# Patient Record
Sex: Male | Born: 1950 | Race: White | Hispanic: No | Marital: Married | Smoking: Never smoker
Health system: Southern US, Community
[De-identification: ages and names within clinical notes are randomized; demographics above are authoritative.]

## PROBLEM LIST (undated history)

## (undated) DIAGNOSIS — M109 Gout, unspecified: Secondary | ICD-10-CM

## (undated) DIAGNOSIS — C801 Malignant (primary) neoplasm, unspecified: Secondary | ICD-10-CM

## (undated) DIAGNOSIS — H269 Unspecified cataract: Secondary | ICD-10-CM

## (undated) DIAGNOSIS — T783XXA Angioneurotic edema, initial encounter: Secondary | ICD-10-CM

## (undated) DIAGNOSIS — I10 Essential (primary) hypertension: Secondary | ICD-10-CM

## (undated) DIAGNOSIS — T7840XA Allergy, unspecified, initial encounter: Secondary | ICD-10-CM

## (undated) DIAGNOSIS — D126 Benign neoplasm of colon, unspecified: Secondary | ICD-10-CM

## (undated) DIAGNOSIS — R7302 Impaired glucose tolerance (oral): Secondary | ICD-10-CM

## (undated) DIAGNOSIS — L509 Urticaria, unspecified: Secondary | ICD-10-CM

## (undated) DIAGNOSIS — E785 Hyperlipidemia, unspecified: Secondary | ICD-10-CM

## (undated) DIAGNOSIS — C4492 Squamous cell carcinoma of skin, unspecified: Secondary | ICD-10-CM

## (undated) DIAGNOSIS — K648 Other hemorrhoids: Secondary | ICD-10-CM

## (undated) DIAGNOSIS — G500 Trigeminal neuralgia: Secondary | ICD-10-CM

## (undated) DIAGNOSIS — K5790 Diverticulosis of intestine, part unspecified, without perforation or abscess without bleeding: Secondary | ICD-10-CM

## (undated) DIAGNOSIS — E119 Type 2 diabetes mellitus without complications: Secondary | ICD-10-CM

## (undated) DIAGNOSIS — B001 Herpesviral vesicular dermatitis: Secondary | ICD-10-CM

## (undated) HISTORY — DX: Unspecified cataract: H26.9

## (undated) HISTORY — DX: Essential (primary) hypertension: I10

## (undated) HISTORY — DX: Squamous cell carcinoma of skin, unspecified: C44.92

## (undated) HISTORY — DX: Hyperlipidemia, unspecified: E78.5

## (undated) HISTORY — DX: Allergy, unspecified, initial encounter: T78.40XA

## (undated) HISTORY — DX: Impaired glucose tolerance (oral): R73.02

## (undated) HISTORY — DX: Urticaria, unspecified: L50.9

## (undated) HISTORY — DX: Diverticulosis of intestine, part unspecified, without perforation or abscess without bleeding: K57.90

## (undated) HISTORY — DX: Other hemorrhoids: K64.8

## (undated) HISTORY — DX: Angioneurotic edema, initial encounter: T78.3XXA

## (undated) HISTORY — DX: Herpesviral vesicular dermatitis: B00.1

## (undated) HISTORY — DX: Trigeminal neuralgia: G50.0

## (undated) HISTORY — DX: Type 2 diabetes mellitus without complications: E11.9

## (undated) HISTORY — DX: Gout, unspecified: M10.9

## (undated) HISTORY — DX: Benign neoplasm of colon, unspecified: D12.6

## (undated) HISTORY — DX: Malignant (primary) neoplasm, unspecified: C80.1

---

## 2006-04-01 ENCOUNTER — Ambulatory Visit: Payer: Self-pay | Admitting: Family Medicine

## 2006-09-01 ENCOUNTER — Ambulatory Visit: Payer: Self-pay | Admitting: Family Medicine

## 2006-09-01 LAB — CONVERTED CEMR LAB
Basophils Absolute: 0 10*3/uL (ref 0.0–0.1)
Cholesterol: 219 mg/dL (ref 0–200)
HCT: 43.9 % (ref 39.0–52.0)
Hemoglobin: 15.3 g/dL (ref 13.0–17.0)
Lymphocytes Relative: 36.6 % (ref 12.0–46.0)
MCHC: 34.9 g/dL (ref 30.0–36.0)
Monocytes Absolute: 0.7 10*3/uL (ref 0.2–0.7)
Neutro Abs: 2.6 10*3/uL (ref 1.4–7.7)
Neutrophils Relative %: 47.3 % (ref 43.0–77.0)
RDW: 11.9 % (ref 11.5–14.6)
Total CHOL/HDL Ratio: 5.4

## 2007-03-08 ENCOUNTER — Ambulatory Visit: Payer: Self-pay | Admitting: Family Medicine

## 2007-03-14 LAB — CONVERTED CEMR LAB
Albumin: 4.5 g/dL (ref 3.5–5.2)
Basophils Absolute: 0 10*3/uL (ref 0.0–0.1)
Basophils Relative: 0.3 % (ref 0.0–1.0)
Basophils Relative: 0.3 % (ref 0.0–1.0)
Cholesterol: 220 mg/dL (ref 0–200)
Cholesterol: 220 mg/dL (ref 0–200)
Direct LDL: 129 mg/dL
Eosinophils Absolute: 0.1 10*3/uL (ref 0.0–0.6)
Glucose, Bld: 125 mg/dL — ABNORMAL HIGH (ref 70–99)
Glucose, Bld: 125 mg/dL — ABNORMAL HIGH (ref 70–99)
HDL: 45.9 mg/dL (ref >39.0)
Hemoglobin: 14.8 g/dL (ref 13.0–17.0)
Hemoglobin: 14.8 g/dL (ref 13.0–17.0)
Hgb A1c MFr Bld: 5.6 % (ref 4.6–6.0)
Hgb A1c MFr Bld: 5.6 % (ref 4.6–6.0)
Lymphocytes Relative: 33.6 % (ref 12.0–46.0)
MCHC: 34.6 g/dL (ref 30.0–36.0)
MCV: 95.6 fL (ref 78.0–100.0)
Monocytes Absolute: 0.6 10*3/uL (ref 0.2–0.7)
Monocytes Relative: 12.4 % — ABNORMAL HIGH (ref 3.0–11.0)
Monocytes Relative: 12.4 % — ABNORMAL HIGH (ref 3.0–11.0)
Neutro Abs: 2.6 10*3/uL (ref 1.4–7.7)
Platelets: 171 10*3/uL (ref 150–400)
RDW: 11.5 % (ref 11.5–14.6)
Total Bilirubin: 1.1 mg/dL (ref 0.3–1.2)
Total CHOL/HDL Ratio: 4.8
VLDL: 36 mg/dL (ref 0–40)
WBC: 4.9 10*3/uL (ref 4.5–10.5)

## 2007-04-18 ENCOUNTER — Encounter: Payer: Self-pay | Admitting: Family Medicine

## 2007-06-07 ENCOUNTER — Encounter: Payer: Self-pay | Admitting: Family Medicine

## 2007-06-07 ENCOUNTER — Ambulatory Visit: Payer: Self-pay

## 2008-03-21 ENCOUNTER — Ambulatory Visit: Payer: Self-pay | Admitting: Family Medicine

## 2008-03-21 LAB — CONVERTED CEMR LAB
Glucose, Urine, Semiquant: NEGATIVE
WBC Urine, dipstick: NEGATIVE
pH: 6.5

## 2008-03-22 LAB — CONVERTED CEMR LAB
ALT: 41 units/L (ref 0–53)
BUN: 16 mg/dL (ref 6–23)
Basophils Relative: 0.8 % (ref 0.0–3.0)
CO2: 28 meq/L (ref 19–32)
Calcium: 9 mg/dL (ref 8.4–10.5)
Creatinine, Ser: 0.8 mg/dL (ref 0.4–1.5)
Creatinine,U: 255.4 mg/dL
Eosinophils Relative: 3.3 % (ref 0.0–5.0)
Glucose, Bld: 131 mg/dL — ABNORMAL HIGH (ref 70–99)
Hemoglobin: 15 g/dL (ref 13.0–17.0)
Lymphocytes Relative: 33.3 % (ref 12.0–46.0)
MCHC: 34.5 g/dL (ref 30.0–36.0)
Microalb, Ur: 4.4 mg/dL — ABNORMAL HIGH (ref 0.0–1.9)
Monocytes Relative: 12.2 % — ABNORMAL HIGH (ref 3.0–12.0)
Neutro Abs: 2.3 10*3/uL (ref 1.4–7.7)
RBC: 4.44 M/uL (ref 4.22–5.81)
Total CHOL/HDL Ratio: 5.2
Total Protein: 6.8 g/dL (ref 6.0–8.3)
VLDL: 49 mg/dL — ABNORMAL HIGH (ref 0–40)
Vit D, 1,25-Dihydroxy: 22 — ABNORMAL LOW (ref 30–89)

## 2008-07-11 ENCOUNTER — Telehealth: Payer: Self-pay | Admitting: Family Medicine

## 2008-07-11 DIAGNOSIS — S298XXA Other specified injuries of thorax, initial encounter: Secondary | ICD-10-CM | POA: Insufficient documentation

## 2008-07-24 ENCOUNTER — Ambulatory Visit (HOSPITAL_COMMUNITY): Admission: RE | Admit: 2008-07-24 | Discharge: 2008-07-24 | Payer: Self-pay | Admitting: Family Medicine

## 2008-07-30 ENCOUNTER — Ambulatory Visit: Payer: Self-pay | Admitting: Family Medicine

## 2009-07-22 ENCOUNTER — Ambulatory Visit: Payer: Self-pay | Admitting: Family Medicine

## 2009-07-22 LAB — CONVERTED CEMR LAB
Bilirubin Urine: NEGATIVE
Blood in Urine, dipstick: NEGATIVE
Glucose, Urine, Semiquant: NEGATIVE
Protein, U semiquant: NEGATIVE
Specific Gravity, Urine: 1.01
pH: 6

## 2009-07-23 LAB — CONVERTED CEMR LAB
BUN: 19 mg/dL (ref 6–23)
Basophils Absolute: 0 10*3/uL (ref 0.0–0.1)
Bilirubin, Direct: 0 mg/dL (ref 0.0–0.3)
Chloride: 103 meq/L (ref 96–112)
Cholesterol: 228 mg/dL — ABNORMAL HIGH (ref 0–200)
Creatinine, Ser: 0.9 mg/dL (ref 0.4–1.5)
Direct LDL: 123.1 mg/dL
Eosinophils Absolute: 0.1 10*3/uL (ref 0.0–0.7)
Eosinophils Relative: 2.9 % (ref 0.0–5.0)
Glucose, Bld: 125 mg/dL — ABNORMAL HIGH (ref 70–99)
HCT: 44.3 % (ref 39.0–52.0)
Hgb A1c MFr Bld: 6 % (ref 4.6–6.5)
Lymphs Abs: 1.6 10*3/uL (ref 0.7–4.0)
MCHC: 33.6 g/dL (ref 30.0–36.0)
MCV: 98.7 fL (ref 78.0–100.0)
Monocytes Absolute: 0.5 10*3/uL (ref 0.1–1.0)
Neutrophils Relative %: 46.7 % (ref 43.0–77.0)
PSA: 0.58 ng/mL (ref 0.10–4.00)
Platelets: 161 10*3/uL (ref 150.0–400.0)
RDW: 12.3 % (ref 11.5–14.6)
TSH: 0.96 microintl units/mL (ref 0.35–5.50)
Total Bilirubin: 1 mg/dL (ref 0.3–1.2)
VLDL: 64.8 mg/dL — ABNORMAL HIGH (ref 0.0–40.0)
WBC: 4.1 10*3/uL — ABNORMAL LOW (ref 4.5–10.5)

## 2010-10-22 ENCOUNTER — Encounter: Payer: Self-pay | Admitting: Gastroenterology

## 2010-10-22 ENCOUNTER — Other Ambulatory Visit (INDEPENDENT_AMBULATORY_CARE_PROVIDER_SITE_OTHER): Payer: Federal, State, Local not specified - PPO | Admitting: Family Medicine

## 2010-10-22 DIAGNOSIS — Z0000 Encounter for general adult medical examination without abnormal findings: Secondary | ICD-10-CM

## 2010-10-22 LAB — BASIC METABOLIC PANEL
Calcium: 9 mg/dL (ref 8.4–10.5)
GFR: 90.73 mL/min (ref >60.00)
Glucose, Bld: 110 mg/dL — ABNORMAL HIGH (ref 70–99)
Potassium: 4.5 mEq/L (ref 3.5–5.1)
Sodium: 135 mEq/L (ref 135–145)

## 2010-10-22 LAB — CBC WITH DIFFERENTIAL/PLATELET
Basophils Absolute: 0 10*3/uL (ref 0.0–0.1)
Eosinophils Relative: 2.4 % (ref 0.0–5.0)
HCT: 42.9 % (ref 39.0–52.0)
Hemoglobin: 14.9 g/dL (ref 13.0–17.0)
Lymphocytes Relative: 28 % (ref 12.0–46.0)
Lymphs Abs: 1.6 10*3/uL (ref 0.7–4.0)
Monocytes Relative: 12.7 % — ABNORMAL HIGH (ref 3.0–12.0)
Neutro Abs: 3.3 10*3/uL (ref 1.4–7.7)
RDW: 12.4 % (ref 11.5–14.6)
WBC: 5.9 10*3/uL (ref 4.5–10.5)

## 2010-10-22 LAB — POCT URINALYSIS DIPSTICK
Glucose, UA: NEGATIVE
Leukocytes, UA: NEGATIVE
Nitrite, UA: NEGATIVE
Protein, UA: NEGATIVE
Urobilinogen, UA: 0.2

## 2010-10-22 LAB — HEMOGLOBIN A1C: Hgb A1c MFr Bld: 6 % (ref 4.6–6.5)

## 2010-10-22 LAB — HEPATIC FUNCTION PANEL
ALT: 25 U/L (ref 0–53)
AST: 21 U/L (ref 0–37)
Albumin: 4.4 g/dL (ref 3.5–5.2)
Alkaline Phosphatase: 39 U/L (ref 39–117)
Total Bilirubin: 0.6 mg/dL (ref 0.3–1.2)

## 2010-10-22 LAB — LIPID PANEL
LDL Cholesterol: 114 mg/dL — ABNORMAL HIGH (ref 0–99)
VLDL: 37 mg/dL (ref 0.0–40.0)

## 2010-10-22 LAB — PSA: PSA: 0.55 ng/mL (ref 0.10–4.00)

## 2010-10-26 ENCOUNTER — Encounter (INDEPENDENT_AMBULATORY_CARE_PROVIDER_SITE_OTHER): Payer: Self-pay | Admitting: *Deleted

## 2010-10-29 NOTE — Letter (Signed)
Summary: Colonoscopy Letter   Gastroenterology  [address] [place], [place] [address]   Phone: [phone]  Fax: [phone]      October 22, 2010 MRN: [id number]   Brett Johnson [address] [place], [place]  [address]   Dear Mr. DANGERFIELD,   According to your medical record, it is time for you to schedule a Colonoscopy. The American Cancer Society recommends this procedure as a method to detect early colon cancer. Patients with a family history of colon cancer, or a personal history of colon polyps or inflammatory bowel disease are at increased risk.  This letter has been generated based on the recommendations made at the time of your procedure. If you feel that in your particular situation this may no longer apply, please contact our office.  Please call our office at [phone] to schedule this appointment or to update your records at your earliest convenience.  Thank you for cooperating with [place] to provide you with the very best care possible.   Sincerely,  Judie Petit T. Russella Dar, M.D.  [facility] Gastroenterology Division [phone]

## 2010-11-01 HISTORY — PX: COLONOSCOPY: SHX174

## 2010-11-03 ENCOUNTER — Ambulatory Visit (AMBULATORY_SURGERY_CENTER): Payer: Federal, State, Local not specified - PPO

## 2010-11-03 VITALS — Ht 69.0 in | Wt 192.4 lb

## 2010-11-03 DIAGNOSIS — Z1211 Encounter for screening for malignant neoplasm of colon: Secondary | ICD-10-CM

## 2010-11-03 MED ORDER — PEG-KCL-NACL-NASULF-NA ASC-C 100 G PO SOLR: 1.0000 | Freq: Once | ORAL | Status: AC

## 2010-11-03 NOTE — Letter (Signed)
Summary: Pre Visit Letter Revised  [facility]  [address] [place], [place] [address]   Phone: [phone]  Fax: [phone]        10/26/2010 MRN: [id number] Brett Johnson [address] [place], [place]  [address]  [place]             Procedure Date:  11-17-10           Recall Colon---Dr. Russella Dar   Welcome to the Gastroenterology Division at [facility].    You are scheduled to see a nurse for your pre-procedure visit on 11-03-10 at 4:30p.m. on the 3rd floor at [facility], [address]  We ask that you try to arrive at our office 15 minutes prior to your appointment time to allow for check-in.  Please take a minute to review the attached form.  If you answer "Yes" to one or more of the questions on the first page, we ask that you call the person listed at your earliest opportunity.  If you answer "No" to all of the questions, please complete the rest of the form and bring it to your appointment.    Your nurse visit will consist of discussing your medical and surgical history, your immediate family medical history, and your medications.   If you are unable to list all of your medications on the form, please bring the medication bottles to your appointment and we will list them.  We will need to be aware of both prescribed and over the counter drugs.  We will need to know exact dosage information as well.    Please be prepared to read and sign documents such as consent forms, a financial agreement, and acknowledgement forms.  If necessary, and with your consent, a friend or relative is welcome to sit-in on the nurse visit with you.  Please bring your insurance card so that we may make a copy of it.  If your insurance requires a referral to see a specialist, please bring your referral form from your primary care physician.  No co-pay is required for this nurse visit.     If you cannot keep your appointment, please call [phone] to cancel or reschedule  prior to your appointment date.  This allows [place] the opportunity to schedule an appointment for another patient in need of care.    Thank you for choosing [facility] for your medical needs.  We appreciate the opportunity to care for you.  Please visit [place] at our website  to learn more about our practice.  Sincerely, The Gastroenterology Division

## 2010-11-16 ENCOUNTER — Telehealth: Payer: Self-pay

## 2010-11-16 DIAGNOSIS — E785 Hyperlipidemia, unspecified: Secondary | ICD-10-CM

## 2010-11-16 NOTE — Telephone Encounter (Signed)
Opened in error

## 2010-11-17 ENCOUNTER — Ambulatory Visit (AMBULATORY_SURGERY_CENTER): Payer: Federal, State, Local not specified - PPO | Admitting: Gastroenterology

## 2010-11-17 ENCOUNTER — Encounter: Payer: Self-pay | Admitting: Gastroenterology

## 2010-11-17 ENCOUNTER — Other Ambulatory Visit: Payer: Federal, State, Local not specified - PPO | Admitting: Gastroenterology

## 2010-11-17 VITALS — BP 129/74 | HR 56 | Temp 97.8°F | Resp 18 | Ht 69.0 in | Wt 192.0 lb

## 2010-11-17 DIAGNOSIS — Z1211 Encounter for screening for malignant neoplasm of colon: Secondary | ICD-10-CM

## 2010-11-17 DIAGNOSIS — K5730 Diverticulosis of large intestine without perforation or abscess without bleeding: Secondary | ICD-10-CM

## 2010-11-17 MED ORDER — SODIUM CHLORIDE 0.9 % IV SOLN: 500.0000 mL | INTRAVENOUS | Status: DC

## 2010-11-17 NOTE — Patient Instructions (Signed)
Follow disharge instructions. Continue with your previous medications. Increase your dietary fiber intake with liberal fluid intake. Next colonoscopy in 10 years.

## 2010-11-18 ENCOUNTER — Telehealth: Payer: Self-pay | Admitting: *Deleted

## 2010-11-18 NOTE — Telephone Encounter (Signed)

## 2011-10-28 ENCOUNTER — Ambulatory Visit: Payer: Self-pay | Admitting: Family Medicine

## 2011-12-01 ENCOUNTER — Other Ambulatory Visit (INDEPENDENT_AMBULATORY_CARE_PROVIDER_SITE_OTHER): Payer: Federal, State, Local not specified - PPO

## 2011-12-01 DIAGNOSIS — Z0000 Encounter for general adult medical examination without abnormal findings: Secondary | ICD-10-CM

## 2011-12-01 LAB — POCT URINALYSIS DIPSTICK
Glucose, UA: NEGATIVE
Leukocytes, UA: NEGATIVE
Nitrite, UA: NEGATIVE
Protein, UA: NEGATIVE
Spec Grav, UA: 1.02
Urobilinogen, UA: 0.2

## 2011-12-01 LAB — BASIC METABOLIC PANEL
BUN: 20 mg/dL (ref 6–23)
CO2: 25 mEq/L (ref 19–32)
Calcium: 8.7 mg/dL (ref 8.4–10.5)
Creatinine, Ser: 0.8 mg/dL (ref 0.4–1.5)
GFR: 101.92 mL/min (ref >60.00)
Glucose, Bld: 108 mg/dL — ABNORMAL HIGH (ref 70–99)

## 2011-12-01 LAB — CBC WITH DIFFERENTIAL/PLATELET
Basophils Absolute: 0 10*3/uL (ref 0.0–0.1)
Eosinophils Relative: 2.4 % (ref 0.0–5.0)
HCT: 42.6 % (ref 39.0–52.0)
Hemoglobin: 14.5 g/dL (ref 13.0–17.0)
Lymphocytes Relative: 36.7 % (ref 12.0–46.0)
Lymphs Abs: 1.3 10*3/uL (ref 0.7–4.0)
Monocytes Relative: 13.7 % — ABNORMAL HIGH (ref 3.0–12.0)
Platelets: 148 10*3/uL — ABNORMAL LOW (ref 150.0–400.0)
WBC: 3.6 10*3/uL — ABNORMAL LOW (ref 4.5–10.5)

## 2011-12-01 LAB — HEPATIC FUNCTION PANEL
ALT: 117 U/L — ABNORMAL HIGH (ref 0–53)
AST: 63 U/L — ABNORMAL HIGH (ref 0–37)
Alkaline Phosphatase: 36 U/L — ABNORMAL LOW (ref 39–117)
Total Bilirubin: 0.7 mg/dL (ref 0.3–1.2)

## 2011-12-01 LAB — LIPID PANEL
HDL: 50.1 mg/dL (ref >39.00)
Total CHOL/HDL Ratio: 5
VLDL: 67 mg/dL — ABNORMAL HIGH (ref 0.0–40.0)

## 2011-12-01 LAB — HEMOGLOBIN A1C: Hgb A1c MFr Bld: 5.8 % (ref 4.6–6.5)

## 2011-12-01 LAB — PSA: PSA: 0.62 ng/mL (ref 0.10–4.00)

## 2011-12-01 LAB — TSH: TSH: 0.78 u[IU]/mL (ref 0.35–5.50)

## 2012-02-18 ENCOUNTER — Other Ambulatory Visit (INDEPENDENT_AMBULATORY_CARE_PROVIDER_SITE_OTHER): Payer: Federal, State, Local not specified - PPO

## 2012-02-18 DIAGNOSIS — E785 Hyperlipidemia, unspecified: Secondary | ICD-10-CM

## 2012-02-18 LAB — HEPATIC FUNCTION PANEL
ALT: 32 U/L (ref 0–53)
Albumin: 4.4 g/dL (ref 3.5–5.2)
Bilirubin, Direct: 0.1 mg/dL (ref 0.0–0.3)
Total Protein: 6.8 g/dL (ref 6.0–8.3)

## 2012-02-18 LAB — LDL CHOLESTEROL, DIRECT: Direct LDL: 126.5 mg/dL

## 2012-02-18 LAB — LIPID PANEL
Cholesterol: 237 mg/dL — ABNORMAL HIGH (ref 0–200)
HDL: 53 mg/dL (ref >39.00)
Triglycerides: 216 mg/dL — ABNORMAL HIGH (ref 0.0–149.0)

## 2012-05-11 ENCOUNTER — Ambulatory Visit (INDEPENDENT_AMBULATORY_CARE_PROVIDER_SITE_OTHER): Payer: Federal, State, Local not specified - PPO | Admitting: Family Medicine

## 2012-05-11 DIAGNOSIS — Z23 Encounter for immunization: Secondary | ICD-10-CM

## 2012-08-02 DIAGNOSIS — M109 Gout, unspecified: Secondary | ICD-10-CM

## 2012-08-02 HISTORY — DX: Gout, unspecified: M10.9

## 2012-11-13 ENCOUNTER — Encounter: Payer: Self-pay | Admitting: Family Medicine

## 2012-11-13 ENCOUNTER — Ambulatory Visit (INDEPENDENT_AMBULATORY_CARE_PROVIDER_SITE_OTHER): Payer: Federal, State, Local not specified - PPO | Admitting: Family Medicine

## 2012-11-13 VITALS — BP 160/85 | HR 87 | Temp 98.6°F | Resp 16 | Ht 69.5 in | Wt 196.0 lb

## 2012-11-13 DIAGNOSIS — C4491 Basal cell carcinoma of skin, unspecified: Secondary | ICD-10-CM | POA: Insufficient documentation

## 2012-11-13 DIAGNOSIS — IMO0001 Reserved for inherently not codable concepts without codable children: Secondary | ICD-10-CM

## 2012-11-13 DIAGNOSIS — R030 Elevated blood-pressure reading, without diagnosis of hypertension: Secondary | ICD-10-CM | POA: Insufficient documentation

## 2012-11-13 DIAGNOSIS — G500 Trigeminal neuralgia: Secondary | ICD-10-CM

## 2012-11-13 DIAGNOSIS — Z0000 Encounter for general adult medical examination without abnormal findings: Secondary | ICD-10-CM

## 2012-11-13 DIAGNOSIS — R7302 Impaired glucose tolerance (oral): Secondary | ICD-10-CM

## 2012-11-13 LAB — COMPREHENSIVE METABOLIC PANEL
ALT: 30 U/L (ref 0–53)
AST: 22 U/L (ref 0–37)
Albumin: 4.7 g/dL (ref 3.5–5.2)
Alkaline Phosphatase: 42 U/L (ref 39–117)
BUN: 17 mg/dL (ref 6–23)
Calcium: 9.7 mg/dL (ref 8.4–10.5)
Chloride: 101 mEq/L (ref 96–112)
Creat: 0.84 mg/dL (ref 0.50–1.35)
Potassium: 3.9 mEq/L (ref 3.5–5.3)

## 2012-11-13 LAB — CBC WITH DIFFERENTIAL/PLATELET
Basophils Absolute: 0 10*3/uL (ref 0.0–0.1)
Basophils Relative: 1 % (ref 0–1)
Eosinophils Absolute: 0.1 10*3/uL (ref 0.0–0.7)
HCT: 44.2 % (ref 39.0–52.0)
Hemoglobin: 15.5 g/dL (ref 13.0–17.0)
MCH: 32 pg (ref 26.0–34.0)
MCHC: 35.1 g/dL (ref 30.0–36.0)
Monocytes Absolute: 0.8 10*3/uL (ref 0.1–1.0)
Monocytes Relative: 14 % — ABNORMAL HIGH (ref 3–12)
Neutro Abs: 2.3 10*3/uL (ref 1.7–7.7)
RDW: 13.4 % (ref 11.5–15.5)

## 2012-11-13 LAB — VITAMIN B12: Vitamin B-12: 558 pg/mL (ref 211–911)

## 2012-11-13 LAB — POCT URINALYSIS DIPSTICK
Bilirubin, UA: NEGATIVE
Glucose, UA: NEGATIVE
Leukocytes, UA: NEGATIVE
Nitrite, UA: NEGATIVE
Urobilinogen, UA: 0.2

## 2012-11-13 LAB — TSH: TSH: 1.256 u[IU]/mL (ref 0.350–4.500)

## 2012-11-13 LAB — LIPID PANEL
LDL Cholesterol: 124 mg/dL — ABNORMAL HIGH (ref 0–99)
Total CHOL/HDL Ratio: 3.9 Ratio
VLDL: 28 mg/dL (ref 0–40)

## 2012-11-13 LAB — VITAMIN D 25 HYDROXY (VIT D DEFICIENCY, FRACTURES): Vit D, 25-Hydroxy: 25 ng/mL — ABNORMAL LOW (ref 30–89)

## 2012-11-13 MED ORDER — CARBAMAZEPINE ER 100 MG PO TB12: 100.0000 mg | ORAL_TABLET | ORAL | Status: DC

## 2012-11-13 NOTE — Assessment & Plan Note (Signed)
Persistent; home BP readings running 130s/80s.  Asymptomatic.  Continue to monitor at home; RTC for readings>140/90 persistently.

## 2012-11-13 NOTE — Assessment & Plan Note (Signed)
Stable; continue with dietary modification, exercise, weight loss.

## 2012-11-13 NOTE — Assessment & Plan Note (Signed)
Anticipatory guidance --- exercise, weight loss, limitation of alcohol.  Immunizations reviewed; to call insurance regarding Zostavax coverage.  Colonoscopy UTD and hemosure negative.  Obtain labs.

## 2012-11-13 NOTE — Assessment & Plan Note (Signed)
Stable; refill of Tegretol provided.

## 2012-11-13 NOTE — Progress Notes (Signed)
[address]   [place], [place]  [address]   [phone]  Subjective:    Patient ID: Brett Johnson, male    DOB: [birth year], 62 y.o.   MRN: [id number]  HPI This 62 y.o. male presents to establish care and for CPE.  Last physical 2013.   Colonoscopy 11/17/2010.  Repeat ten years. TDAP 05/2006 [facility]. Influenza vaccine 05/11/2012. Pneumovax never. Zostavax. Eye exam.  [place]rnes.  Glasses.  No glaucoma or cataracts.  06/2012. Dental exam twice yearly.  Ortho/Collins:  L shoulder pain; s/p steroid injection.  Also had knee issue.  Fell on [employer].  Elevated blood pressure:  Home readings running 130-140/80s.  Toes cramping:  B feet with cramping.   Review of Systems  Constitutional: Negative for fever, chills, diaphoresis, activity change, appetite change, fatigue and unexpected weight change.  HENT: Negative for hearing loss, ear pain, nosebleeds, congestion, sore throat, facial swelling, rhinorrhea, sneezing, drooling, mouth sores, trouble swallowing, neck pain, neck stiffness, dental problem, voice change, postnasal drip, sinus pressure, tinnitus and ear discharge.   Eyes: Negative for photophobia, pain, discharge, redness, itching and visual disturbance.  Respiratory: Negative for apnea, cough, choking, chest tightness, shortness of breath, wheezing and stridor.   Cardiovascular: Negative for chest pain, palpitations and leg swelling.  Gastrointestinal: Negative for nausea, vomiting, abdominal pain, diarrhea, constipation, blood in stool, abdominal distention, anal bleeding and rectal pain.  Endocrine: Negative for cold intolerance, heat intolerance, polydipsia, polyphagia and polyuria.  Genitourinary: Positive for urgency and frequency. Negative for dysuria, hematuria, flank pain, decreased urine volume, discharge, penile swelling, scrotal swelling, enuresis, difficulty urinating, genital sores, penile pain and testicular pain.       Nocturia x 2.  Musculoskeletal: Positive  for arthralgias. Negative for myalgias, back pain, joint swelling and gait problem.       Knee pain; shoulder pain: R elbow pain.  Skin: Positive for color change. Negative for pallor, rash and wound.  Allergic/Immunologic: Positive for environmental allergies. Negative for food allergies and immunocompromised state.  Neurological: Negative for dizziness, tremors, seizures, syncope, facial asymmetry, speech difficulty, weakness, light-headedness, numbness and headaches.  Hematological: Negative for adenopathy. Bruises/bleeds easily.  Psychiatric/Behavioral: Positive for sleep disturbance. Negative for suicidal ideas, hallucinations, behavioral problems, confusion, self-injury, dysphoric mood, decreased concentration and agitation. The patient is not nervous/anxious and is not hyperactive.         Past Medical History  Diagnosis Date  . Neuromuscular disorder   . Glucose intolerance (impaired glucose tolerance)   . Trigeminal neuralgia   . Gout 08/02/2012    History reviewed. No pertinent past surgical history.  Prior to Admission medications   Medication Sig Start Date End Date Taking? Authorizing Provider  carbamazepine (TEGRETOL XR) 100 MG 12 hr tablet Take 1 tablet (100 mg total) by mouth every other day. 11/13/12  Yes Ethelda Chick, MD  diphenhydrAMINE (BENADRYL) 25 MG tablet Take 25 mg by mouth every 6 (six) hours as needed.     Yes Historical Provider, MD  fish oil-omega-3 fatty acids 1000 MG capsule Take 1,200 g by mouth 2 (two) times daily.     Yes Historical Provider, MD    Allergies  Allergen Reactions  . Minocycline Anaphylaxis  . Penicillins Hives    History   Social History  . Marital Status: Married    Spouse Name: N/A    Number of Children: N/A  . Years of Education: N/A   Occupational History  . Not on file.   Social History  Main Topics  . Smoking status: Never Smoker   . Smokeless tobacco: Never Used  . Alcohol Use: 7.0 oz/week    14 drink(s) per week    . Drug Use: No  . Sexually Active: Yes -- Male partner(s)   Other Topics Concern  . Not on file   Social History Narrative   Marital status:  Married x 35 years.      Children:  1 child; none grandchildren.      Employment: retired      Tobacco: none      Alcohol:  3 glasses of wine daily; trying not to drink every day.      Drugs: none      Exercising: walking 40 minutes daily      Seatbelt: 100%      Guns:  Loaded partially secured; no children in house.      Sunscreen:  SPF 30-50.      Dermatologist:  [facility]   Gastroenterologist:  Jake MichaelisThomasena Edis   OphthalmChristophe Louis    Family History  Problem Relation Age of Onset  . Colon polyps Father   . Diabetes Father   . Arthritis Father   . Pulmonary embolism Mother   . Heart disease Maternal Grandfather   . Diabetes Brother     Objective:   Physical Exam  Nursing note and vitals reviewed. Constitutional: He is oriented to person, place, and time. He appears well-developed and well-nourished. No distress.  HENT:  Head: Normocephalic and atraumatic.  Right Ear: External ear normal.  Left Ear: External ear normal.  Nose: Nose normal.  Mouth/Throat: Oropharynx is clear and moist.  Eyes: Conjunctivae and EOM are normal. Pupils are equal, round, and reactive to light.  Neck: Normal range of motion. Neck supple. No JVD present. No tracheal deviation present. No thyromegaly present.  Cardiovascular: Normal rate, regular rhythm, normal heart sounds and intact distal pulses.  Exam reveals no gallop and no friction rub.   No murmur heard. Pulmonary/Chest: Effort normal and breath sounds normal. He has no wheezes. He has no rales.  Abdominal: Soft. Bowel sounds are normal. He exhibits no distension and no mass. There is no tenderness. There is no rebound and no guarding. Hernia confirmed negative in the right inguinal area and confirmed negative in the left inguinal area.  Genitourinary: Rectum normal, prostate normal,  testes normal and penis normal. Right testis shows no mass, no swelling and no tenderness. Left testis shows no mass, no swelling and no tenderness. No penile tenderness.  Musculoskeletal:       Right shoulder: Normal.       Left shoulder: Normal.       Right elbow: He exhibits swelling.       Cervical back: Normal.  Lymphadenopathy:    He has no cervical adenopathy.       Right: No inguinal adenopathy present.       Left: No inguinal adenopathy present.  Neurological: He is alert and oriented to person, place, and time. He has normal reflexes. No cranial nerve deficit. He exhibits normal muscle tone. Coordination normal.  Skin: Skin is warm and dry. No rash noted. He is not diaphoretic.  Diffuse sun related changes back, upper extremities.  Psychiatric: He has a normal mood and affect. His behavior is normal. Judgment and thought content normal.   EKG:  NSR; no ST changes.  Results for orders placed in visit on 11/13/12  POCT URINALYSIS DIPSTICK  Result Value Range   Color, UA yellow     Clarity, UA clear     Glucose, UA neg     Bilirubin, UA neg     Ketones, UA neg     Spec Grav, UA 1.020     Blood, UA neg     pH, UA 6.0     Protein, UA neg     Urobilinogen, UA 0.2     Nitrite, UA neg     Leukocytes, UA Negative    IFOBT (OCCULT BLOOD)      Result Value Range   IFOBT Negative         Assessment & Plan:  Annual physical exam - Plan: POCT urinalysis dipstick, Comprehensive metabolic panel, CBC with Differential, PSA, Lipid panel, TSH, Vitamin B12, Vitamin D 25 hydroxy, Folate, EKG 12-Lead, Hemoglobin A1c, IFOBT POC (occult bld, rslt in office), CANCELED: POCT glycosylated hemoglobin (Hb A1C)  Trigeminal neuralgia  Blood pressure elevated  Glucose intolerance (impaired glucose tolerance)   Meds ordered this encounter  Medications  . carbamazepine (TEGRETOL XR) 100 MG 12 hr tablet    Sig: Take 1 tablet (100 mg total) by mouth every other day.    Dispense:  30  tablet    Refill:  11

## 2012-11-13 NOTE — Patient Instructions (Addendum)
Annual physical exam - Plan: POCT urinalysis dipstick, Comprehensive metabolic panel, CBC with Differential, PSA, Lipid panel, TSH, Vitamin B12, Vitamin D 25 hydroxy, Folate, EKG 12-Lead, Hemoglobin A1c, CANCELED: POCT glycosylated hemoglobin (Hb A1C)   1.  CHECK BLOOD PRESSURE MONTHLY. 2.  CALL INSURANCE REGARDING SHINGLES VACCINE COVERAGE (ZOSTAVAX).

## 2012-11-13 NOTE — Assessment & Plan Note (Signed)
Stable; followed by [facility] annually in 03/2012.

## 2012-11-16 ENCOUNTER — Telehealth: Payer: Self-pay

## 2012-11-16 NOTE — Telephone Encounter (Signed)
Pt had a CPE with Dr. Katrinka Blazing on Monday and he had a missed call from someone. He is wanting someone to give him a call back. Call back number is [phone], he said that you can leave message on voicemail if he doesn't answer.

## 2012-11-17 NOTE — Telephone Encounter (Signed)
Call -1. Blood sugar elevated at 134 and HgbA1c just in prediabetic range; recommend exercise, weight loss, limiting sugar intake. 2. Liver and kidney functions normal. 3. No evidence of anemia. 4. PSA level normal at 0.76. 5. Cholesterol slightly elevated but improved from last year. 6. Thyroid function normal. 7. Vitamin B12 level normal. 8. Vitamin D level slightly low at 25; recommend increasing Vitamin D supplementation by additional 800 units daily. 8. Urine normal. 9. No evidence of blood in stool. Patient advised.

## 2013-03-05 ENCOUNTER — Telehealth: Payer: Self-pay

## 2013-03-05 MED ORDER — ZOSTER VACCINE LIVE 19400 UNT/0.65ML ~~LOC~~ SOLR: 0.6500 mL | Freq: Once | SUBCUTANEOUS | Status: DC

## 2013-03-05 NOTE — Telephone Encounter (Signed)
OK to call [facility] with a verbal order for Zostavax to be administered by pharmacist.

## 2013-03-05 NOTE — Telephone Encounter (Signed)
done

## 2013-03-05 NOTE — Telephone Encounter (Signed)
PT REQUESTING FAXED ORDER FOR SHINGLES VACCI NE TO [facility]   BEST PHONE FOR PT IS [phone]

## 2013-04-23 ENCOUNTER — Ambulatory Visit (INDEPENDENT_AMBULATORY_CARE_PROVIDER_SITE_OTHER): Payer: Federal, State, Local not specified - PPO | Admitting: Family Medicine

## 2013-04-23 DIAGNOSIS — Z23 Encounter for immunization: Secondary | ICD-10-CM

## 2013-09-27 ENCOUNTER — Other Ambulatory Visit: Payer: Self-pay | Admitting: Family Medicine

## 2013-10-09 ENCOUNTER — Telehealth: Payer: Self-pay | Admitting: Family Medicine

## 2013-10-09 MED ORDER — VALACYCLOVIR HCL 1 G PO TABS: 2000.0000 mg | ORAL_TABLET | Freq: Two times a day (BID) | ORAL | Status: DC

## 2013-10-09 NOTE — Telephone Encounter (Signed)
I sent script e-scribe. 

## 2013-10-09 NOTE — Telephone Encounter (Signed)
[id number] - [place], [place] - [address] requesting re-fill of valACYclovir (VALTREX) 1000 MG tablet

## 2013-11-19 ENCOUNTER — Encounter: Payer: Federal, State, Local not specified - PPO | Admitting: Family Medicine

## 2013-12-19 ENCOUNTER — Encounter: Payer: Federal, State, Local not specified - PPO | Admitting: Family Medicine

## 2014-01-02 ENCOUNTER — Encounter: Payer: Self-pay | Admitting: Family Medicine

## 2014-01-02 ENCOUNTER — Ambulatory Visit (INDEPENDENT_AMBULATORY_CARE_PROVIDER_SITE_OTHER): Payer: Federal, State, Local not specified - PPO | Admitting: Family Medicine

## 2014-01-02 VITALS — BP 140/85 | HR 78 | Temp 97.5°F | Resp 16 | Ht 68.0 in | Wt 197.0 lb

## 2014-01-02 DIAGNOSIS — Z0000 Encounter for general adult medical examination without abnormal findings: Secondary | ICD-10-CM

## 2014-01-02 DIAGNOSIS — R030 Elevated blood-pressure reading, without diagnosis of hypertension: Secondary | ICD-10-CM

## 2014-01-02 DIAGNOSIS — M545 Low back pain, unspecified: Secondary | ICD-10-CM

## 2014-01-02 DIAGNOSIS — C4491 Basal cell carcinoma of skin, unspecified: Secondary | ICD-10-CM

## 2014-01-02 DIAGNOSIS — IMO0001 Reserved for inherently not codable concepts without codable children: Secondary | ICD-10-CM

## 2014-01-02 DIAGNOSIS — G500 Trigeminal neuralgia: Secondary | ICD-10-CM

## 2014-01-02 DIAGNOSIS — R7309 Other abnormal glucose: Secondary | ICD-10-CM

## 2014-01-02 DIAGNOSIS — Z125 Encounter for screening for malignant neoplasm of prostate: Secondary | ICD-10-CM

## 2014-01-02 DIAGNOSIS — E7800 Pure hypercholesterolemia, unspecified: Secondary | ICD-10-CM

## 2014-01-02 LAB — COMPLETE METABOLIC PANEL WITH GFR
ALT: 29 U/L (ref 0–53)
AST: 20 U/L (ref 0–37)
Albumin: 4.8 g/dL (ref 3.5–5.2)
Alkaline Phosphatase: 40 U/L (ref 39–117)
BILIRUBIN TOTAL: 0.7 mg/dL (ref 0.2–1.2)
BUN: 14 mg/dL (ref 6–23)
CALCIUM: 9.6 mg/dL (ref 8.4–10.5)
CHLORIDE: 101 meq/L (ref 96–112)
CO2: 26 mEq/L (ref 19–32)
CREATININE: 0.9 mg/dL (ref 0.50–1.35)
GFR, Est African American: >89 mL/min
GFR, Est Non African American: >89 mL/min
Glucose, Bld: 134 mg/dL — ABNORMAL HIGH (ref 70–99)
Potassium: 4.1 mEq/L (ref 3.5–5.3)
SODIUM: 137 meq/L (ref 135–145)
Total Protein: 7.3 g/dL (ref 6.0–8.3)

## 2014-01-02 LAB — CBC WITH DIFFERENTIAL/PLATELET
BASOS ABS: 0 10*3/uL (ref 0.0–0.1)
Basophils Relative: 0 % (ref 0–1)
EOS PCT: 2 % (ref 0–5)
Eosinophils Absolute: 0.1 10*3/uL (ref 0.0–0.7)
HCT: 44.1 % (ref 39.0–52.0)
Hemoglobin: 15.4 g/dL (ref 13.0–17.0)
LYMPHS PCT: 39 % (ref 12–46)
Lymphs Abs: 2 10*3/uL (ref 0.7–4.0)
MCH: 32 pg (ref 26.0–34.0)
MCHC: 34.9 g/dL (ref 30.0–36.0)
MCV: 91.5 fL (ref 78.0–100.0)
Monocytes Absolute: 0.6 10*3/uL (ref 0.1–1.0)
Monocytes Relative: 12 % (ref 3–12)
NEUTROS ABS: 2.4 10*3/uL (ref 1.7–7.7)
NEUTROS PCT: 47 % (ref 43–77)
PLATELETS: 181 10*3/uL (ref 150–400)
RBC: 4.82 MIL/uL (ref 4.22–5.81)
RDW: 13.6 % (ref 11.5–15.5)
WBC: 5 10*3/uL (ref 4.0–10.5)

## 2014-01-02 LAB — POCT URINALYSIS DIPSTICK
BILIRUBIN UA: NEGATIVE
Blood, UA: NEGATIVE
Glucose, UA: NEGATIVE
KETONES UA: NEGATIVE
LEUKOCYTES UA: NEGATIVE
Nitrite, UA: NEGATIVE
SPEC GRAV UA: 1.015
Urobilinogen, UA: 0.2
pH, UA: 6.5

## 2014-01-02 LAB — TSH: TSH: 1.748 u[IU]/mL (ref 0.350–4.500)

## 2014-01-02 LAB — HEMOGLOBIN A1C
Hgb A1c MFr Bld: 6.2 % — ABNORMAL HIGH (ref <5.7)
MEAN PLASMA GLUCOSE: 131 mg/dL — AB (ref <117)

## 2014-01-02 LAB — LIPID PANEL
CHOLESTEROL: 214 mg/dL — AB (ref 0–200)
HDL: 50 mg/dL (ref >39)
LDL Cholesterol: 111 mg/dL — ABNORMAL HIGH (ref 0–99)
TRIGLYCERIDES: 267 mg/dL — AB (ref <150)
Total CHOL/HDL Ratio: 4.3 Ratio
VLDL: 53 mg/dL — AB (ref 0–40)

## 2014-01-02 MED ORDER — CARBAMAZEPINE ER 100 MG PO TB12: 100.0000 mg | ORAL_TABLET | ORAL | Status: DC

## 2014-01-02 NOTE — Patient Instructions (Signed)

## 2014-01-02 NOTE — Progress Notes (Signed)
Subjective:    Patient ID: Brett Johnson, male    DOB: [birth year], 63 y.o.   MRN: [id number]  01/02/2014  Annual Exam   HPI This 63 y.o. male presents for Complete Physical Examination.  Last CPE 11/13/12. Colonoscopy 11/17/10.  Normal. Repeat 10 years.  [place] Plan.   TDAP 05/2006 [facility]. Influenza vaccine 04/23/13. Zostavax 03/05/2013. Eye exam 07/2013.  [place].  No glaucoma or cataracts. Dental exam every six months.  Last dermatology exam 03/2013 with [facility].  Intermittent low back pain.  No radation, n/t/w.  No saddle paresthesias or b/b dysfunction.  Home BP readings running 120-140/80-90.  Review of Systems  Constitutional: Negative.   HENT: Negative.   Eyes: Negative.   Respiratory: Negative.   Cardiovascular: Negative.   Gastrointestinal: Negative.   Endocrine: Negative.   Genitourinary: Negative.   Musculoskeletal: Positive for back pain. Negative for gait problem, joint swelling, myalgias, neck pain and neck stiffness.  Skin: Negative.   Allergic/Immunologic: Negative.   Neurological: Negative.  Negative for weakness and numbness.  Psychiatric/Behavioral: Positive for sleep disturbance. Negative for dysphoric mood and decreased concentration. The patient is not nervous/anxious.     Past Medical History  Diagnosis Date  . Neuromuscular disorder   . Glucose intolerance (impaired glucose tolerance)   . Trigeminal neuralgia   . Gout 08/02/2012  . Cancer     Basal cell cancer; followed annually by dermatology/[facility].  Marland Kitchen Herpes labialis    Past Surgical History  Procedure Laterality Date  . Colonoscopy  11/01/2010    normal; repeat in 10 years.  [facility].     Allergies  Allergen Reactions  . Minocycline Anaphylaxis  . Penicillins Hives   Current Outpatient Prescriptions  Medication Sig Dispense Refill  . aspirin 81 MG tablet Take 81 mg by mouth daily.      . carbamazepine (TEGRETOL XR) 100 MG 12 hr tablet Take 1 tablet (100 mg total) by mouth every other  day.  30 tablet  11  . diphenhydrAMINE (BENADRYL) 25 MG tablet Take 25 mg by mouth at bedtime as needed for sleep.       . fish oil-omega-3 fatty acids 1000 MG capsule Take 1,200 g by mouth 2 (two) times daily.        . valACYclovir (VALTREX) 1000 MG tablet Take 2 tablets (2,000 mg total) by mouth 2 (two) times daily.  120 tablet  6   No current facility-administered medications for this visit.   History   Social History  . Marital Status: Married    Spouse Name: N/A    Number of Children: N/A  . Years of Education: N/A   Occupational History  . Not on file.   Social History Main Topics  . Smoking status: Never Smoker   . Smokeless tobacco: Never Used  . Alcohol Use: 7.0 oz/week    14 drink(s) per week  . Drug Use: No  . Sexual Activity: Yes    Partners: Female   Other Topics Concern  . Not on file   Social History Narrative   Marital status:  Married x 37 years.      Children:  1 child; 1 grandchildren.      Employment: retired in 2004; 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        Tobacco: none      Alcohol:  2-3 glasses of wine daily; trying not to drink every day.        Drugs: none      Exercising: walking 40  minutes daily      Seatbelt: 100%      Guns:  Loaded partially secured; no children in house.      Sunscreen:  SPF 30-50.      Dermatologist:  Ubaldo Glassing   Gastroenterologist:  [facility].   OrthoTheda Sers   OphthalmThom Chimes   Family History  Problem Relation Age of Onset  . Colon polyps Father   . Diabetes Father   . Arthritis Father   . Heart disease Father     valve replacement.  . Pulmonary embolism Mother   . Heart disease Maternal Grandfather   . Diabetes Brother        Objective:    BP 140/85  Pulse 78  Temp(Src) 97.5 F (36.4 C)  Resp 16  Ht 5\' 8"  (1.727 m)  Wt 197 lb (89.359 kg)  BMI 29.96 kg/m2  SpO2 96% Physical Exam  Constitutional: He is oriented to person, place, and time. He appears well-developed and well-nourished. No distress.    HENT:  Head: Normocephalic and atraumatic.  Right Ear: External ear normal.  Left Ear: External ear normal.  Nose: Nose normal.  Mouth/Throat: Oropharynx is clear and moist.  Eyes: Conjunctivae and EOM are normal. Pupils are equal, round, and reactive to light.  Neck: Normal range of motion. Neck supple. Carotid bruit is not present. No thyromegaly present.  Cardiovascular: Normal rate, regular rhythm, normal heart sounds and intact distal pulses.  Exam reveals no gallop and no friction rub.   No murmur heard. Pulmonary/Chest: Effort normal and breath sounds normal. He has no wheezes. He has no rales.  Abdominal: Soft. Bowel sounds are normal. He exhibits no distension and no mass. There is no tenderness. There is no rebound and no guarding. Hernia confirmed negative in the right inguinal area and confirmed negative in the left inguinal area.  Genitourinary: Prostate normal, testes normal and penis normal. Rectal exam shows external hemorrhoid. Prostate is not enlarged. Right testis shows no mass, no swelling and no tenderness. Right testis is descended. Left testis shows no mass, no swelling and no tenderness. Left testis is descended. Circumcised.  Musculoskeletal:       Right shoulder: Normal.       Left shoulder: Normal.       Cervical back: Normal.  Lymphadenopathy:    He has no cervical adenopathy.       Right: No inguinal adenopathy present.       Left: No inguinal adenopathy present.  Neurological: He is alert and oriented to person, place, and time. He has normal reflexes. No cranial nerve deficit. He exhibits normal muscle tone. Coordination normal.  Skin: Skin is warm and dry. No rash noted. He is not diaphoretic.  Psychiatric: He has a normal mood and affect. His behavior is normal. Judgment and thought content normal.   Results for orders placed in visit on 01/02/14  POCT URINALYSIS DIPSTICK      Result Value Ref Range   Color, UA yellow     Clarity, UA clear     Glucose,  UA neg     Bilirubin, UA neg     Ketones, UA neg     Spec Grav, UA 1.015     Blood, UA neg     pH, UA 6.5     Protein, UA trace     Urobilinogen, UA 0.2     Nitrite, UA neg     Leukocytes, UA Negative     EKG: NSR  Assessment & Plan:  Annual physical exam - Plan: CBC with Differential, COMPLETE METABOLIC PANEL WITH GFR, TSH, Hemoglobin A1c, Lipid panel, PSA, POCT urinalysis dipstick, EKG 12-Lead  Other abnormal glucose - Plan: Hemoglobin A1c  Pure hypercholesterolemia - Plan: Lipid panel  Special screening for malignant neoplasm of prostate - Plan: PSA  Low back pain  Blood pressure elevated  Trigeminal neuralgia  Basal cell carcinoma  1.  Complete physical exam: anticipatory guidance --- weight loss, exercise.  Immunizations UTD. Colonoscopy UTD.   2.  Glucose intolerance: stable; obtain labs. 3.  Hyperlipidemia: stable; obtain labs;continue with dietary modification. 4.  Screening prostate cancer: DRE and PSA performed. 5.  Blood pressure elevated: stable; no indication for medication at this tiime. 6.  Trigeminal neuralgia: controlled; obtain labs; refill provided. 7. Basal cell carcinoma: stable; followed by [facility] yearly. 8. Low back pain/strain: New.  Home exercise program provided to perform daily; RTC if develops daily pain.  Meds ordered this encounter  Medications  . carbamazepine (TEGRETOL XR) 100 MG 12 hr tablet    Sig: Take 1 tablet (100 mg total) by mouth every other day.    Dispense:  30 tablet    Refill:  11    Return in about 1 year (around 01/03/2015) for complete physical examiniation.     Reginia Forts, M.D.  [facility] [address] [place], [place]  [address] [phone] phone [phone] fax

## 2014-01-03 ENCOUNTER — Encounter: Payer: Self-pay | Admitting: Family Medicine

## 2014-01-03 LAB — PSA: PSA: 0.69 ng/mL (ref <=4.00)

## 2014-05-06 ENCOUNTER — Ambulatory Visit (INDEPENDENT_AMBULATORY_CARE_PROVIDER_SITE_OTHER): Payer: Federal, State, Local not specified - PPO

## 2014-05-06 DIAGNOSIS — Z23 Encounter for immunization: Secondary | ICD-10-CM

## 2014-05-30 NOTE — Care Management Note (Addendum)
    Page 1 of 1   05/29/2014     12:40:17 PM CARE MANAGEMENT NOTE 05/29/2014  Patient:  Brett Johnson, Brett Johnson   Account Number:  [id number]  Date Initiated:  05/29/2014  Documentation initiated by:  [facility]  Subjective/Objective Assessment:   adm: enteroscopy procedure due to hemoccult positive stools and iron  deficiency anemia     Action/Plan:   SNF at discharge   Anticipated DC Date:     Anticipated DC Plan:           Choice offered to / List presented to:             Status of service:   Medicare Important Message given?   (If response is "NO", the following Medicare IM given date fields will be blank) Date Medicare IM given:   Medicare IM given by:   Date Additional Medicare IM given:   Additional Medicare IM given by:    Discharge Disposition:  [place]  Per UR Regulation:    If discussed at Long Length of Stay Meetings, dates discussed:    Comments:  05/29/14 12:30 CM notes pt to go to [facility] upon discharge; [place] arranging.  No other CM needs were communicated.  Mariane Masters, BSN, [place].

## 2014-11-06 ENCOUNTER — Encounter: Payer: Federal, State, Local not specified - PPO | Admitting: Family Medicine

## 2014-11-13 ENCOUNTER — Encounter: Payer: Self-pay | Admitting: Family Medicine

## 2014-11-13 ENCOUNTER — Ambulatory Visit (INDEPENDENT_AMBULATORY_CARE_PROVIDER_SITE_OTHER): Payer: Federal, State, Local not specified - PPO | Admitting: Family Medicine

## 2014-11-13 VITALS — BP 154/80 | HR 67 | Temp 97.7°F | Resp 16 | Ht 68.5 in | Wt 197.0 lb

## 2014-11-13 DIAGNOSIS — Z125 Encounter for screening for malignant neoplasm of prostate: Secondary | ICD-10-CM

## 2014-11-13 DIAGNOSIS — G500 Trigeminal neuralgia: Secondary | ICD-10-CM

## 2014-11-13 DIAGNOSIS — IMO0001 Reserved for inherently not codable concepts without codable children: Secondary | ICD-10-CM

## 2014-11-13 DIAGNOSIS — Z0000 Encounter for general adult medical examination without abnormal findings: Secondary | ICD-10-CM | POA: Diagnosis not present

## 2014-11-13 DIAGNOSIS — E7800 Pure hypercholesterolemia, unspecified: Secondary | ICD-10-CM

## 2014-11-13 DIAGNOSIS — Z85828 Personal history of other malignant neoplasm of skin: Secondary | ICD-10-CM | POA: Diagnosis not present

## 2014-11-13 DIAGNOSIS — R030 Elevated blood-pressure reading, without diagnosis of hypertension: Secondary | ICD-10-CM

## 2014-11-13 DIAGNOSIS — R7302 Impaired glucose tolerance (oral): Secondary | ICD-10-CM

## 2014-11-13 LAB — LIPID PANEL
CHOL/HDL RATIO: 4.5 ratio
CHOLESTEROL: 210 mg/dL — AB (ref 0–200)
HDL: 47 mg/dL (ref >=40)
LDL CALC: 129 mg/dL — AB (ref 0–99)
TRIGLYCERIDES: 171 mg/dL — AB (ref <150)
VLDL: 34 mg/dL (ref 0–40)

## 2014-11-13 LAB — CBC WITH DIFFERENTIAL/PLATELET
Basophils Absolute: 0 10*3/uL (ref 0.0–0.1)
Basophils Relative: 0 % (ref 0–1)
EOS ABS: 0.1 10*3/uL (ref 0.0–0.7)
Eosinophils Relative: 2 % (ref 0–5)
HEMATOCRIT: 44.6 % (ref 39.0–52.0)
Hemoglobin: 15.7 g/dL (ref 13.0–17.0)
Lymphocytes Relative: 39 % (ref 12–46)
Lymphs Abs: 1.8 10*3/uL (ref 0.7–4.0)
MCH: 32.7 pg (ref 26.0–34.0)
MCHC: 35.2 g/dL (ref 30.0–36.0)
MCV: 92.9 fL (ref 78.0–100.0)
MONOS PCT: 13 % — AB (ref 3–12)
MPV: 9.8 fL (ref 8.6–12.4)
Monocytes Absolute: 0.6 10*3/uL (ref 0.1–1.0)
Neutro Abs: 2.1 10*3/uL (ref 1.7–7.7)
Neutrophils Relative %: 46 % (ref 43–77)
Platelets: 153 10*3/uL (ref 150–400)
RBC: 4.8 MIL/uL (ref 4.22–5.81)
RDW: 13.6 % (ref 11.5–15.5)
WBC: 4.6 10*3/uL (ref 4.0–10.5)

## 2014-11-13 LAB — COMPREHENSIVE METABOLIC PANEL
ALT: 28 U/L (ref 0–53)
AST: 19 U/L (ref 0–37)
Albumin: 4.6 g/dL (ref 3.5–5.2)
Alkaline Phosphatase: 37 U/L — ABNORMAL LOW (ref 39–117)
BILIRUBIN TOTAL: 0.6 mg/dL (ref 0.2–1.2)
BUN: 20 mg/dL (ref 6–23)
CO2: 24 mEq/L (ref 19–32)
CREATININE: 0.77 mg/dL (ref 0.50–1.35)
Calcium: 9.3 mg/dL (ref 8.4–10.5)
Chloride: 104 mEq/L (ref 96–112)
GLUCOSE: 134 mg/dL — AB (ref 70–99)
Potassium: 4.5 mEq/L (ref 3.5–5.3)
SODIUM: 138 meq/L (ref 135–145)
Total Protein: 6.8 g/dL (ref 6.0–8.3)

## 2014-11-13 LAB — HEMOGLOBIN A1C
Hgb A1c MFr Bld: 6.4 % — ABNORMAL HIGH (ref <5.7)
Mean Plasma Glucose: 137 mg/dL — ABNORMAL HIGH (ref <117)

## 2014-11-13 LAB — POCT URINALYSIS DIPSTICK
BILIRUBIN UA: NEGATIVE
Blood, UA: NEGATIVE
Glucose, UA: NEGATIVE
KETONES UA: NEGATIVE
LEUKOCYTES UA: NEGATIVE
Nitrite, UA: NEGATIVE
PROTEIN UA: NEGATIVE
SPEC GRAV UA: 1.01
Urobilinogen, UA: 0.2
pH, UA: 5.5

## 2014-11-13 LAB — TSH: TSH: 1.336 u[IU]/mL (ref 0.350–4.500)

## 2014-11-13 MED ORDER — CARBAMAZEPINE ER 100 MG PO TB12: 100.0000 mg | ORAL_TABLET | ORAL | Status: DC

## 2014-11-13 NOTE — Patient Instructions (Signed)

## 2014-11-13 NOTE — Progress Notes (Signed)
Subjective:    Patient ID: Brett Johnson, male    DOB: 05/05/1951, 64 y.o.   MRN: [id number]  11/13/2014  Annual Exam   HPI This 64 y.o. male presents for Complete Physical Examination.   Last physical:  01-02-2014 Colonoscopy:  11-17-2010; repeat in ten years.  Normal.   TDAP:   2007 Pneumovax:  never Zostavax:  03-05-2013 Influenza:  05-06-2014 Eye exam:  06/2014; no g/c.  +glasses Dental exam:  Every six months.    Elevated blood pressure: home readings running 120-140/70-90.     Review of Systems  Constitutional: Negative for fever, chills, diaphoresis, activity change, appetite change, fatigue and unexpected weight change.  HENT: Positive for congestion. Negative for dental problem, drooling, ear discharge, ear pain, facial swelling, hearing loss, mouth sores, nosebleeds, postnasal drip, rhinorrhea, sinus pressure, sneezing, sore throat, tinnitus, trouble swallowing and voice change.   Eyes: Negative for photophobia, pain, discharge, redness, itching and visual disturbance.  Respiratory: Negative for apnea, cough, choking, chest tightness, shortness of breath, wheezing and stridor.   Cardiovascular: Negative for chest pain, palpitations and leg swelling.  Gastrointestinal: Negative for nausea, vomiting, abdominal pain, diarrhea, constipation and blood in stool.  Endocrine: Negative for cold intolerance, heat intolerance, polydipsia, polyphagia and polyuria.  Genitourinary: Negative for dysuria, urgency, frequency, hematuria, flank pain, decreased urine volume, discharge, penile swelling, scrotal swelling, enuresis, difficulty urinating, genital sores, penile pain and testicular pain.  Musculoskeletal: Positive for back pain. Negative for myalgias, joint swelling, arthralgias, gait problem, neck pain and neck stiffness.  Skin: Negative for color change, pallor, rash and wound.  Allergic/Immunologic: Negative for environmental allergies, food allergies and immunocompromised  state.  Neurological: Negative for dizziness, tremors, seizures, syncope, facial asymmetry, speech difficulty, weakness, light-headedness, numbness and headaches.  Hematological: Negative for adenopathy. Does not bruise/bleed easily.  Psychiatric/Behavioral: Negative for suicidal ideas, hallucinations, behavioral problems, confusion, sleep disturbance, self-injury, dysphoric mood, decreased concentration and agitation. The patient is not nervous/anxious and is not hyperactive.     Past Medical History  Diagnosis Date  . Glucose intolerance (impaired glucose tolerance)   . Gout 08/02/2012  . Cancer     Basal cell cancer; followed annually by dermatology/[facility].  Marland Kitchen Herpes labialis   . Trigeminal neuralgia of left side of face    Past Surgical History  Procedure Laterality Date  . Colonoscopy  11/01/2010    normal; repeat in 10 years.  [facility].   Allergies  Allergen Reactions  . Minocycline Anaphylaxis  . Penicillins Hives   Current Outpatient Prescriptions  Medication Sig Dispense Refill  . aspirin 81 MG tablet Take 81 mg by mouth daily.    . carbamazepine (TEGRETOL XR) 100 MG 12 hr tablet Take 1 tablet (100 mg total) by mouth every other day. 90 tablet 3  . CINNAMON PO Take by mouth daily.    . diphenhydrAMINE (BENADRYL) 25 MG tablet Take 25 mg by mouth at bedtime as needed for sleep.     . fish oil-omega-3 fatty acids 1000 MG capsule Take 1,200 g by mouth 2 (two) times daily.      Marland Kitchen MILK THISTLE PO Take by mouth daily.     . Multiple Vitamin (MULTIVITAMIN) tablet Take 1 tablet by mouth daily.    . Omega-3 Fatty Acids (FISH OIL PO) Take by mouth daily.    . valACYclovir (VALTREX) 1000 MG tablet Take 2 tablets (2,000 mg total) by mouth 2 (two) times daily. 120 tablet 6   No current facility-administered medications for  this visit.   History   Social History  . Marital Status: Married    Spouse Name: N/A  . Number of Children: N/A  . Years of Education: N/A   Occupational  History  . Not on file.   Social History Main Topics  . Smoking status: Never Smoker   . Smokeless tobacco: Never Used  . Alcohol Use: 7.0 oz/week    14 Standard drinks or equivalent per week  . Drug Use: No  . Sexual Activity:    Partners: Female   Other Topics Concern  . Not on file   Social History Narrative   Marital status:  Married x 38 years.      Children:  1 child; 1 grandchildren.      Employment: retired in 2004; 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  40 -50 hours per week.      Tobacco: none      Alcohol:  2-3 glasses of wine daily; trying not to drink every day.        Drugs: none      Exercising: walking 40 minutes daily.      Seatbelt: 100%      Guns:  Loaded partially secured; no children in house.      Sunscreen:  SPF 30-50.      Dermatologist:  Ubaldo Glassing   Gastroenterologist:  [facility].   OrthoTheda Sers   OphthalmThom Chimes   Family History  Problem Relation Age of Onset  . Colon polyps Father   . Diabetes Father   . Arthritis Father   . Heart disease Father     valve replacement.  . Pulmonary embolism Mother   . Heart disease Maternal Grandfather   . Diabetes Brother   . Hypertension Brother         Objective:    BP 154/80 mmHg  Pulse 67  Temp(Src) 97.7 F (36.5 C) (Oral)  Resp 16  Ht 5' 8.5" (1.74 m)  Wt 197 lb (89.359 kg)  BMI 29.51 kg/m2  SpO2 98% Physical Exam  Constitutional: He is oriented to person, place, and time. He appears well-developed and well-nourished. No distress.  HENT:  Head: Normocephalic and atraumatic.  Right Ear: External ear normal.  Left Ear: External ear normal.  Nose: Nose normal.  Mouth/Throat: Oropharynx is clear and moist.  Eyes: Conjunctivae and EOM are normal. Pupils are equal, round, and reactive to light.  Neck: Normal range of motion. Neck supple. Carotid bruit is not present. No thyromegaly present.  Cardiovascular: Normal rate, regular rhythm, normal heart sounds and intact distal pulses.  Exam reveals  no gallop and no friction rub.   No murmur heard. Pulmonary/Chest: Effort normal and breath sounds normal. He has no wheezes. He has no rales.  Abdominal: Soft. Bowel sounds are normal. He exhibits no distension and no mass. There is no tenderness. There is no rebound and no guarding. Hernia confirmed negative in the right inguinal area and confirmed negative in the left inguinal area.  Genitourinary: Rectum normal, prostate normal, testes normal and penis normal. Prostate is not enlarged and not tender. Right testis shows no mass, no swelling and no tenderness. Left testis shows no mass, no swelling and no tenderness. Circumcised.  Musculoskeletal:       Right shoulder: Normal.       Left shoulder: Normal.       Cervical back: Normal.  Lymphadenopathy:    He has no cervical adenopathy.       Right: No inguinal adenopathy present.  Left: No inguinal adenopathy present.  Neurological: He is alert and oriented to person, place, and time. He has normal reflexes. No cranial nerve deficit. He exhibits normal muscle tone. Coordination normal.  Skin: Skin is warm and dry. No rash noted. He is not diaphoretic.  Diffuse sun related changes throughout.  Psychiatric: He has a normal mood and affect. His behavior is normal. Judgment and thought content normal.        Assessment & Plan:   1. Routine physical examination   2. Pure hypercholesterolemia   3. Glucose intolerance (impaired glucose tolerance)   4. Screening for prostate cancer   5. Trigeminal neuralgia of left side of face   6. Blood pressure elevated   7. History of basal cell carcinoma      1. Complete Physical Examination: anticipatory guidance --- exercise, weight loss.  Colonoscopy UTD.  Immunizations UTD.   2.  Hypercholesterolemia: stable; obtain labs; recommend exercise, weight loss, low-cholesterol food choices. 3.  Glucose Intolerance: stable; obtain labs; recommend dietary modification and weight loss. 4.  Screening  prostate cancer: DRE completed; obtain PSA. 5.  Trigeminal neuralgia L: controlled; obtain labs; refill of medication. 6.  Blood pressure elevated: remains borderline; continue to monitor. 7.  History of basal cell carcinoma: stable; followed by dermatology annually.    Meds ordered this encounter  Medications  . CINNAMON PO    Sig: Take by mouth daily.  Marland Kitchen MILK THISTLE PO    Sig: Take by mouth daily.   . Multiple Vitamin (MULTIVITAMIN) tablet    Sig: Take 1 tablet by mouth daily.  . Omega-3 Fatty Acids (FISH OIL PO)    Sig: Take by mouth daily.  Marland Kitchen DISCONTD: carbamazepine (TEGRETOL XR) 100 MG 12 hr tablet    Sig: Take 1 tablet (100 mg total) by mouth every other day.    Dispense:  30 tablet    Refill:  11  . carbamazepine (TEGRETOL XR) 100 MG 12 hr tablet    Sig: Take 1 tablet (100 mg total) by mouth every other day.    Dispense:  90 tablet    Refill:  3    Return in about 1 year (around 11/13/2015) for complete physical examiniation.     Brett Johnson, M.D. [facility] [address] [place], [place]  [address] [phone] phone [phone] fax

## 2014-11-14 ENCOUNTER — Encounter: Payer: Self-pay | Admitting: Family Medicine

## 2014-11-14 LAB — PSA: PSA: 0.66 ng/mL (ref <=4.00)

## 2014-11-25 ENCOUNTER — Telehealth: Payer: Self-pay

## 2014-11-25 NOTE — Telephone Encounter (Signed)
Pt called and LM about labs. Called back and LMOM that overall everything looked normal and that we sent a copy, but to CB and let us know if he is willing to start a chol med

## 2015-01-08 ENCOUNTER — Encounter: Payer: Federal, State, Local not specified - PPO | Admitting: Family Medicine

## 2015-03-25 ENCOUNTER — Encounter: Payer: Self-pay | Admitting: Family Medicine

## 2015-03-28 ENCOUNTER — Other Ambulatory Visit: Payer: Self-pay | Admitting: Family Medicine

## 2015-03-28 ENCOUNTER — Encounter: Payer: Self-pay | Admitting: Family Medicine

## 2015-03-28 DIAGNOSIS — Z13220 Encounter for screening for lipoid disorders: Secondary | ICD-10-CM

## 2015-03-28 DIAGNOSIS — R030 Elevated blood-pressure reading, without diagnosis of hypertension: Secondary | ICD-10-CM

## 2015-03-28 DIAGNOSIS — IMO0001 Reserved for inherently not codable concepts without codable children: Secondary | ICD-10-CM

## 2015-03-28 DIAGNOSIS — R7302 Impaired glucose tolerance (oral): Secondary | ICD-10-CM

## 2015-03-28 DIAGNOSIS — Z125 Encounter for screening for malignant neoplasm of prostate: Secondary | ICD-10-CM

## 2015-03-28 DIAGNOSIS — G500 Trigeminal neuralgia: Secondary | ICD-10-CM

## 2015-03-28 MED ORDER — DICLOFENAC SODIUM 75 MG PO TBEC: 75.0000 mg | DELAYED_RELEASE_TABLET | Freq: Two times a day (BID) | ORAL | Status: DC | PRN

## 2015-05-12 ENCOUNTER — Ambulatory Visit (INDEPENDENT_AMBULATORY_CARE_PROVIDER_SITE_OTHER): Payer: Federal, State, Local not specified - PPO

## 2015-05-12 DIAGNOSIS — Z23 Encounter for immunization: Secondary | ICD-10-CM | POA: Diagnosis not present

## 2015-08-03 DIAGNOSIS — C4492 Squamous cell carcinoma of skin, unspecified: Secondary | ICD-10-CM

## 2015-08-03 HISTORY — DX: Squamous cell carcinoma of skin, unspecified: C44.92

## 2015-09-04 ENCOUNTER — Telehealth: Payer: Self-pay | Admitting: Family Medicine

## 2015-09-04 NOTE — Telephone Encounter (Signed)
lmom of pt appt 03/02/2016

## 2015-09-15 ENCOUNTER — Encounter: Payer: Self-pay | Admitting: Family Medicine

## 2015-09-22 ENCOUNTER — Encounter: Payer: Self-pay | Admitting: Family Medicine

## 2015-11-07 ENCOUNTER — Encounter: Payer: Federal, State, Local not specified - PPO | Admitting: Family Medicine

## 2015-11-12 ENCOUNTER — Encounter: Payer: Federal, State, Local not specified - PPO | Admitting: Family Medicine

## 2015-11-14 ENCOUNTER — Encounter: Payer: Federal, State, Local not specified - PPO | Admitting: Family Medicine

## 2015-12-01 ENCOUNTER — Other Ambulatory Visit: Payer: Self-pay | Admitting: Family Medicine

## 2015-12-02 ENCOUNTER — Encounter: Payer: Federal, State, Local not specified - PPO | Admitting: Family Medicine

## 2015-12-04 ENCOUNTER — Encounter: Payer: Self-pay | Admitting: Family Medicine

## 2015-12-09 MED ORDER — DICLOFENAC SODIUM 75 MG PO TBEC: 75.0000 mg | DELAYED_RELEASE_TABLET | Freq: Two times a day (BID) | ORAL | Status: DC | PRN

## 2016-01-01 ENCOUNTER — Other Ambulatory Visit: Payer: Self-pay | Admitting: Family Medicine

## 2016-01-28 ENCOUNTER — Other Ambulatory Visit: Payer: Self-pay | Admitting: Family Medicine

## 2016-02-11 ENCOUNTER — Other Ambulatory Visit: Payer: Self-pay | Admitting: Family Medicine

## 2016-03-02 ENCOUNTER — Ambulatory Visit: Payer: Federal, State, Local not specified - PPO | Admitting: Family Medicine

## 2016-03-04 ENCOUNTER — Ambulatory Visit: Payer: Federal, State, Local not specified - PPO | Admitting: Family Medicine

## 2016-03-23 DIAGNOSIS — E559 Vitamin D deficiency, unspecified: Secondary | ICD-10-CM | POA: Insufficient documentation

## 2016-03-23 DIAGNOSIS — E7800 Pure hypercholesterolemia, unspecified: Secondary | ICD-10-CM | POA: Insufficient documentation

## 2016-03-23 NOTE — Progress Notes (Signed)
Chief Complaint  Patient presents with  . Establish Care    new patient to establish. Needs some refills today.    Patient presents to establish care. He had to reschedule his last visit, as his father had passed away that morning.  He was seeing Dr. Ubaldo Glassing due to hives. She did labs in January 2017 (nonfasting) which he brings today--normal CBC, nonfasting gluc 110, rest of chem panel normal, TSH normal, ANA negative  Review of chart shows his last physical was 11/2014.  At that time, labs showed prediabetes and elevated cholesterol.  He was advised that his Framingham predicted risk of a cardiovascular event in the upcoming ten years was 19.17%, and statin was recommended (due to a predicted risk greater than 10%.)  He also has h/o elevated BP's.  He monitors BP at home only prior to appointments--he hasn't checked BP in a while.  Trigeminal neuralgia since childhood.  Sometimes on the right, other times on the left.  Saw neuro in the past and put on tegretol.  Weaned to every other day, which still controls it.  If he develops symptoms, he increases the dose back up to daily and also uses short-acting.  He needs fill of the tegretol extended release.  Gout--usually in the toes, or ankle.  Infrequent flares, treated prn with diclofenac with good results.  Review of labs: Lab Results  Component Value Date   PSA 0.66 11/13/2014   PSA 0.69 01/02/2014   PSA 0.76 11/13/2012   Lab Results  Component Value Date   CHOL 210 (H) 11/13/2014   HDL 47 11/13/2014   LDLCALC 129 (H) 11/13/2014   LDLDIRECT 126.5 02/18/2012   TRIG 171 (H) 11/13/2014   CHOLHDL 4.5 11/13/2014     Chemistry      Component Value Date/Time   NA 138 11/13/2014 0935   K 4.5 11/13/2014 0935   CL 104 11/13/2014 0935   CO2 24 11/13/2014 0935   BUN 20 11/13/2014 0935   CREATININE 0.77 11/13/2014 0935      Component Value Date/Time   CALCIUM 9.3 11/13/2014 0935   ALKPHOS 37 (L) 11/13/2014 0935   AST 19 11/13/2014  0935   ALT 28 11/13/2014 0935   BILITOT 0.6 11/13/2014 0935     Lab Results  Component Value Date   HGBA1C 6.4 (H) 11/13/2014   Lab Results  Component Value Date   TSH 1.336 11/13/2014   Lab Results  Component Value Date   WBC 4.6 11/13/2014   HGB 15.7 11/13/2014   HCT 44.6 11/13/2014   MCV 92.9 11/13/2014   PLT 153 11/13/2014    Vitamin D level was checked in 2014 and was low at 25 I see no record of Hepatitis C screen or uric acid  Immunization History  Administered Date(s) Administered  . Influenza Split 05/11/2012  . Influenza,inj,Quad PF,36+ Mos 04/23/2013, 05/06/2014, 05/12/2015  . Tdap 05/02/2006  . Zoster 03/05/2013   Past Medical History:  Diagnosis Date  . Cancer ([place])    Basal cell cancer; followed annually by dermatology/[facility].  . Glucose intolerance (impaired glucose tolerance)   . Gout 08/02/2012  . Herpes labialis   . Squamous cell carcinoma of skin 2017   right arm, removed by Dr. Martinique  . Trigeminal neuralgia of left side of face    since childhood. controlled by tegretol (has had it on both sides in the past)    Past Surgical History:  Procedure Laterality Date  . COLONOSCOPY  11/01/2010   normal;  repeat in 10 years.  [facility].    Social History   Social History  . Marital status: Married    Spouse name: N/A  . Number of children: N/A  . Years of education: N/A   Occupational History  . Not on file.   Social History Main Topics  . Smoking status: Never Smoker  . Smokeless tobacco: Never Used  . Alcohol use 7.0 oz/week    14 Standard drinks or equivalent per week  . Drug use: No  . Sexual activity: Yes    Partners: Female   Other Topics Concern  . Not on file   Social History Narrative   Marital status:  Married x 39 years.      Children:  1 daughter; 1 grandchildren.      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  40 -50 hours per week. Appointment will be ending  Spring 2018      Tobacco: none      Alcohol:  2-3 glasses of wine daily      Drugs: none      Exercising: walking 30-60 minutes daily. No regular weight-bearing exercise      Seatbelt: 100%      Guns:  Loaded partially secured; no children in house.      Sunscreen:  SPF 30-50.      Dermatologist:  [facility]   Gastroenterologist:  [facility].   OrthoTheda Sers   OphthalmThom Chimes    Family History  Problem Relation Age of Onset  . Colon polyps Father   . Diabetes Father   . Arthritis Father   . Heart disease Father     valve replacement.  . Pulmonary embolism Mother   . Heart disease Maternal Grandfather   . Diabetes Brother   . Hypertension Brother     Outpatient Encounter Prescriptions as of 03/24/2016  Medication Sig Note  . aspirin 81 MG tablet Take 81 mg by mouth daily.   . carbamazepine (TEGRETOL XR) 100 MG 12 hr tablet TAKE 1 TABLET BY MOUTH EVERY OTHER DAY   . CINNAMON PO Take by mouth daily.   . diclofenac (VOLTAREN) 75 MG EC tablet TAKE 1 TABLET (75 MG TOTAL) BY MOUTH 2 (TWO) TIMES DAILY AS NEEDED. 03/24/2016: Uses prn gout  . diphenhydrAMINE (BENADRYL) 25 MG tablet Take 25 mg by mouth at bedtime as needed for sleep.    . fish oil-omega-3 fatty acids 1000 MG capsule Take 1,200 g by mouth 2 (two) times daily.     . Melatonin 3 MG TABS Take 3 mg by mouth as needed.   Marland Kitchen MILK THISTLE PO Take by mouth daily.    . Multiple Vitamin (MULTIVITAMIN) tablet Take 1 tablet by mouth daily.   . naproxen sodium (ANAPROX) 220 MG tablet Take 220 mg by mouth 2 (two) times daily with a meal. 03/24/2016: Uses Aleve prn pain, headache  . [DISCONTINUED] Omega-3 Fatty Acids (FISH OIL PO) Take by mouth daily.   . fexofenadine (ALLEGRA) 180 MG tablet Take 180 mg by mouth daily. 03/24/2016: Used for hives, resolved  . valACYclovir (VALTREX) 1000 MG tablet Take 2 tablets (2,000 mg total) by mouth 2 (two) times daily. (Patient not taking: Reported on 03/24/2016) 11/13/2014: PRN   No  facility-administered encounter medications on file as of 03/24/2016.     Allergies  Allergen Reactions  . Minocycline Anaphylaxis  . Penicillins Hives   ROS: Gained weight since going back to work  in 2010, gained 20-25#. No headaches, dizziness, numbness, tingling, chest pain, palpitations, URI symptoms ,cough, shortness of breath, GI or GU complaints, bleeding, bruising, rash, joint pains or other problems. See HPI    PHYSICAL EXAM:  BP 136/86 (BP Location: Right Arm, Patient Position: Supine, Cuff Size: Normal)   Pulse 72   Ht 5' 8.5" (1.74 m)   Wt 203 lb 6.4 oz (92.3 kg)   BMI 30.48 kg/m   Well developed, pleasant male in no distress HEENT: PERRL, EOMI, conjunctiva and sclera are clear, OP clear Neck: no lymphadenopathy, thyromegaly, bruit Heart: regular rate and rhythm without murmur Lungs: clear bilaterally Back: no CVA tenderness Abdomen: soft, nontender, no organomegaly or mass Extremities: no edema, normal pulse  (foot exam not performed today)   Lab Results  Component Value Date   HGBA1C 6.8 03/24/2016    ASSESSMENT/PLAN:  New onset type 2 diabetes mellitus ([place]) - counseled extensively re: risks, goals. trial diet/exercise and hold off on metformin for now (discussed potential to start in future) - Plan: Microalbumin / creatinine urine ratio, Comprehensive metabolic panel  Blood pressure elevated - periodically check at home, regular exercise, low sodium diet. Discussed ACEI if +microalbumin (to help with both)  Glucose intolerance (impaired glucose tolerance) - now meets criteria for DM - Plan: HgB A1c  Pure hypercholesterolemia - Needs statin started--will start after he comes back for baseline labs - Plan: Lipid panel  Vitamin D deficiency - recheck and treat if needed - Plan: VITAMIN D 25 Hydroxy (Vit-D Deficiency, Fractures)  Gout of foot, unspecified cause, unspecified chronicity, unspecified laterality - infrequent flares. check uric acid. doubt will  need daily suppresion, improved with dietary changes - Plan: Uric acid  Trigeminal neuralgia - Plan: carbamazepine (TEGRETOL XR) 100 MG 12 hr tablet  Screening for prostate cancer - Plan: PSA  Need for hepatitis C screening test - Plan: Hepatitis C antibody   c-met, lipid, Hep C Ab, Vit D, PSA, urine microalbumin, uric acid CBC and TSH done by derm in January  Td and prevnar-13 recommended today--left without receiving, will give when he comes for labs, along with flu shot (which he declined today, preferred to wait until September/October)  Return for fasting labs--he can't return until 9/6-7.  CPE scheduled for February Prefers fasting labs prior--will need to enter orders after seeing current lab results. Needs  Diabetic foot exam done   40 min visit more than 1/2 spent counseling. Discussed diabetes (natural course, meds ,diet ,risks), ACEI, statins in detail

## 2016-03-24 ENCOUNTER — Ambulatory Visit (INDEPENDENT_AMBULATORY_CARE_PROVIDER_SITE_OTHER): Payer: Federal, State, Local not specified - PPO | Admitting: Family Medicine

## 2016-03-24 ENCOUNTER — Encounter: Payer: Self-pay | Admitting: Family Medicine

## 2016-03-24 VITALS — BP 136/86 | HR 72 | Ht 68.5 in | Wt 203.4 lb

## 2016-03-24 DIAGNOSIS — Z125 Encounter for screening for malignant neoplasm of prostate: Secondary | ICD-10-CM | POA: Diagnosis not present

## 2016-03-24 DIAGNOSIS — E559 Vitamin D deficiency, unspecified: Secondary | ICD-10-CM | POA: Diagnosis not present

## 2016-03-24 DIAGNOSIS — Z1159 Encounter for screening for other viral diseases: Secondary | ICD-10-CM

## 2016-03-24 DIAGNOSIS — E7800 Pure hypercholesterolemia, unspecified: Secondary | ICD-10-CM | POA: Diagnosis not present

## 2016-03-24 DIAGNOSIS — R7302 Impaired glucose tolerance (oral): Secondary | ICD-10-CM

## 2016-03-24 DIAGNOSIS — G500 Trigeminal neuralgia: Secondary | ICD-10-CM | POA: Diagnosis not present

## 2016-03-24 DIAGNOSIS — IMO0001 Reserved for inherently not codable concepts without codable children: Secondary | ICD-10-CM

## 2016-03-24 DIAGNOSIS — M109 Gout, unspecified: Secondary | ICD-10-CM

## 2016-03-24 DIAGNOSIS — R030 Elevated blood-pressure reading, without diagnosis of hypertension: Secondary | ICD-10-CM

## 2016-03-24 DIAGNOSIS — E119 Type 2 diabetes mellitus without complications: Secondary | ICD-10-CM

## 2016-03-24 DIAGNOSIS — M10079 Idiopathic gout, unspecified ankle and foot: Secondary | ICD-10-CM

## 2016-03-24 LAB — POCT GLYCOSYLATED HEMOGLOBIN (HGB A1C): HEMOGLOBIN A1C: 6.8

## 2016-03-24 MED ORDER — CARBAMAZEPINE ER 100 MG PO TB12: 100.0000 mg | ORAL_TABLET | ORAL | 5 refills | Status: DC

## 2016-03-24 NOTE — Patient Instructions (Signed)
Your Hemoglobin A1c was 6.8%, now meeting criteria for the diagnosis of diabetes. Limiting the sugar, carbs, sweets and losing some of the weight you gained will likely help keep this under control.  We may or may not need to add a medication such as metformin, but let's give diet/exercise/weight loss a chance  I think you ARE going to need a statin medication regardless of what the cholesterol shows on your next bloodwork.  I'd like to get the baseline labs prior to starting the statin.  I will likely start with atorvastatin (generic for Lipitor).  If you develop any muscle aches or side effects, try adding Coenzyme Q10, taken daily along with the statin. If you continue to have side effects, please contact us, and we will try a different statin.  Periodically monitor your blood pressure. We will be checking a urine test with your labs--if that shows any abnormal protein, we may need to start a blood pressure medication even if your blood pressure is okay (to help protect the kidney from diabetes).

## 2016-03-26 ENCOUNTER — Encounter: Payer: Self-pay | Admitting: Family Medicine

## 2016-04-01 ENCOUNTER — Encounter: Payer: Self-pay | Admitting: Family Medicine

## 2016-04-07 ENCOUNTER — Encounter: Payer: Self-pay | Admitting: Family Medicine

## 2016-04-07 DIAGNOSIS — G500 Trigeminal neuralgia: Secondary | ICD-10-CM

## 2016-04-07 DIAGNOSIS — M109 Gout, unspecified: Secondary | ICD-10-CM

## 2016-04-07 MED ORDER — DICLOFENAC SODIUM 75 MG PO TBEC: DELAYED_RELEASE_TABLET | ORAL | 1 refills | Status: DC

## 2016-04-07 MED ORDER — CARBAMAZEPINE ER 100 MG PO TB12: 100.0000 mg | ORAL_TABLET | Freq: Every day | ORAL | 5 refills | Status: DC

## 2016-04-07 MED ORDER — COLCHICINE 0.6 MG PO TABS: ORAL_TABLET | ORAL | 0 refills | Status: DC

## 2016-04-09 ENCOUNTER — Other Ambulatory Visit: Payer: Federal, State, Local not specified - PPO

## 2016-04-15 ENCOUNTER — Other Ambulatory Visit: Payer: Federal, State, Local not specified - PPO

## 2016-04-15 DIAGNOSIS — Z125 Encounter for screening for malignant neoplasm of prostate: Secondary | ICD-10-CM

## 2016-04-15 DIAGNOSIS — Z1159 Encounter for screening for other viral diseases: Secondary | ICD-10-CM

## 2016-04-15 DIAGNOSIS — E119 Type 2 diabetes mellitus without complications: Secondary | ICD-10-CM

## 2016-04-15 DIAGNOSIS — E559 Vitamin D deficiency, unspecified: Secondary | ICD-10-CM

## 2016-04-15 DIAGNOSIS — E7800 Pure hypercholesterolemia, unspecified: Secondary | ICD-10-CM

## 2016-04-15 DIAGNOSIS — M109 Gout, unspecified: Secondary | ICD-10-CM

## 2016-04-15 LAB — URIC ACID: URIC ACID, SERUM: 8.6 mg/dL — AB (ref 4.0–8.0)

## 2016-04-15 LAB — COMPREHENSIVE METABOLIC PANEL
ALT: 35 U/L (ref 9–46)
AST: 23 U/L (ref 10–35)
Albumin: 4.3 g/dL (ref 3.6–5.1)
Alkaline Phosphatase: 43 U/L (ref 40–115)
BUN: 21 mg/dL (ref 7–25)
CHLORIDE: 101 mmol/L (ref 98–110)
CO2: 26 mmol/L (ref 20–31)
CREATININE: 0.9 mg/dL (ref 0.70–1.25)
Calcium: 9.2 mg/dL (ref 8.6–10.3)
Glucose, Bld: 144 mg/dL — ABNORMAL HIGH (ref 65–99)
POTASSIUM: 4.9 mmol/L (ref 3.5–5.3)
SODIUM: 137 mmol/L (ref 135–146)
TOTAL PROTEIN: 6.6 g/dL (ref 6.1–8.1)
Total Bilirubin: 0.5 mg/dL (ref 0.2–1.2)

## 2016-04-15 LAB — LIPID PANEL
CHOL/HDL RATIO: 6.4 ratio — AB (ref <=5.0)
Cholesterol: 230 mg/dL — ABNORMAL HIGH (ref 125–200)
HDL: 36 mg/dL — ABNORMAL LOW (ref >=40)
Triglycerides: 631 mg/dL — ABNORMAL HIGH (ref <150)

## 2016-04-15 LAB — PSA: PSA: 0.6 ng/mL (ref <=4.0)

## 2016-04-16 LAB — HEPATITIS C ANTIBODY: HCV Ab: NEGATIVE

## 2016-04-16 LAB — MICROALBUMIN / CREATININE URINE RATIO
CREATININE, URINE: 120 mg/dL (ref 20–370)
MICROALB UR: 3.8 mg/dL
MICROALB/CREAT RATIO: 32 ug/mg{creat} — AB (ref <30)

## 2016-04-16 LAB — VITAMIN D 25 HYDROXY (VIT D DEFICIENCY, FRACTURES): VIT D 25 HYDROXY: 19 ng/mL — AB (ref 30–100)

## 2016-04-27 DIAGNOSIS — E781 Pure hyperglyceridemia: Secondary | ICD-10-CM | POA: Insufficient documentation

## 2016-04-27 DIAGNOSIS — E790 Hyperuricemia without signs of inflammatory arthritis and tophaceous disease: Secondary | ICD-10-CM | POA: Insufficient documentation

## 2016-04-27 DIAGNOSIS — M109 Gout, unspecified: Secondary | ICD-10-CM | POA: Insufficient documentation

## 2016-04-27 NOTE — Progress Notes (Signed)
Chief Complaint  Patient presents with  . Follow-up    lab results and imm update. Has question to what is daily recommendation for him for vitamin D.    Patient was asked to return to discuss his abnormal labs.  At his last visit he was diagnosed as diabetic (A1c 6.8). He subsequently returned for fasting labs.TG were very high, +mild microalbuminuria, elevated uric acid level (has h/o gout in hands), low vitamin D. Also needs Td, Prevnar and flu shot, as well as diabetic foot exam.  BP at home prior to visit was 130/85.  Flare of trigeminal neuralgia after last visit--took an extra dose of carbamazepine and pain subsided.  Back to taking tegretol every other day.  He had a gout flare in his finger--not really sure it was gout, as there was also a large blister. Completely resolved.  He has been traveling a lot--diet isn't as good when at meetings, restaurants where food choices are limited. Does better when home. Denies numbness, tingling, skin lesions/sores  PMH, PSH, SH reviewed  Current Outpatient Prescriptions on File Prior to Visit  Medication Sig Dispense Refill  . aspirin 81 MG tablet Take 81 mg by mouth daily.    . carbamazepine (TEGRETOL XR) 100 MG 12 hr tablet Take 1 tablet (100 mg total) by mouth daily. 30 tablet 5  . CINNAMON PO Take by mouth daily.    . fish oil-omega-3 fatty acids 1000 MG capsule Take 1,200 g by mouth 2 (two) times daily.      Marland Kitchen MILK THISTLE PO Take by mouth daily.     . Multiple Vitamin (MULTIVITAMIN) tablet Take 1 tablet by mouth daily.    . colchicine 0.6 MG tablet Take 2 tablets by mouth at onset of gout flare.  May repeat 1 tablet in 1 hour if needed (max 3 tabs/24 hr) (Patient not taking: Reported on 04/28/2016) 20 tablet 0  . diclofenac (VOLTAREN) 75 MG EC tablet TAKE 1 TABLET (75 MG TOTAL) BY MOUTH 2 (TWO) TIMES DAILY AS NEEDED. (Patient not taking: Reported on 04/28/2016) 60 tablet 1  . diphenhydrAMINE (BENADRYL) 25 MG tablet Take 25 mg by mouth  at bedtime as needed for sleep.     . fexofenadine (ALLEGRA) 180 MG tablet Take 180 mg by mouth daily.    . Melatonin 3 MG TABS Take 3 mg by mouth as needed.    . naproxen sodium (ANAPROX) 220 MG tablet Take 220 mg by mouth 2 (two) times daily with a meal.    . valACYclovir (VALTREX) 1000 MG tablet Take 2 tablets (2,000 mg total) by mouth 2 (two) times daily. (Patient not taking: Reported on 04/28/2016) 120 tablet 6   No current facility-administered medications on file prior to visit.    Allergies  Allergen Reactions  . Minocycline Anaphylaxis  . Penicillins Hives   ROS: no fever, chills, URI symptoms, headaches, dizziness, chest pain, shortness of breath, GI complaints. No current joint complaints. No urinary complaints. Moods are good. See HPI. Started getting a sore on the inside of his R nostril, slightly tender.  Also has a canker sore/sore on the side of his tongue--seems to be resolving, less painful.  PHYSICAL EXAM:  BP (!) 142/90 (BP Location: Left Arm, Patient Position: Sitting, Cuff Size: Normal)   Pulse 72   Ht 5' 8.5" (1.74 m)   Wt 206 lb (93.4 kg)   BMI 30.87 kg/m   146/70 on repeat by MD Well developed, pleasant male with some abdominal obesity HEENT:  Small aphthous ulcer on right side of tongue, posteriorly.  No surrounding soft tissue swelling. Tip of right nares slightly red, tender distally on external portion. No fluctuance, pustule Neck: no lymphadenopathy or mass Heart:  regular rate and rhythm Lungs: clear bilaterally Abdomen: soft, nontender, no mass Extremities: no edema Psych: normal mood, affect, hygiene and grooming Neuro: alert and oriented, cranial nerves intact; normal strength, gait.      Chemistry      Component Value Date/Time   NA 137 04/15/2016 0719   K 4.9 04/15/2016 0719   CL 101 04/15/2016 0719   CO2 26 04/15/2016 0719   BUN 21 04/15/2016 0719   CREATININE 0.90 04/15/2016 0719      Component Value Date/Time   CALCIUM 9.2  04/15/2016 0719   ALKPHOS 43 04/15/2016 0719   AST 23 04/15/2016 0719   ALT 35 04/15/2016 0719   BILITOT 0.5 04/15/2016 0719     Lab Results  Component Value Date   HGBA1C 6.8 03/24/2016   Lab Results  Component Value Date   CHOL 230 (H) 04/15/2016   HDL 36 (L) 04/15/2016   LDLCALC NOT CALC 04/15/2016   LDLDIRECT 126.5 02/18/2012   TRIG 631 (H) 04/15/2016   CHOLHDL 6.4 (H) 04/15/2016   Lab Results  Component Value Date   PSA 0.6 04/15/2016   PSA 0.66 11/13/2014   PSA 0.69 01/02/2014   Lab Results  Component Value Date   LABURIC 8.6 (H) 04/15/2016   Vitamin D-OH level was 19 Urine microalbumin/Cr ratio was 32  Hepatitis C Ab negative  ASSESSMENT/PLAN  Vitamin D deficiency - rx 50K x 12 weeks, followed by 1000-2000 IU daily. Recheck at CPE in Feb - Plan: Vitamin D, Ergocalciferol, (DRISDOL) 50000 units CAPS capsule  Acute gout of hand, unspecified cause, unspecified laterality - uric acid level high, with recent flare (not current); start allopurinol; continue low purine diet; NSAID/colchicine prn - Plan: allopurinol (ZYLOPRIM) 300 MG tablet  Elevated uric acid in blood - Plan: allopurinol (ZYLOPRIM) 300 MG tablet  Immunization due  Type 2 diabetes mellitus with microalbuminuria, without long-term current use of insulin (HCC) - starting many other new meds today--hold off on Metformin for now, continue dietary trial/weight loss; start ARB for microalbuminuria and BP - Plan: losartan (COZAAR) 25 MG tablet  Need for prophylactic vaccination and inoculation against influenza - Plan: Flu Vaccine QUAD 36+ mos PF IM (Fluarix & Fluzone Quad PF)  Need for TD vaccine - Plan: Td : Tetanus/diphtheria >7yo Preservative  free  Need for pneumococcal vaccination - Plan: Pneumococcal conjugate vaccine 13-valent  Mixed hyperlipidemia due to type 2 diabetes mellitus ([place]) - TG very high, only slightly high in past. LDL 120's in past. Start statin; risks/side effects discussed.  Lowfat, low cholesterol diet reviewed - Plan: atorvastatin (LIPITOR) 40 MG tablet   Td Prevnar Flu   Start ACE/ARB--Losartan 51m (cut in half if BP's very low, doubt will occur) Allopurinol 3074md--start now (not during flare); goal uric acid <6 Start statin--may need further meds if TG remains elevated. Diet reviewed. Atorvastatin 4066mnd 3000-4000m24msh oil daily  2 months--uric acid level, c-met, A1c, lipid (Lab visit)  CPE in Feb--Vit D, microalb recheck; and others, depending on November's labs       Start Allopurinol 300mg68make this once daily to lower uric acid levels and prevent gout flares.  (don't start during a flare--start it now).  Take the atorvastatin for your cholesterol every night (change to morning if you can't  remember to take medications in the evening). If you develop any muscle aches or pains, add Coenzyme Q10 daily (this is an OTC supplement that significantly improves this side effect).  If your side effects persist, contact us and we can change to a different statin medication. Be sure to avoid fried/greasy foods, as well as sweets and sugar, all of which can increase the triglycerides.  3000-4051m of omega-3 fish oil will also help.  Take the Vitamin D prescription once weekly for 12 weeks. Once you finish the prescription, you need to take a daily supplement of 1000-2000 IU every day, long-term, to maintain these levels (if taking a multivitamin with D, then probably 1000 IU is enough; if not daily vitamin, then take 2000).  Losartan 276m-this is a medication that will help protect the kidneys from diabetes (and improve the leaky tubules causing protein in the urine).  It also lowers blood pressure. If it lowers your blood pressure too much--BP is <100-105/60 or if you are feeling dizzy with low blood pressures, then cut the tablets in 1/2.    I recommend starting these medications at least a week apart, so that if you have side effects, you will know  which one may be causing it.

## 2016-04-28 ENCOUNTER — Ambulatory Visit (INDEPENDENT_AMBULATORY_CARE_PROVIDER_SITE_OTHER): Payer: Federal, State, Local not specified - PPO | Admitting: Family Medicine

## 2016-04-28 ENCOUNTER — Encounter: Payer: Self-pay | Admitting: Family Medicine

## 2016-04-28 VITALS — BP 142/90 | HR 72 | Ht 68.5 in | Wt 206.0 lb

## 2016-04-28 DIAGNOSIS — M10049 Idiopathic gout, unspecified hand: Secondary | ICD-10-CM | POA: Diagnosis not present

## 2016-04-28 DIAGNOSIS — E559 Vitamin D deficiency, unspecified: Secondary | ICD-10-CM | POA: Diagnosis not present

## 2016-04-28 DIAGNOSIS — E1129 Type 2 diabetes mellitus with other diabetic kidney complication: Secondary | ICD-10-CM

## 2016-04-28 DIAGNOSIS — R7989 Other specified abnormal findings of blood chemistry: Secondary | ICD-10-CM | POA: Diagnosis not present

## 2016-04-28 DIAGNOSIS — Z5181 Encounter for therapeutic drug level monitoring: Secondary | ICD-10-CM

## 2016-04-28 DIAGNOSIS — Z23 Encounter for immunization: Secondary | ICD-10-CM | POA: Diagnosis not present

## 2016-04-28 DIAGNOSIS — E1169 Type 2 diabetes mellitus with other specified complication: Secondary | ICD-10-CM | POA: Diagnosis not present

## 2016-04-28 DIAGNOSIS — E790 Hyperuricemia without signs of inflammatory arthritis and tophaceous disease: Secondary | ICD-10-CM

## 2016-04-28 DIAGNOSIS — M109 Gout, unspecified: Secondary | ICD-10-CM

## 2016-04-28 DIAGNOSIS — R809 Proteinuria, unspecified: Secondary | ICD-10-CM | POA: Diagnosis not present

## 2016-04-28 DIAGNOSIS — E782 Mixed hyperlipidemia: Secondary | ICD-10-CM | POA: Diagnosis not present

## 2016-04-28 MED ORDER — ALLOPURINOL 300 MG PO TABS: 300.0000 mg | ORAL_TABLET | Freq: Every day | ORAL | 1 refills | Status: DC

## 2016-04-28 MED ORDER — LOSARTAN POTASSIUM 25 MG PO TABS
25.0000 mg | ORAL_TABLET | Freq: Every day | ORAL | 1 refills | Status: DC
Start: 2016-04-28 — End: 2016-06-10

## 2016-04-28 MED ORDER — ATORVASTATIN CALCIUM 40 MG PO TABS: 40.0000 mg | ORAL_TABLET | Freq: Every day | ORAL | 1 refills | Status: DC

## 2016-04-28 MED ORDER — VITAMIN D (ERGOCALCIFEROL) 1.25 MG (50000 UNIT) PO CAPS: 50000.0000 [IU] | ORAL_CAPSULE | ORAL | 0 refills | Status: DC

## 2016-04-28 NOTE — Patient Instructions (Signed)
  Start Allopurinol 300mg .  Take this once daily to lower uric acid levels and prevent gout flares.  (don't start during a flare--start it now).  Take the atorvastatin for your cholesterol every night (change to morning if you can't remember to take medications in the evening). If you develop any muscle aches or pains, add Coenzyme Q10 daily (this is an OTC supplement that significantly improves this side effect).  If your side effects persist, contact us and we can change to a different statin medication. Be sure to avoid fried/greasy foods, as well as sweets and sugar, all of which can increase the triglycerides.  3000-4000mg  of omega-3 fish oil will also help.  Take the Vitamin D prescription once weekly for 12 weeks. Once you finish the prescription, you need to take a daily supplement of 1000-2000 IU every day, long-term, to maintain these levels (if taking a multivitamin with D, then probably 1000 IU is enough; if not daily vitamin, then take 2000).  Losartan 25mg --this is a medication that will help protect the kidneys from diabetes (and improve the leaky tubules causing protein in the urine).  It also lowers blood pressure. If it lowers your blood pressure too much--BP is <100-105/60 or if you are feeling dizzy with low blood pressures, then cut the tablets in 1/2.    I recommend starting these medications at least a week apart, so that if you have side effects, you will know which one may be causing it.

## 2016-04-29 ENCOUNTER — Other Ambulatory Visit: Payer: Self-pay

## 2016-06-10 ENCOUNTER — Telehealth: Payer: Self-pay

## 2016-06-10 ENCOUNTER — Other Ambulatory Visit: Payer: Self-pay | Admitting: *Deleted

## 2016-06-10 DIAGNOSIS — R809 Proteinuria, unspecified: Principal | ICD-10-CM

## 2016-06-10 DIAGNOSIS — E1169 Type 2 diabetes mellitus with other specified complication: Secondary | ICD-10-CM

## 2016-06-10 DIAGNOSIS — E790 Hyperuricemia without signs of inflammatory arthritis and tophaceous disease: Secondary | ICD-10-CM

## 2016-06-10 DIAGNOSIS — E1129 Type 2 diabetes mellitus with other diabetic kidney complication: Secondary | ICD-10-CM

## 2016-06-10 DIAGNOSIS — E782 Mixed hyperlipidemia: Secondary | ICD-10-CM

## 2016-06-10 DIAGNOSIS — M109 Gout, unspecified: Secondary | ICD-10-CM

## 2016-06-10 MED ORDER — LOSARTAN POTASSIUM 25 MG PO TABS: 25.0000 mg | ORAL_TABLET | Freq: Every day | ORAL | 0 refills | Status: DC

## 2016-06-10 MED ORDER — ALLOPURINOL 300 MG PO TABS: 300.0000 mg | ORAL_TABLET | Freq: Every day | ORAL | 0 refills | Status: DC

## 2016-06-10 MED ORDER — ATORVASTATIN CALCIUM 40 MG PO TABS: 40.0000 mg | ORAL_TABLET | Freq: Every day | ORAL | 0 refills | Status: DC

## 2016-06-10 NOTE — Telephone Encounter (Signed)
Request for refill of losartan, atorvastatin, and allopurinol. 90 day supply to [facility]

## 2016-06-23 ENCOUNTER — Other Ambulatory Visit: Payer: Federal, State, Local not specified - PPO

## 2016-06-23 DIAGNOSIS — E1129 Type 2 diabetes mellitus with other diabetic kidney complication: Secondary | ICD-10-CM

## 2016-06-23 DIAGNOSIS — E782 Mixed hyperlipidemia: Secondary | ICD-10-CM

## 2016-06-23 DIAGNOSIS — R809 Proteinuria, unspecified: Principal | ICD-10-CM

## 2016-06-23 DIAGNOSIS — Z5181 Encounter for therapeutic drug level monitoring: Secondary | ICD-10-CM

## 2016-06-23 DIAGNOSIS — E1169 Type 2 diabetes mellitus with other specified complication: Secondary | ICD-10-CM

## 2016-06-23 DIAGNOSIS — M109 Gout, unspecified: Secondary | ICD-10-CM

## 2016-06-23 DIAGNOSIS — E790 Hyperuricemia without signs of inflammatory arthritis and tophaceous disease: Secondary | ICD-10-CM

## 2016-06-23 LAB — HEMOGLOBIN A1C
Hgb A1c MFr Bld: 6.2 % — ABNORMAL HIGH (ref <5.7)
MEAN PLASMA GLUCOSE: 131 mg/dL

## 2016-06-24 LAB — URIC ACID: Uric Acid, Serum: 4.6 mg/dL (ref 4.0–8.0)

## 2016-06-24 LAB — COMPREHENSIVE METABOLIC PANEL
ALBUMIN: 4.5 g/dL (ref 3.6–5.1)
ALT: 36 U/L (ref 9–46)
AST: 25 U/L (ref 10–35)
Alkaline Phosphatase: 40 U/L (ref 40–115)
BUN: 17 mg/dL (ref 7–25)
CHLORIDE: 105 mmol/L (ref 98–110)
CO2: 24 mmol/L (ref 20–31)
CREATININE: 0.92 mg/dL (ref 0.70–1.25)
Calcium: 9.1 mg/dL (ref 8.6–10.3)
GLUCOSE: 143 mg/dL — AB (ref 65–99)
Potassium: 4.3 mmol/L (ref 3.5–5.3)
SODIUM: 139 mmol/L (ref 135–146)
Total Bilirubin: 0.6 mg/dL (ref 0.2–1.2)
Total Protein: 7 g/dL (ref 6.1–8.1)

## 2016-06-24 LAB — LIPID PANEL
CHOLESTEROL: 143 mg/dL (ref <200)
HDL: 42 mg/dL (ref >40)
LDL Cholesterol: 48 mg/dL (ref <100)
TRIGLYCERIDES: 265 mg/dL — AB (ref <150)
Total CHOL/HDL Ratio: 3.4 Ratio (ref <5.0)
VLDL: 53 mg/dL — AB (ref <30)

## 2016-07-07 LAB — HM DIABETES EYE EXAM

## 2016-07-08 ENCOUNTER — Other Ambulatory Visit: Payer: Self-pay | Admitting: Family Medicine

## 2016-07-08 DIAGNOSIS — M109 Gout, unspecified: Secondary | ICD-10-CM

## 2016-07-08 NOTE — Telephone Encounter (Signed)
Is this okay to refill? 

## 2016-08-04 ENCOUNTER — Other Ambulatory Visit: Payer: Self-pay | Admitting: Family Medicine

## 2016-08-04 DIAGNOSIS — M109 Gout, unspecified: Secondary | ICD-10-CM

## 2016-08-05 NOTE — Telephone Encounter (Signed)
Called patient-he said he is not actually out but had some money left on a flex spending acct that is about to run out so he thought it would be a good idea to refill and keep on hand if you were okay with it, if you're not-that's okay too.

## 2016-08-05 NOTE — Telephone Encounter (Signed)
I don't believe he should be taking this chronically, just prn. It was last filled #60 last month, so if truly needs refill (and not auto-refill), means he is taking BID regularly.  Check with pt please (?auto-refill?)

## 2016-08-05 NOTE — Telephone Encounter (Signed)
Is this okay to refill? 

## 2016-08-31 ENCOUNTER — Other Ambulatory Visit: Payer: Federal, State, Local not specified - PPO

## 2016-08-31 ENCOUNTER — Telehealth: Payer: Self-pay | Admitting: Internal Medicine

## 2016-08-31 DIAGNOSIS — E1169 Type 2 diabetes mellitus with other specified complication: Secondary | ICD-10-CM

## 2016-08-31 DIAGNOSIS — E559 Vitamin D deficiency, unspecified: Secondary | ICD-10-CM

## 2016-08-31 DIAGNOSIS — E782 Mixed hyperlipidemia: Secondary | ICD-10-CM

## 2016-08-31 DIAGNOSIS — R809 Proteinuria, unspecified: Secondary | ICD-10-CM

## 2016-08-31 DIAGNOSIS — E1129 Type 2 diabetes mellitus with other diabetic kidney complication: Secondary | ICD-10-CM

## 2016-08-31 LAB — LIPID PANEL
Cholesterol: 128 mg/dL (ref <200)
HDL: 42 mg/dL (ref >40)
LDL Cholesterol: 37 mg/dL (ref <100)
TRIGLYCERIDES: 244 mg/dL — AB (ref <150)
Total CHOL/HDL Ratio: 3 Ratio (ref <5.0)
VLDL: 49 mg/dL — AB (ref <30)

## 2016-08-31 LAB — TSH: TSH: 1.25 mIU/L (ref 0.40–4.50)

## 2016-08-31 LAB — GLUCOSE, RANDOM: Glucose, Bld: 140 mg/dL — ABNORMAL HIGH (ref 65–99)

## 2016-08-31 NOTE — Telephone Encounter (Signed)
Released orders

## 2016-08-31 NOTE — Telephone Encounter (Signed)
Orders entered (he has had many labs since the physical was scheduled, had to see what results of lab visits were to determine what was needed for physical).  Urine microalbumin/Cr also due, but we can collect that at his CPE.

## 2016-08-31 NOTE — Telephone Encounter (Signed)
noted 

## 2016-08-31 NOTE — Telephone Encounter (Signed)
Pt came in today for lab visit prior to his CPE next week. Please put in future orders as there was no future orders put in back when scheduled in august 2017 when pt was here

## 2016-09-01 LAB — VITAMIN D 25 HYDROXY (VIT D DEFICIENCY, FRACTURES): VIT D 25 HYDROXY: 29 ng/mL — AB (ref 30–100)

## 2016-09-01 NOTE — Progress Notes (Signed)
Chief Complaint  Patient presents with  . Annual Exam    nonfasting annual exam. His stomach has been bothering him a little bit but was in [place], [place] last week. Has a couple of places on his scalp that he would like you to look at.     Brett Johnson is a 66 y.o. male who presents for a complete physical.  He has the following concerns:  Recently got back from [place]. Some bloating and gas, no diarrhea.  Diabetes: doesn't check blood sugars.  Denies polydipsia, polyuria. UTD on diabetic eye exam.  Checks his feet regularly without concerns.  Had some pain in the right foot for a while, which resolved.  Hyperlipidemia:  Following a lowfat, low cholesterol diet. Eats very healthy at home, but not as great with traveling. Tolerating statin without side effect.   Taking fish oil 2 daily.  Hypertension follow-up:  Blood pressures elsewhere are 118-136/56-85 for the most-part.  A few values in the upper 130's, and one day had 143-151/mid-80's.  Tolerating losartan without side effects.  Denies dizziness, headaches, chest pain.   Gout--taking allopurinol, and denies gout flares. Previous flares have been in toes, ankle.  Trigeminal neuralgia since childhood.  Sometimes on the right, other times on the left.  Saw neuro in the past and put on tegretol.  Weaned to every other day, which still controls it.  If he develops symptoms, he increases the dose back up to daily and also uses short-acting.  Hasn't needed to take this in a while, at least a few months.  Vitamin D deficiency--s/p 12 weeks of prescription therapy.  He is now currently taking an OTC Vitamin D once daily (?dose).  Immunization History  Administered Date(s) Administered  . Influenza Split 05/11/2012  . Influenza,inj,Quad PF,36+ Mos 04/23/2013, 05/06/2014, 05/12/2015, 04/28/2016  . Pneumococcal Conjugate-13 04/28/2016  . Td 04/28/2016  . Tdap 05/02/2006  . Zoster 03/05/2013   Last colonoscopy: 2012 Last  PSA:04/2016 Dentist:every 6 months  Ophtho: yearly Exercise:  Walks daily 30-60 minutes, most days.   Past Medical History:  Diagnosis Date  . Cancer ([place])    Basal cell cancer; followed annually by dermatology/[facility].  . Glucose intolerance (impaired glucose tolerance)   . Gout 08/02/2012  . Herpes labialis   . Squamous cell carcinoma of skin 2017   right arm, removed by Dr. Martinique  . Trigeminal neuralgia of left side of face    since childhood. controlled by tegretol (has had it on both sides in the past)    Past Surgical History:  Procedure Laterality Date  . COLONOSCOPY  11/01/2010   normal; repeat in 10 years.  [facility].    Social History   Social History  . Marital status: Married    Spouse name: N/A  . Number of children: N/A  . Years of education: N/A   Occupational History  . Not on file.   Social History Main Topics  . Smoking status: Never Smoker  . Smokeless tobacco: Never Used  . Alcohol use 7.0 oz/week    14 Standard drinks or equivalent per week     Comment: 2-3 glasses of wine or beer/d  . Drug use: No  . Sexual activity: Yes    Partners: Female   Other Topics Concern  . 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  40 -50 hours per week. Appointment will be ending Spring 2018      Tobacco: none      Alcohol:  2-3 glasses of wine daily      Drugs: none      Exercising: walking 30-60 minutes daily. No regular weight-bearing exercise      Seatbelt: 100%      Guns:  Loaded partially secured; no children in house.      Sunscreen:  SPF 30-50.      Dermatologist:  [facility]   Gastroenterologist:  [facility].   OrthoTheda Sers   OphthalmThom [facility]    Family History  Problem Relation Age of Onset  . Colon polyps Father   . Diabetes Father    . Arthritis Father   . Heart disease Father     valve replacement.  . Hyperlipidemia Father   . Cancer Father     skin  . Pulmonary embolism Mother   . Heart disease Maternal Grandfather   . Diabetes Brother   . Hypertension Brother   . Hyperlipidemia Brother   . Cancer Brother     skin  . Kidney Stones Daughter     Outpatient Encounter Prescriptions as of 09/02/2016  Medication Sig Note  . allopurinol (ZYLOPRIM) 300 MG tablet Take 1 tablet (300 mg total) by mouth daily.   Marland Kitchen aspirin 81 MG tablet Take 81 mg by mouth daily.   Marland Kitchen atorvastatin (LIPITOR) 40 MG tablet Take 1 tablet (40 mg total) by mouth daily.   . carbamazepine (TEGRETOL XR) 100 MG 12 hr tablet Take 1 tablet (100 mg total) by mouth daily. 04/28/2016: Taking every other day  . cholecalciferol (VITAMIN D) 1000 units tablet Take 1,000 Units by mouth daily.   Marland Kitchen CINNAMON PO Take by mouth daily.   . diphenhydrAMINE (BENADRYL) 25 MG tablet Take 25 mg by mouth at bedtime as needed for sleep.    . fish oil-omega-3 fatty acids 1000 MG capsule Take 1,200 g by mouth 2 (two) times daily.     Marland Kitchen losartan (COZAAR) 25 MG tablet Take 1 tablet (25 mg total) by mouth daily.   . Melatonin 3 MG TABS Take 3 mg by mouth as needed.   Marland Kitchen MILK THISTLE PO Take by mouth daily.    . Multiple Vitamin (MULTIVITAMIN) tablet Take 1 tablet by mouth daily.   . colchicine 0.6 MG tablet Take 2 tablets by mouth at onset of gout flare.  May repeat 1 tablet in 1 hour if needed (max 3 tabs/24 hr) (Patient not taking: Reported on 04/28/2016)   . diclofenac (VOLTAREN) 75 MG EC tablet TAKE 1 TABLET BY MOUTH TWICE A DAY AS NEEDED (Patient not taking: Reported on 09/02/2016)   . fexofenadine (ALLEGRA) 180 MG tablet Take 180 mg by mouth daily. 03/24/2016: Used for hives, resolved  . naproxen sodium (ANAPROX) 220 MG tablet Take 220 mg by mouth 2 (two) times daily with a meal. 03/24/2016: Uses Aleve prn pain, headache  . valACYclovir (VALTREX) 1000 MG tablet Take 2 tablets  (2,000 mg total) by mouth 2 (two) times daily. (Patient not taking: Reported on 04/28/2016) 11/13/2014: PRN  . [DISCONTINUED] Vitamin D, Ergocalciferol, (DRISDOL) 50000 units CAPS capsule Take 1 capsule (50,000 Units total) by mouth every 7 (seven) days.    No facility-administered encounter medications on file as of 09/02/2016.     Allergies  Allergen Reactions  . Minocycline Anaphylaxis  .  Penicillins Hives    ROS: The patient denies anorexia, fever, weight changes, headaches,  vision loss, decreased hearing, ear pain, hoarseness, chest pain, palpitations, dizziness, syncope, dyspnea on exertion, cough, swelling, nausea, vomiting, diarrhea, constipation, abdominal pain, melena, hematochezia, indigestion/heartburn, hematuria, incontinence, erectile dysfunction, nocturia, weakened urine stream, dysuria, genital lesions, joint pains, numbness, tingling, weakness, tremor, suspicious skin lesions, depression, anxiety, abnormal bleeding/bruising, or enlarged lymph nodes. Insomnia is managed with OTC meds.  Right lateral foot pain for a couple of weeks; resolved.  No longer having any pain with rest or activity.  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   PHYSICAL EXAM:  BP 130/72 (BP Location: Left Arm, Patient Position: Sitting, Cuff Size: Normal)   Pulse 76   Ht 5' 8"  (1.727 m)   Wt 197 lb 12.8 oz (89.7 kg)   BMI 30.08 kg/m   General Appearance:    Alert, cooperative, no distress, appears stated age  Head:    Normocephalic, without obvious abnormality, atraumatic  Eyes:    PERRL, conjunctiva/corneas clear, EOM's intact, fundi    benign  Ears:    Normal TM's and external ear canals  Nose:   Nares normal, mucosa normal, no drainage or sinus   tenderness  Throat:   Lips, mucosa, and tongue normal; teeth and gums normal  Neck:   Supple, no lymphadenopathy;  thyroid:  no    enlargement/tenderness/nodules; no carotid bruit or JVD  Back:    Spine nontender, no curvature, ROM normal, no CVA     tenderness  Lungs:     Clear to auscultation bilaterally without wheezes, rales or     ronchi; respirations unlabored  Chest Wall:    No tenderness or deformity   Heart:    Regular rate and rhythm, S1 and S2 normal, no murmur, rub   or gallop. Occasional skipped beat noted  Breast Exam:    No chest wall tenderness, masses or gynecomastia  Abdomen:     Soft, non-tender, nondistended, normoactive bowel sounds,    no masses, no hepatosplenomegaly  Genitalia:    Normal male external genitalia without lesions.  Testicles without masses.  No inguinal hernias.  Rectal:    Normal sphincter tone, no masses or tenderness; guaiac negative stool.  Prostate smooth, no nodules, not enlarged.  Extremities:   No clubbing, cyanosis or edema  Pulses:   2+ and symmetric all extremities  Skin:   Skin color, texture, turgor normal, no rashes or lesions  Lymph nodes:   Cervical, supraclavicular, and axillary nodes normal  Neurologic:   CNII-XII intact, normal strength, sensation and gait; reflexes 2+ and symmetric throughout          Psych:   Normal mood, affect, hygiene and grooming.    Labs prior to today's visit: Fasting glucose 140 Lab Results  Component Value Date   CHOL 128 08/31/2016   HDL 42 08/31/2016   LDLCALC 37 08/31/2016   LDLDIRECT 126.5 02/18/2012   TRIG 244 (H) 08/31/2016   CHOLHDL 3.0 08/31/2016   Vitamin D-OH 29  Lab Results  Component Value Date   HGBA1C 6.2 (H) 06/23/2016    ASSESSMENT/PLAN:  Annual physical exam - Plan: POCT Urinalysis Dipstick  Vitamin D deficiency - improved, slightly low.  Verify dose, ensure 2000 IU daily  Mixed hyperlipidemia due to type 2 diabetes mellitus (HCC) - TG remains elevated. Continue fish oil, lowfat diet; encouraged cutting back on  alcohol intake - Plan: atorvastatin (LIPITOR) 40 MG tablet  Type 2 diabetes mellitus with  microalbuminuria, without long-term current use of insulin (HCC) - diet controlled - Plan: losartan (COZAAR) 25 MG tablet  Trigeminal neuralgia  Acute gout of hand, unspecified cause, unspecified laterality - doing well on allopurinol--uric acid level lower, no flares - Plan: allopurinol (ZYLOPRIM) 300 MG tablet  Elevated uric acid in blood - Plan: allopurinol (ZYLOPRIM) 300 MG tablet   Discussed PSA screening (risks/benefits), recommended at least 30 minutes of aerobic activity at least 5 days/week; proper sunscreen use reviewed; healthy diet and alcohol recommendations (less than or equal to 2 drinks/day) reviewed; regular seatbelt use; changing batteries in smoke detectors. Self-testicular exams. Immunization recommendations discussed--Shingrix when available; Pneumovax next year (age 6); continue yearly flu shots.  Colonoscopy recommendations reviewed, UTD  F/u 3-4 months-- A1c, c-met, lipids, Vit D prior  Diabetic eye exam UTD per pt  Check your dose of Vitamin D. If it is 2000 IU, continue.  If it is only 1000 IU, double up, and plan to take 2000 IU daily, longterm (in addition to your multivitamin).   Your triglycerides remain high--Increase your omega-3 fish oil to 3000-4043m daily.  Please try and cut back on the wine/alcohol, as this also contributes. If you can't tolerate the higher doses of the fish oil, let me know if you'd like to retry the prescription forms (Lovaza or Vascepa).

## 2016-09-02 ENCOUNTER — Ambulatory Visit (INDEPENDENT_AMBULATORY_CARE_PROVIDER_SITE_OTHER): Payer: Federal, State, Local not specified - PPO | Admitting: Family Medicine

## 2016-09-02 ENCOUNTER — Encounter: Payer: Self-pay | Admitting: Family Medicine

## 2016-09-02 VITALS — BP 130/72 | HR 76 | Ht 68.0 in | Wt 197.8 lb

## 2016-09-02 DIAGNOSIS — G500 Trigeminal neuralgia: Secondary | ICD-10-CM

## 2016-09-02 DIAGNOSIS — E1169 Type 2 diabetes mellitus with other specified complication: Secondary | ICD-10-CM | POA: Diagnosis not present

## 2016-09-02 DIAGNOSIS — Z0000 Encounter for general adult medical examination without abnormal findings: Secondary | ICD-10-CM

## 2016-09-02 DIAGNOSIS — E790 Hyperuricemia without signs of inflammatory arthritis and tophaceous disease: Secondary | ICD-10-CM | POA: Diagnosis not present

## 2016-09-02 DIAGNOSIS — E559 Vitamin D deficiency, unspecified: Secondary | ICD-10-CM | POA: Diagnosis not present

## 2016-09-02 DIAGNOSIS — M109 Gout, unspecified: Secondary | ICD-10-CM | POA: Diagnosis not present

## 2016-09-02 DIAGNOSIS — E1129 Type 2 diabetes mellitus with other diabetic kidney complication: Secondary | ICD-10-CM

## 2016-09-02 DIAGNOSIS — R809 Proteinuria, unspecified: Secondary | ICD-10-CM

## 2016-09-02 DIAGNOSIS — E782 Mixed hyperlipidemia: Secondary | ICD-10-CM

## 2016-09-02 LAB — POCT URINALYSIS DIPSTICK
BILIRUBIN UA: NEGATIVE
Glucose, UA: NEGATIVE
KETONES UA: NEGATIVE
Leukocytes, UA: NEGATIVE
NITRITE UA: NEGATIVE
PH UA: 6
PROTEIN UA: NEGATIVE
RBC UA: NEGATIVE
Spec Grav, UA: 1.015
Urobilinogen, UA: NEGATIVE

## 2016-09-02 MED ORDER — ALLOPURINOL 300 MG PO TABS: 300.0000 mg | ORAL_TABLET | Freq: Every day | ORAL | 1 refills | Status: DC

## 2016-09-02 MED ORDER — ATORVASTATIN CALCIUM 40 MG PO TABS: 40.0000 mg | ORAL_TABLET | Freq: Every day | ORAL | 1 refills | Status: DC

## 2016-09-02 MED ORDER — LOSARTAN POTASSIUM 25 MG PO TABS: 25.0000 mg | ORAL_TABLET | Freq: Every day | ORAL | 1 refills | Status: DC

## 2016-09-02 NOTE — Patient Instructions (Signed)
  HEALTH MAINTENANCE RECOMMENDATIONS:  It is recommended that you get at least 30 minutes of aerobic exercise at least 5 days/week (for weight loss, you may need as much as 60-90 minutes). This can be any activity that gets your heart rate up. This can be divided in 10-15 minute intervals if needed, but try and build up your endurance at least once a week.  Weight bearing exercise is also recommended twice weekly.  Eat a healthy diet with lots of vegetables, fruits and fiber.  "Colorful" foods have a lot of vitamins (ie green vegetables, tomatoes, red peppers, etc).  Limit sweet tea, regular sodas and alcoholic beverages, all of which has a lot of calories and sugar.  Up to 2 alcoholic drinks daily may be beneficial for men (unless trying to lose weight, watch sugars).  Drink a lot of water.  Sunscreen of at least SPF 30 should be used on all sun-exposed parts of the skin when outside between the hours of 10 am and 4 pm (not just when at beach or pool, but even with exercise, golf, tennis, and yard work!)  Use a sunscreen that says "broad spectrum" so it covers both UVA and UVB rays, and make sure to reapply every 1-2 hours.  Remember to change the batteries in your smoke detectors when changing your clock times in the spring and fall.  Use your seat belt every time you are in a car, and please drive safely and not be distracted with cell phones and texting while driving.   Check your dose of Vitamin D. If it is 2000 IU, continue.  If it is only 1000 IU, double up, and plan to take 2000 IU daily, longterm (in addition to your multivitamin).   Your triglycerides remain high--Increase your omega-3 fish oil to 3000-4000mg  daily.  Please try and cut back on the wine/alcohol, as this also contributes. If you can't tolerate the higher doses of the fish oil, let me know if you'd like to retry the prescription forms (Lovaza or Vascepa).   I recommend getting the new shingles vaccine (Shingrix) when  available. You will need to check with your insurance to see if it is covered, and if covered by Medicare Part D, you need to get from the pharmacy rather than our office.  It is a series of 2 injections, spaced 2 months apart.

## 2016-09-06 ENCOUNTER — Encounter: Payer: Self-pay | Admitting: Family Medicine

## 2016-09-06 DIAGNOSIS — E781 Pure hyperglyceridemia: Secondary | ICD-10-CM

## 2016-09-06 MED ORDER — ICOSAPENT ETHYL 1 G PO CAPS: 2.0000 | ORAL_CAPSULE | Freq: Two times a day (BID) | ORAL | 5 refills | Status: DC

## 2016-10-09 ENCOUNTER — Other Ambulatory Visit: Payer: Self-pay | Admitting: Family Medicine

## 2016-10-09 DIAGNOSIS — G500 Trigeminal neuralgia: Secondary | ICD-10-CM

## 2016-10-11 NOTE — Telephone Encounter (Signed)
Is this okay to refill? 

## 2016-11-10 ENCOUNTER — Encounter: Payer: Federal, State, Local not specified - PPO | Admitting: Family Medicine

## 2016-11-12 ENCOUNTER — Telehealth: Payer: Self-pay

## 2016-11-12 DIAGNOSIS — M109 Gout, unspecified: Secondary | ICD-10-CM

## 2016-11-12 MED ORDER — DICLOFENAC SODIUM 75 MG PO TBEC: 75.0000 mg | DELAYED_RELEASE_TABLET | Freq: Two times a day (BID) | ORAL | 0 refills | Status: DC | PRN

## 2016-11-12 NOTE — Telephone Encounter (Signed)
Fax request rcvd for diclofenac 75mg  to [facility] on [address].

## 2016-11-12 NOTE — Telephone Encounter (Signed)
Last filled #[id number] 08/05/16, had labs in 09/2016.  refilled

## 2016-12-21 ENCOUNTER — Other Ambulatory Visit: Payer: Federal, State, Local not specified - PPO

## 2016-12-22 ENCOUNTER — Encounter: Payer: Federal, State, Local not specified - PPO | Admitting: Family Medicine

## 2017-01-05 ENCOUNTER — Other Ambulatory Visit: Payer: Federal, State, Local not specified - PPO

## 2017-01-05 DIAGNOSIS — E1169 Type 2 diabetes mellitus with other specified complication: Secondary | ICD-10-CM

## 2017-01-05 DIAGNOSIS — E559 Vitamin D deficiency, unspecified: Secondary | ICD-10-CM

## 2017-01-05 DIAGNOSIS — E1129 Type 2 diabetes mellitus with other diabetic kidney complication: Secondary | ICD-10-CM

## 2017-01-05 DIAGNOSIS — R809 Proteinuria, unspecified: Principal | ICD-10-CM

## 2017-01-05 DIAGNOSIS — E782 Mixed hyperlipidemia: Secondary | ICD-10-CM

## 2017-01-05 LAB — COMPREHENSIVE METABOLIC PANEL
ALBUMIN: 4.4 g/dL (ref 3.6–5.1)
ALT: 30 U/L (ref 9–46)
AST: 20 U/L (ref 10–35)
Alkaline Phosphatase: 44 U/L (ref 40–115)
BILIRUBIN TOTAL: 0.6 mg/dL (ref 0.2–1.2)
BUN: 19 mg/dL (ref 7–25)
CO2: 22 mmol/L (ref 20–31)
CREATININE: 0.9 mg/dL (ref 0.70–1.25)
Calcium: 9.2 mg/dL (ref 8.6–10.3)
Chloride: 104 mmol/L (ref 98–110)
Glucose, Bld: 133 mg/dL — ABNORMAL HIGH (ref 65–99)
Potassium: 4.8 mmol/L (ref 3.5–5.3)
SODIUM: 136 mmol/L (ref 135–146)
TOTAL PROTEIN: 6.6 g/dL (ref 6.1–8.1)

## 2017-01-05 LAB — LIPID PANEL
CHOLESTEROL: 143 mg/dL (ref <200)
HDL: 49 mg/dL (ref >40)
LDL Cholesterol: 55 mg/dL (ref <100)
Total CHOL/HDL Ratio: 2.9 Ratio (ref <5.0)
Triglycerides: 194 mg/dL — ABNORMAL HIGH (ref <150)
VLDL: 39 mg/dL — ABNORMAL HIGH (ref <30)

## 2017-01-05 NOTE — Progress Notes (Signed)
Chief Complaint  Patient presents with  . Hyperlipidemia    nonfasting med check, labs already done.    Diabetes: doesn't check blood sugars.  Denies polydipsia, polyuria. UTD on diabetic eye exam, had in the Fall.  Checks his feet regularly without concerns. Now that he is retired, he is hopeful that he will be more active, will be able to lose the weight he had gained. He started swimming again yesterday.  Hyperlipidemia:  Following a lowfat, low cholesterol diet. Eats very healthy at home, but not as great with traveling. Traveling less since retired (just to [place] and to the beach--got a place in [place]). Tolerating statin without side effect. After his last lipids (below), we started him on Vascepa.  It was expensive, so only taking 1 in the morning (no side effects) and taking 2 fish oil in the evening. He is tolerating this without side effects.  Had labs prior to visit (see results under EXAM section). Lab Results  Component Value Date   CHOL 128 08/31/2016   HDL 42 08/31/2016   LDLCALC 37 08/31/2016   LDLDIRECT 126.5 02/18/2012   TRIG 244 (H) 08/31/2016   CHOLHDL 3.0 08/31/2016    Hypertension follow-up:  Blood pressures elsewhere are 120's-130's/70's-80's. Tolerating losartan without side effects.  Denies dizziness, headaches, chest pain.   Gout--taking allopurinol, and denies gout flares. Previous flares have been in toes, ankle. Uses diclofenac prn for gout (initially prescribed for a shoulder issue by Dr. Theda Sers).  Trigeminal neuralgia since childhood. Sometimes on the right, other times on the left. Saw neuro in the past and put on tegretol. Weaned to every other day, which still controls it. If he develops symptoms, he increases the dose back up to daily and also uses short-acting. Hasn't needed to take this in a while, at least a few months.  Vitamin D deficiency--s/p 12 weeks of prescription therapy.  Last check was slightly low at 29 when taking an OTC  Vitamin D once daily (?dose).  He is currently taking MVI plus a vitamin D supplement daily. Had labs checked prior to visit (see below).  PMH, PSH, SH reviewed and updated  Outpatient Encounter Prescriptions as of 01/06/2017  Medication Sig Note  . allopurinol (ZYLOPRIM) 300 MG tablet Take 1 tablet (300 mg total) by mouth daily.   Marland Kitchen aspirin 81 MG tablet Take 81 mg by mouth daily. 01/06/2017: Occasional holds if he is having bleeding "springing leaks"  . atorvastatin (LIPITOR) 40 MG tablet Take 1 tablet (40 mg total) by mouth daily.   . carbamazepine (TEGRETOL XR) 100 MG 12 hr tablet TAKE 1 TABLET BY MOUTH EVERY DAY 01/06/2017: Every other day  . cholecalciferol (VITAMIN D) 1000 units tablet Take 1,000 Units by mouth daily.   Marland Kitchen CINNAMON PO Take by mouth daily.   . diphenhydrAMINE (BENADRYL) 25 MG tablet Take 25 mg by mouth at bedtime as needed for sleep.    . fish oil-omega-3 fatty acids 1000 MG capsule Take 1,200 g by mouth 2 (two) times daily.   01/06/2017: Takes 2 every evening  . Icosapent Ethyl (VASCEPA) 1 g CAPS Take 2 capsules by mouth 2 (two) times daily with a meal. 01/06/2017: Taking 1 capsule every morning  . losartan (COZAAR) 25 MG tablet Take 1 tablet (25 mg total) by mouth daily.   Marland Kitchen MILK THISTLE PO Take by mouth daily.    . Multiple Vitamin (MULTIVITAMIN) tablet Take 1 tablet by mouth daily.   . colchicine 0.6 MG tablet Take 2  tablets by mouth at onset of gout flare.  May repeat 1 tablet in 1 hour if needed (max 3 tabs/24 hr) (Patient not taking: Reported on 04/28/2016)   . diclofenac (VOLTAREN) 75 MG EC tablet Take 1 tablet (75 mg total) by mouth 2 (two) times daily as needed. (Patient not taking: Reported on 01/06/2017) 01/06/2017: Uses prn gout pain (and/or other joint pains)  . fexofenadine (ALLEGRA) 180 MG tablet Take 180 mg by mouth daily. 03/24/2016: Used for hives, resolved  . Melatonin 3 MG TABS Take 3 mg by mouth as needed. 01/06/2017: Takes occasionally  . naproxen sodium (ANAPROX) 220  MG tablet Take 220 mg by mouth 2 (two) times daily with a meal. 03/24/2016: Uses Aleve prn pain, headache  . valACYclovir (VALTREX) 1000 MG tablet Take 2 tablets (2,000 mg total) by mouth 2 (two) times daily. (Patient not taking: Reported on 04/28/2016) 11/13/2014: PRN   No facility-administered encounter medications on file as of 01/06/2017.    Allergies  Allergen Reactions  . Minocycline Anaphylaxis  . Penicillins Hives    ROS: no fever, chills, weight changes, URI symptoms, headaches, dizziness, chest pain, shortness of breath, GI complaints. No current joint complaints. No urinary complaints. Moods are good. See HPI.    PHYSICAL EXAM:  BP 120/88 (BP Location: Left Arm, Patient Position: Sitting, Cuff Size: Normal)   Pulse 80   Ht 5\' 8"  (1.727 m)   Wt 197 lb 12.8 oz (89.7 kg)   BMI 30.08 kg/m   128/78  Wt Readings from Last 3 Encounters:  01/06/17 197 lb 12.8 oz (89.7 kg)  09/02/16 197 lb 12.8 oz (89.7 kg)  04/28/16 206 lb (93.4 kg)   Well appearing, pleasant male in no distress HEENT: conjunctiva and sclera are clear, EOMI, OP clear Neck: No lymphadenopathy, thyromegaly or carotid bruit Heart: regular rate and rhythm Lungs: clear bilaterally Back: no spinal or CVA tenderness Abdomen: soft, nontender, no organomegaly or mass Extremities: no edema, normal pulses Psych: normal mood, affect, hygiene and grooming Neuro: alert and oriented, cranial nerves intact, normal gait   Lab Results  Component Value Date   HGBA1C 6.5 (H) 01/05/2017     Chemistry      Component Value Date/Time   NA 136 01/05/2017 0757   K 4.8 01/05/2017 0757   CL 104 01/05/2017 0757   CO2 22 01/05/2017 0757   BUN 19 01/05/2017 0757   CREATININE 0.90 01/05/2017 0757      Component Value Date/Time   CALCIUM 9.2 01/05/2017 0757   ALKPHOS 44 01/05/2017 0757   AST 20 01/05/2017 0757   ALT 30 01/05/2017 0757   BILITOT 0.6 01/05/2017 0757     Fasting glucose 133  Vitamin D-OH 32  Lab  Results  Component Value Date   CHOL 143 01/05/2017   HDL 49 01/05/2017   LDLCALC 55 01/05/2017   LDLDIRECT 126.5 02/18/2012   TRIG 194 (H) 01/05/2017   CHOLHDL 2.9 01/05/2017     ASSESSMENT/PLAN:  Mixed hyperlipidemia due to type 2 diabetes mellitus (HCC) - TG remains elevated. Increase Vascepa to 2 in am, okay to continue 2 fish oil in PM. Lowfat diet, exercise, weight loss  Type 2 diabetes mellitus with microalbuminuria, without long-term current use of insulin (HCC) - diet controlled; A1c slightly higher. reviewed diet, exercise, weight loss; continue ARB  Vitamin D deficiency - resolved; continue current supplements  Need for shingles vaccine - risks/side effects reviewed - Plan: Varicella-zoster vaccine IM (Shingrix)  Obesity (BMI 30.0-34.9) - counseled  re: diet, exercise, weight loss  Essential hypertension - BP controlled; continue ARB   shingrix today  Dr. Thom Chimes at [facility]--please call for diabetic eye exam done in the Fall.

## 2017-01-06 ENCOUNTER — Ambulatory Visit (INDEPENDENT_AMBULATORY_CARE_PROVIDER_SITE_OTHER): Payer: Federal, State, Local not specified - PPO | Admitting: Family Medicine

## 2017-01-06 ENCOUNTER — Encounter: Payer: Self-pay | Admitting: Family Medicine

## 2017-01-06 VITALS — BP 128/78 | HR 80 | Ht 68.0 in | Wt 197.8 lb

## 2017-01-06 DIAGNOSIS — Z23 Encounter for immunization: Secondary | ICD-10-CM

## 2017-01-06 DIAGNOSIS — E669 Obesity, unspecified: Secondary | ICD-10-CM

## 2017-01-06 DIAGNOSIS — I10 Essential (primary) hypertension: Secondary | ICD-10-CM

## 2017-01-06 DIAGNOSIS — E1169 Type 2 diabetes mellitus with other specified complication: Secondary | ICD-10-CM | POA: Diagnosis not present

## 2017-01-06 DIAGNOSIS — E1129 Type 2 diabetes mellitus with other diabetic kidney complication: Secondary | ICD-10-CM | POA: Diagnosis not present

## 2017-01-06 DIAGNOSIS — R809 Proteinuria, unspecified: Secondary | ICD-10-CM

## 2017-01-06 DIAGNOSIS — E782 Mixed hyperlipidemia: Secondary | ICD-10-CM

## 2017-01-06 DIAGNOSIS — E559 Vitamin D deficiency, unspecified: Secondary | ICD-10-CM

## 2017-01-06 LAB — VITAMIN D 25 HYDROXY (VIT D DEFICIENCY, FRACTURES): VIT D 25 HYDROXY: 32 ng/mL (ref 30–100)

## 2017-01-06 LAB — HEMOGLOBIN A1C
Hgb A1c MFr Bld: 6.5 % — ABNORMAL HIGH (ref <5.7)
Mean Plasma Glucose: 140 mg/dL

## 2017-01-06 NOTE — Patient Instructions (Signed)
Increase your prescription omega-3 to 2 capsules in the morning.  Okay to continue the over-the-counter fish oil in the evening.  Check to see if there is a price difference for Lovaza vs Vascepa, and compare pharmacies.  Let me know if you need your prescription changed.

## 2017-01-10 ENCOUNTER — Encounter: Payer: Self-pay | Admitting: Family Medicine

## 2017-01-10 DIAGNOSIS — G500 Trigeminal neuralgia: Secondary | ICD-10-CM

## 2017-01-10 MED ORDER — CARBAMAZEPINE 100 MG PO CHEW: CHEWABLE_TABLET | ORAL | 1 refills | Status: DC

## 2017-01-11 ENCOUNTER — Encounter: Payer: Self-pay | Admitting: Family Medicine

## 2017-01-12 ENCOUNTER — Encounter: Payer: Self-pay | Admitting: *Deleted

## 2017-02-23 ENCOUNTER — Other Ambulatory Visit: Payer: Self-pay | Admitting: Family Medicine

## 2017-02-23 NOTE — Telephone Encounter (Signed)
I agree that this medication is likely less expensive--this was discussed with patient in the past.  I'm pretty sure he doesn't want to use [facility] for this. [facility] Please double check if he would like to make this change, and have [facility] than [facility].  If he agrees to this change, delete the Vascepa from his med list.

## 2017-02-23 NOTE — Telephone Encounter (Signed)
Recv'd fax from [place] patient requesting generic Lovaza as it may be less expensive please send in new Rx if this change is ok

## 2017-02-24 MED ORDER — OMEGA-3-ACID ETHYL ESTERS 1 G PO CAPS: 2.0000 g | ORAL_CAPSULE | Freq: Two times a day (BID) | ORAL | 5 refills | Status: DC

## 2017-02-24 NOTE — Telephone Encounter (Signed)
Spoke to patient and he would like it sent to [facility]. I have deleted the other med out of med list

## 2017-03-04 ENCOUNTER — Other Ambulatory Visit: Payer: Self-pay | Admitting: Family Medicine

## 2017-03-04 DIAGNOSIS — M109 Gout, unspecified: Secondary | ICD-10-CM

## 2017-03-04 DIAGNOSIS — E1169 Type 2 diabetes mellitus with other specified complication: Secondary | ICD-10-CM

## 2017-03-04 DIAGNOSIS — G500 Trigeminal neuralgia: Secondary | ICD-10-CM

## 2017-03-04 DIAGNOSIS — E782 Mixed hyperlipidemia: Secondary | ICD-10-CM

## 2017-03-04 DIAGNOSIS — E790 Hyperuricemia without signs of inflammatory arthritis and tophaceous disease: Secondary | ICD-10-CM

## 2017-03-04 NOTE — Telephone Encounter (Signed)
Is this okay to refill? 

## 2017-03-07 ENCOUNTER — Other Ambulatory Visit: Payer: Self-pay | Admitting: Family Medicine

## 2017-03-07 DIAGNOSIS — E1129 Type 2 diabetes mellitus with other diabetic kidney complication: Secondary | ICD-10-CM

## 2017-03-07 DIAGNOSIS — R809 Proteinuria, unspecified: Principal | ICD-10-CM

## 2017-04-13 ENCOUNTER — Other Ambulatory Visit (INDEPENDENT_AMBULATORY_CARE_PROVIDER_SITE_OTHER): Payer: Medicare Other

## 2017-04-13 DIAGNOSIS — Z23 Encounter for immunization: Secondary | ICD-10-CM | POA: Diagnosis not present

## 2017-05-06 ENCOUNTER — Other Ambulatory Visit: Payer: Self-pay | Admitting: Family Medicine

## 2017-05-06 DIAGNOSIS — G500 Trigeminal neuralgia: Secondary | ICD-10-CM

## 2017-05-06 NOTE — Telephone Encounter (Signed)
Is this ok to refill?  

## 2017-06-07 ENCOUNTER — Other Ambulatory Visit: Payer: Self-pay | Admitting: Family Medicine

## 2017-06-07 DIAGNOSIS — M109 Gout, unspecified: Secondary | ICD-10-CM

## 2017-06-07 DIAGNOSIS — E782 Mixed hyperlipidemia: Secondary | ICD-10-CM

## 2017-06-07 DIAGNOSIS — E790 Hyperuricemia without signs of inflammatory arthritis and tophaceous disease: Secondary | ICD-10-CM

## 2017-06-07 DIAGNOSIS — E1169 Type 2 diabetes mellitus with other specified complication: Secondary | ICD-10-CM

## 2017-06-30 LAB — HM DIABETES EYE EXAM

## 2017-07-07 ENCOUNTER — Encounter: Payer: Self-pay | Admitting: Family Medicine

## 2017-08-18 DIAGNOSIS — L821 Other seborrheic keratosis: Secondary | ICD-10-CM | POA: Diagnosis not present

## 2017-08-18 DIAGNOSIS — C44629 Squamous cell carcinoma of skin of left upper limb, including shoulder: Secondary | ICD-10-CM | POA: Diagnosis not present

## 2017-08-18 DIAGNOSIS — Z85828 Personal history of other malignant neoplasm of skin: Secondary | ICD-10-CM | POA: Diagnosis not present

## 2017-08-18 DIAGNOSIS — D485 Neoplasm of uncertain behavior of skin: Secondary | ICD-10-CM | POA: Diagnosis not present

## 2017-08-25 ENCOUNTER — Encounter: Payer: Self-pay | Admitting: Family Medicine

## 2017-09-01 ENCOUNTER — Other Ambulatory Visit: Payer: Self-pay | Admitting: Family Medicine

## 2017-09-01 DIAGNOSIS — E1129 Type 2 diabetes mellitus with other diabetic kidney complication: Secondary | ICD-10-CM

## 2017-09-01 DIAGNOSIS — R809 Proteinuria, unspecified: Principal | ICD-10-CM

## 2017-09-05 ENCOUNTER — Other Ambulatory Visit: Payer: Self-pay | Admitting: Family Medicine

## 2017-09-05 DIAGNOSIS — E790 Hyperuricemia without signs of inflammatory arthritis and tophaceous disease: Secondary | ICD-10-CM

## 2017-09-05 DIAGNOSIS — E782 Mixed hyperlipidemia: Secondary | ICD-10-CM

## 2017-09-05 DIAGNOSIS — E1169 Type 2 diabetes mellitus with other specified complication: Secondary | ICD-10-CM

## 2017-09-05 DIAGNOSIS — M109 Gout, unspecified: Secondary | ICD-10-CM

## 2017-09-07 NOTE — Progress Notes (Signed)
Chief Complaint  Patient presents with  . Medicare Wellness    fasting Welcome to Medicare visit. Had eye exam in Dec. Has noticed that his hands have been shaking with fine motor things and his dad had Parkinson's so he is concerned. Feet and ankles have been bothering him and started taking glucosamine which helps but has caused gout flares.     Brett Johnson is a 67 y.o. male who presents for Welcome to Medicare visit and follow-up on chronic medical conditions.  He has the following concerns:  Feet and ankle pain. "all over", denies swelling, redness.  Had some pain getting out of bed, after long car ride. Denies heel pain. Feels better after he gets moving.  No pain with activity or weight-bearing.  Glucosamine helps, but caused gout flare. It helps enough that he continues to take glucosamine (at lower dose).  Recent cough/hoarseness (see MyChart message from 1/24)--finally got better witin the last week or so. He used some Allegra. Wife now has the same thing.  He had some mucus and congestion, cough at night. No discolored mucus, sinus pain, shortness of breath.  Tremor in hands--first noticed in the spring, before retiring.  Noticed tremor while giving a speech, had a hard time separating pages of paper. (hadn't eaten, had a lot of coffee). Slight shake even now if separating two pages. If it wasn't for his father's PD, he would blame coffee. He is concerned due to father having Parkinson's disease.  He admits that his father's tremor was resting, different.  Woke up one day in early January, felt achey x 24 hours, stayed in bed.  No other symptoms, possibly lowgrade fever.  Some easy bruising/bleeding, "springing leaks" usually arms.  Once not too long ago noted the penile shaft appeared purple (a round area of discoloration), no associated pain or significant swelling, lasted a week. Slightly puffy this past time (not the first time, this also happened one other time, years ago).   Had no symptoms, just noticed the discoloration while in the shower. Denies any trauma.  Diabetes: diet controlled; doesn't check blood sugars. Denies polydipsia, polyuria. UTD on diabetic eye exam, had in 06/2017. Checks his feet regularly without concerns. Tries to make good choices at home, eats out and travels more frequently.  2-3 alcoholic beverages daily. Lab Results  Component Value Date   HGBA1C 6.5 (H) 01/05/2017    Hyperlipidemia: Following a lowfat, low cholesterol diet. Eats healthy overall. Travels frequently, sometimes isn't as good in the restaurants. Tolerating statin without side effect. Vascepa was added, too expensive so was only taking 1/d (when labs checked below). Since then, it was changed to Lovaza, which is inexpensive (takes 2 in the morning, and 2 OTC fish oil in the evening, until he runs out, then plans to take 2 BID of Lovaza). Due for recheck.   Lab Results  Component Value Date   CHOL 143 01/05/2017   HDL 49 01/05/2017   LDLCALC 55 01/05/2017   LDLDIRECT 126.5 02/18/2012   TRIG 194 (H) 01/05/2017   CHOLHDL 2.9 01/05/2017   Hypertension follow-up: Blood pressures elsewhere 123-146/61-87, most often low 130's/70's.  Tolerating losartan without side effects.He had missed a few doses (fell asleep before taking), so changed to taking in the morning and is better about remembering.Denies dizziness, headaches, chest pain.   Gout--taking allopurinol.  In the last month, when he started glucosamine/chondroitin, he had 2 flares.  He feels the benefit from the supplement, prefers to continue.  Diclofenac was helpful, resolved in a day (right knee, right elbow were locations of last flares).  Previous flares have been in toes, ankle.  Trigeminal neuralgia since childhood. Sometimes on the right, other times on the left. Saw neuro in the past and put on tegretol. Weaned to every other day, which still controls it. If he develops symptoms, he increases the dose  back up to daily and also uses short-acting. Hasn't had a flare in about a year.  Vitamin D deficiency--s/p 12 weeks of prescription therapy in the past. Last check was slightly low at 32 in June 2018, when taking Vit D and MVI daily; it had been 29 when taking an OTC Vitamin D once daily without MVI.   He is currently taking MVI (misses periodically, takes about 4x/wk), consistent with the vitamin D.  He isn't exactly sure of the current D3 dose he has at home. May have different doses at his different homes ([address], [place]).   Immunization History  Administered Date(s) Administered  . Influenza Split 05/11/2012  . Influenza, High Dose Seasonal PF 04/13/2017  . Influenza,inj,Quad PF,6+ Mos 04/23/2013, 05/06/2014, 05/12/2015, 04/28/2016  . Pneumococcal Conjugate-13 04/28/2016  . Td 04/28/2016  . Tdap 05/02/2006  . Zoster 03/05/2013  . Zoster Recombinat (Shingrix) 01/06/2017, 04/13/2017   Last colonoscopy: 2012, diverticulosis Last PSA:04/2016 Lab Results  Component Value Date   PSA 0.6 04/15/2016   PSA 0.66 11/13/2014   PSA 0.69 01/02/2014   Dentist:every 6 months  Ophtho: yearly Exercise:  Walks daily 45-50 minutes, most days. Gets at least 10K steps most days, weather-permitting.  Light weights and pushups occasionally at home.  Swims in the summer only. :  Other doctors caring for patient include: GI: Dr. Lucio Edward Dentist: Dr. Satira Sark Ophtho: Dr. Thom Chimes (at [facility]) Derm: [facility] Ortho: Dr. Theda Sers Cardiologist, allergist and neuro many years ago  Depression screen:  Negative Fall Screen: Negative Functional Status Survey: unremarkable See full screens in epic   End of Life Discussion:  Patient has a living will and medical power of attorney  Past Medical History:  Diagnosis Date  . Cancer ([facility])    Basal cell cancer; followed annually by dermatology/[facility].  . Glucose intolerance (impaired glucose tolerance)   . Gout 08/02/2012  . Herpes  labialis   . Squamous cell carcinoma of skin 2017   right arm, removed by Dr. Martinique  . Trigeminal neuralgia of left side of face    since childhood. controlled by tegretol (has had it on both sides in the past)    Past Surgical History:  Procedure Laterality Date  . COLONOSCOPY  11/01/2010   normal; repeat in 10 years.  [facility].   Social History   Socioeconomic History  . Marital status: Married    Spouse name: Not on file  . Number of children: Not on file  . Years of education: Not on file  . Highest education level: Not on file  Social Needs  . Financial resource strain: Not on file  . Food insecurity - worry: Not on file  . Food insecurity - inability: Not on file  . Transportation needs - medical: Not on file  . Transportation needs - non-medical: Not on file  Occupational History  . Not on file  Tobacco Use  . Smoking status: Never Smoker  . Smokeless tobacco: Never Used  Substance and Sexual Activity  . Alcohol use: Yes    Alcohol/week: 7.0 oz    Types: 14 Standard drinks or equivalent  per week    Comment: 2-3 glasses of wine or beer/d  . Drug use: No  . Sexual activity: Yes    Partners: Female  Other Topics Concern  . 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  40 -50 hours per week. Appointment will be ended Spring 2018   2 granddaughters in [place].   Wife also retired spring 2018      Tobacco: none      Alcohol:  2-3 glasses of wine daily      Drugs: none      Exercising: walking 30-60 minutes daily. No regular weight-bearing exercise      Seatbelt: 100%      Guns:  Loaded partially secured; no children in house.      Sunscreen:  SPF 30-50.      Dermatologist:  [facility]   Gastroenterologist:  [facility].   OrthoTheda Sers   OphthalmThom Chimes    Family History  Problem Relation Age of Onset  . Colon polyps Father   . Diabetes Father   . Arthritis Father   . Heart disease Father        valve replacement.  . Hyperlipidemia Father   . Cancer Father        skin  . Parkinson's disease Father   . Pulmonary embolism Mother   . Heart disease Maternal Grandfather   . Diabetes Brother   . Hypertension Brother   . Hyperlipidemia Brother   . Cancer Brother        skin  . Kidney Stones Daughter     Outpatient Encounter Medications as of 09/08/2017  Medication Sig Note  . allopurinol (ZYLOPRIM) 300 MG tablet Take 1 tablet (300 mg total) by mouth daily.   Marland Kitchen aspirin 81 MG tablet Take 81 mg by mouth daily. 01/06/2017: Occasional holds if he is having bleeding "springing leaks"  . atorvastatin (LIPITOR) 40 MG tablet Take 1 tablet (40 mg total) by mouth daily.   . carbamazepine (TEGRETOL XR) 100 MG 12 hr tablet TAKE 1 TABLET BY MOUTH EVERY DAY 09/08/2017: Takes every other day (changes to qd only if flare of trigeminal neuralgia)  . cholecalciferol (VITAMIN D) 1000 units tablet Take 1,000 Units by mouth daily. 09/08/2017: Unsure of dose--may have different doses at different places he stays  . CINNAMON PO Take by mouth daily.   . diclofenac (VOLTAREN) 75 MG EC tablet Take 1 tablet (75 mg total) by mouth 2 (two) times daily as needed. 01/06/2017: Uses prn gout pain (and/or other joint pains)  . diphenhydrAMINE (BENADRYL) 25 MG tablet Take 25 mg by mouth at bedtime as needed for sleep.    . fish oil-omega-3 fatty acids 1000 MG capsule Take 2 g by mouth 2 (two) times daily.  01/06/2017: Takes 2 every evening  . glucosamine-chondroitin 500-400 MG tablet Take 2 tablets by mouth 3 (three) times daily. 09/08/2017: Takes 1-2 pills/day  . losartan (COZAAR) 25 MG tablet TAKE 1 TABLET (25 MG TOTAL) BY MOUTH DAILY.   . Melatonin 3 MG TABS Take 3 mg by mouth as needed. 01/06/2017: Takes occasionally  . MILK THISTLE PO Take by mouth daily.  09/08/2017:  occasionally  . Multiple Vitamin (MULTIVITAMIN) tablet Take 1 tablet by mouth daily. 09/08/2017:  Remembers about 4x/week  . omega-3 acid ethyl esters (LOVAZA) 1 g capsule Take 2 capsules (2 g total) by mouth 2 (two) times daily. 09/08/2017: Takes 2 in the morning (and OTC fish oil 2/d at night--when he runs out he will change to 2 BID)  . [DISCONTINUED] allopurinol (ZYLOPRIM) 300 MG tablet TAKE 1 TABLET (300 MG TOTAL) BY MOUTH DAILY.   . [DISCONTINUED] atorvastatin (LIPITOR) 40 MG tablet TAKE 1 TABLET (40 MG TOTAL) BY MOUTH DAILY.   . carbamazepine (TEGRETOL) 100 MG chewable tablet Use as directed prn for trigeminal neuralgia flare (Patient not taking: Reported on 09/08/2017)   . colchicine 0.6 MG tablet Take 2 tablets by mouth at onset of gout flare.  May repeat 1 tablet in 1 hour if needed (max 3 tabs/24 hr) (Patient not taking: Reported on 04/28/2016)   . fexofenadine (ALLEGRA) 180 MG tablet Take 180 mg by mouth daily. 09/08/2017: Used for hives, resolved; uses prn allergies (rare)  . naproxen sodium (ANAPROX) 220 MG tablet Take 220 mg by mouth 2 (two) times daily with a meal. 03/24/2016: Uses Aleve prn pain, headache  . valACYclovir (VALTREX) 1000 MG tablet Take 2 tablets (2,000 mg total) by mouth 2 (two) times daily. (Patient not taking: Reported on 04/28/2016) 11/13/2014: PRN   No facility-administered encounter medications on file as of 09/08/2017.     Allergies  Allergen Reactions  . Minocycline Anaphylaxis  . Penicillins Hives    ROS: The patient denies anorexia, fever, weight changes (lost some over the summer and regained), headaches,  vision loss, decreased hearing, ear pain, chest pain, palpitations, dizziness, syncope, dyspnea on exertion, swelling, nausea, vomiting, diarrhea, constipation, abdominal pain, melena, hematochezia, indigestion/heartburn, hematuria, incontinence, erectile dysfunction, nocturia, weakened urine stream, dysuria, genital lesions, joint pains (none currently, elbow pain  resolved), numbness, tingling, weakness, suspicious skin lesions, depression, anxiety, abnormal bleeding/bruising, or enlarged lymph nodes.  Insomnia is managed with OTC meds prn Foot/ankle pain, tremor, bruising, cough/hoarseness per HPI. Very slight tremor, only when separating pages of paper, otherwise not noticeable.      PHYSICAL EXAM:  BP (!) 142/86   Pulse 76   Ht 5\' 9"  (1.753 m)   Wt 197 lb (89.4 kg)   BMI 29.09 kg/m   150/84 on repeat by MD  Wt Readings from Last 3 Encounters:  09/08/17 197 lb (89.4 kg)  01/06/17 197 lb 12.8 oz (89.7 kg)  09/02/16 197 lb 12.8 oz (89.7 kg)    General Appearance:    Alert, cooperative, no distress, appears stated age  Head:    Normocephalic, without obvious abnormality, atraumatic  Eyes:    PERRL, conjunctiva/corneas clear, EOM's intact, fundi benign  Ears:    Normal TM's and external ear canals  Nose:   Nares normal, mucosa is moderately edematous with clear-white mucus in nares; no sinus tenderness  Throat:   Lips, mucosa, and tongue normal; teeth and gums normal  Neck:   Supple, no lymphadenopathy;  thyroid:  no enlargement/tenderness/ nodules; no carotid bruit or JVD  Back:    Spine nontender, no curvature, ROM normal, no CVA tenderness  Lungs:     Clear to auscultation bilaterally without wheezes, rales or ronchi; respirations unlabored  Chest Wall:    No tenderness or deformity   Heart:    Regular rate and rhythm, S1 and S2 normal, no murmur, rub or gallop.  Breast Exam:    No chest wall tenderness, masses or gynecomastia  Abdomen:     Soft, non-tender, nondistended, normoactive bowel  sounds, no masses, no hepatosplenomegaly  Genitalia:    Normal male external genitalia without lesions. Testicles without masses. No inguinal hernias.  Rectal:    Normal sphincter tone, no masses or tenderness; guaiac negative stool.  Prostate smooth, no nodules, not enlarged.  Extremities:   No clubbing, cyanosis or edema  Pulses:   2+ and  symmetric all extremities  Skin:   Skin color, texture, turgor normal, no rashes   Lymph nodes:   Cervical, supraclavicular, and axillary nodes normal  Neurologic:   CNII-XII intact, normal strength, sensation and gait; reflexes 2+ and symmetric throughout                                Psych:   Normal mood, affect, hygiene and grooming.    Diabetic foot exam--normal  Lab Results  Component Value Date   HGBA1C 6.6 09/08/2017   EKG: significant change from last EKG in system, with very poor R wave progression.    ASSESSMENT/PLAN:  Medicare welcome visit - Plan: POCT Urinalysis DIP (Proadvantage Device), PSA, EKG 12-Lead  Type 2 diabetes mellitus with microalbuminuria, without long-term current use of insulin (HCC) - diet-controlled; discussed/offered Metformin, declines at this time. Encouraged weight loss, cut back on alcohol - Plan: HgB A1c, Microalbumin / creatinine urine ratio, TSH, Comprehensive metabolic panel  Essential hypertension - elevated today, better at home. Cont to monitor. Consider increase losartan--if BP's remain borderline, definitely if microalbumin high - Plan: Comprehensive metabolic panel  Mixed hyperlipidemia due to type 2 diabetes mellitus ([place]) - improved on current regimen including Lovaza and atorvastatin - Plan: Lipid panel, atorvastatin (LIPITOR) 40 MG tablet  Obesity (BMI 30.0-34.9) - counseled re: diet, exercise, cutting back on alcohol, weight loss  Vitamin D deficiency - Plan: VITAMIN D 25 Hydroxy (Vit-D Deficiency, Fractures)  Acute gout of hand, unspecified cause, unspecified laterality - mild flares when glucosamine added; recheck uric acid level is stil at goal. Cut back alcohol.  Cont NSAID and/or colchicine prn - Plan: Uric acid, allopurinol (ZYLOPRIM) 300 MG tablet  Screening for prostate cancer - Plan: PSA  Medication monitoring encounter - Plan: Microalbumin / creatinine urine ratio, VITAMIN D 25 Hydroxy (Vit-D Deficiency, Fractures), CBC  with Differential/Platelet, Comprehensive metabolic panel, Lipid panel, Uric acid  Need for pneumococcal vaccination - Plan: Pneumococcal polysaccharide vaccine 23-valent greater than or equal to 2yo subcutaneous/IM  Acute gout of hand, unspecified cause, unspecified laterality - Plan: Uric acid, allopurinol (ZYLOPRIM) 300 MG tablet  Elevated uric acid in blood - Plan: allopurinol (ZYLOPRIM) 300 MG tablet  Mixed hyperlipidemia due to type 2 diabetes mellitus ([place]) - Plan: Lipid panel, atorvastatin (LIPITOR) 40 MG tablet  Abnormal EKG - refer to cardiology; will need eval, echo   Discussed PSA screening (risks/benefits), recommended at least 30 minutes of aerobic activity at least 5 days/week; proper sunscreen use reviewed; healthy diet and alcohol recommendations (less than or equal to 2 drinks/day) reviewed; regular seatbelt use; changing batteries in smoke detectors. Self-testicular exams. Immunization recommendations discussed--Pneumovax today; continue yearly high dose flu shots. Colonoscopy recommendations reviewed, UTD  Try and cut back alcohol to 1-2/day, rather than 2-3.   Medicare Attestation I have personally reviewed: The patient's medical and social history Their use of alcohol, tobacco or illicit drugs Their current medications and supplements The patient's functional ability including ADLs,fall risks, home safety risks, cognitive, and hearing and visual impairment Diet and physical activities Evidence for depression or mood disorders  The patient's weight, height and BMI have been recorded in the chart.  I have made referrals, counseling, and provided education to the patient based on review of the above and I have provided the patient with a written personalized care plan for preventive services.

## 2017-09-08 ENCOUNTER — Encounter: Payer: Self-pay | Admitting: Family Medicine

## 2017-09-08 ENCOUNTER — Ambulatory Visit (INDEPENDENT_AMBULATORY_CARE_PROVIDER_SITE_OTHER): Payer: Medicare Other | Admitting: Family Medicine

## 2017-09-08 ENCOUNTER — Other Ambulatory Visit: Payer: Self-pay | Admitting: *Deleted

## 2017-09-08 VITALS — BP 142/86 | HR 76 | Ht 69.0 in | Wt 197.0 lb

## 2017-09-08 DIAGNOSIS — R809 Proteinuria, unspecified: Secondary | ICD-10-CM

## 2017-09-08 DIAGNOSIS — E1169 Type 2 diabetes mellitus with other specified complication: Secondary | ICD-10-CM

## 2017-09-08 DIAGNOSIS — M109 Gout, unspecified: Secondary | ICD-10-CM | POA: Diagnosis not present

## 2017-09-08 DIAGNOSIS — E66811 Obesity, class 1: Secondary | ICD-10-CM

## 2017-09-08 DIAGNOSIS — Z5181 Encounter for therapeutic drug level monitoring: Secondary | ICD-10-CM

## 2017-09-08 DIAGNOSIS — I10 Essential (primary) hypertension: Secondary | ICD-10-CM

## 2017-09-08 DIAGNOSIS — R9431 Abnormal electrocardiogram [ECG] [EKG]: Secondary | ICD-10-CM

## 2017-09-08 DIAGNOSIS — E669 Obesity, unspecified: Secondary | ICD-10-CM

## 2017-09-08 DIAGNOSIS — E782 Mixed hyperlipidemia: Secondary | ICD-10-CM

## 2017-09-08 DIAGNOSIS — E790 Hyperuricemia without signs of inflammatory arthritis and tophaceous disease: Secondary | ICD-10-CM | POA: Diagnosis not present

## 2017-09-08 DIAGNOSIS — Z0000 Encounter for general adult medical examination without abnormal findings: Secondary | ICD-10-CM | POA: Diagnosis not present

## 2017-09-08 DIAGNOSIS — E1129 Type 2 diabetes mellitus with other diabetic kidney complication: Secondary | ICD-10-CM | POA: Diagnosis not present

## 2017-09-08 DIAGNOSIS — E559 Vitamin D deficiency, unspecified: Secondary | ICD-10-CM | POA: Diagnosis not present

## 2017-09-08 DIAGNOSIS — Z23 Encounter for immunization: Secondary | ICD-10-CM

## 2017-09-08 DIAGNOSIS — Z125 Encounter for screening for malignant neoplasm of prostate: Secondary | ICD-10-CM | POA: Diagnosis not present

## 2017-09-08 LAB — POCT URINALYSIS DIP (PROADVANTAGE DEVICE)
BILIRUBIN UA: NEGATIVE
GLUCOSE UA: NEGATIVE mg/dL
Leukocytes, UA: NEGATIVE
Nitrite, UA: NEGATIVE
PH UA: 6 (ref 5.0–8.0)
RBC UA: NEGATIVE
SPECIFIC GRAVITY, URINE: 1.025
Urobilinogen, Ur: NEGATIVE

## 2017-09-08 LAB — POCT GLYCOSYLATED HEMOGLOBIN (HGB A1C): HEMOGLOBIN A1C: 6.6

## 2017-09-08 NOTE — Patient Instructions (Addendum)
  HEALTH MAINTENANCE RECOMMENDATIONS:  It is recommended that you get at least 30 minutes of aerobic exercise at least 5 days/week (for weight loss, you may need as much as 60-90 minutes). This can be any activity that gets your heart rate up. This can be divided in 10-15 minute intervals if needed, but try and build up your endurance at least once a week.  Weight bearing exercise is also recommended twice weekly.  Eat a healthy diet with lots of vegetables, fruits and fiber.  "Colorful" foods have a lot of vitamins (ie green vegetables, tomatoes, red peppers, etc).  Limit sweet tea, regular sodas and alcoholic beverages, all of which has a lot of calories and sugar.  Up to 2 alcoholic drinks daily may be beneficial for men (unless trying to lose weight, watch sugars).  Drink a lot of water.  Sunscreen of at least SPF 30 should be used on all sun-exposed parts of the skin when outside between the hours of 10 am and 4 pm (not just when at beach or pool, but even with exercise, golf, tennis, and yard work!)  Use a sunscreen that says "broad spectrum" so it covers both UVA and UVB rays, and make sure to reapply every 1-2 hours.  Remember to change the batteries in your smoke detectors when changing your clock times in the spring and fall.  Use your seat belt every time you are in a car, and please drive safely and not be distracted with cell phones and texting while driving.    Mr. Martis , Thank you for taking time to come for your Welcome to Medicare Visit. I appreciate your ongoing commitment to your health goals. Please review the following plan we discussed and let me know if I can assist you in the future.   These are the goals we discussed: Goals    None      This is a list of the screening recommended for you and due dates:  Health Maintenance  Topic Date Due  . Pneumonia vaccines (2 of 2 - PPSV23) 04/28/2017  . Hemoglobin A1C  07/07/2017  . Complete foot exam   09/02/2017  . Eye  exam for diabetics  06/30/2018  . Colon Cancer Screening  11/16/2020  . Tetanus Vaccine  04/28/2026  . Flu Shot  Completed  .  Hepatitis C: One time screening is recommended by Center for Disease Control  (CDC) for  adults born from [birth year] through 1964.   Completed  . HIV Screening  Completed   You were given a pneumovax today. A1c and foot exam were done today  Try and cut back alcohol to 1-2/day, rather than 2-3.

## 2017-09-09 ENCOUNTER — Encounter: Payer: Self-pay | Admitting: Family Medicine

## 2017-09-09 LAB — CBC WITH DIFFERENTIAL/PLATELET
BASOS ABS: 0 10*3/uL (ref 0.0–0.2)
Basos: 1 %
EOS (ABSOLUTE): 0.1 10*3/uL (ref 0.0–0.4)
Eos: 2 %
HEMOGLOBIN: 13.8 g/dL (ref 13.0–17.7)
Hematocrit: 43 % (ref 37.5–51.0)
IMMATURE GRANULOCYTES: 0 %
Immature Grans (Abs): 0 10*3/uL (ref 0.0–0.1)
LYMPHS ABS: 1 10*3/uL (ref 0.7–3.1)
Lymphs: 25 %
MCH: 31.2 pg (ref 26.6–33.0)
MCHC: 32.1 g/dL (ref 31.5–35.7)
MCV: 97 fL (ref 79–97)
MONOCYTES: 15 %
Monocytes Absolute: 0.6 10*3/uL (ref 0.1–0.9)
NEUTROS PCT: 57 %
Neutrophils Absolute: 2.3 10*3/uL (ref 1.4–7.0)
Platelets: 155 10*3/uL (ref 150–379)
RBC: 4.42 x10E6/uL (ref 4.14–5.80)
RDW: 13.9 % (ref 12.3–15.4)
WBC: 3.9 10*3/uL (ref 3.4–10.8)

## 2017-09-09 LAB — COMPREHENSIVE METABOLIC PANEL
ALBUMIN: 4.3 g/dL (ref 3.6–4.8)
ALK PHOS: 54 IU/L (ref 39–117)
ALT: 36 IU/L (ref 0–44)
AST: 31 IU/L (ref 0–40)
Albumin/Globulin Ratio: 1.8 (ref 1.2–2.2)
BUN/Creatinine Ratio: 20 (ref 10–24)
BUN: 16 mg/dL (ref 8–27)
Bilirubin Total: 0.5 mg/dL (ref 0.0–1.2)
CALCIUM: 8.9 mg/dL (ref 8.6–10.2)
CO2: 21 mmol/L (ref 20–29)
CREATININE: 0.8 mg/dL (ref 0.76–1.27)
Chloride: 101 mmol/L (ref 96–106)
GFR calc Af Amer: 108 mL/min/{1.73_m2} (ref >59)
GFR calc non Af Amer: 94 mL/min/{1.73_m2} (ref >59)
Globulin, Total: 2.4 g/dL (ref 1.5–4.5)
Glucose: 149 mg/dL — ABNORMAL HIGH (ref 65–99)
Potassium: 4.3 mmol/L (ref 3.5–5.2)
Sodium: 140 mmol/L (ref 134–144)
Total Protein: 6.7 g/dL (ref 6.0–8.5)

## 2017-09-09 LAB — MICROALBUMIN / CREATININE URINE RATIO
Creatinine, Urine: 208.4 mg/dL
MICROALBUM., U, RANDOM: 44.2 ug/mL
Microalb/Creat Ratio: 21.2 mg/g creat (ref 0.0–30.0)

## 2017-09-09 LAB — LIPID PANEL
CHOLESTEROL TOTAL: 128 mg/dL (ref 100–199)
Chol/HDL Ratio: 2.5 ratio (ref 0.0–5.0)
HDL: 52 mg/dL (ref >39)
LDL CALC: 56 mg/dL (ref 0–99)
TRIGLYCERIDES: 101 mg/dL (ref 0–149)
VLDL Cholesterol Cal: 20 mg/dL (ref 5–40)

## 2017-09-09 LAB — TSH: TSH: 0.628 u[IU]/mL (ref 0.450–4.500)

## 2017-09-09 LAB — VITAMIN D 25 HYDROXY (VIT D DEFICIENCY, FRACTURES): VIT D 25 HYDROXY: 28 ng/mL — AB (ref 30.0–100.0)

## 2017-09-09 LAB — URIC ACID: Uric Acid: 4.9 mg/dL (ref 3.7–8.6)

## 2017-09-09 LAB — PSA: PROSTATE SPECIFIC AG, SERUM: 0.7 ng/mL (ref 0.0–4.0)

## 2017-09-09 MED ORDER — ALLOPURINOL 300 MG PO TABS: 300.0000 mg | ORAL_TABLET | Freq: Every day | ORAL | 3 refills | Status: DC

## 2017-09-09 MED ORDER — ATORVASTATIN CALCIUM 40 MG PO TABS: 40.0000 mg | ORAL_TABLET | Freq: Every day | ORAL | 3 refills | Status: DC

## 2017-09-12 NOTE — Progress Notes (Signed)
Cardiology Office Note    Date:  09/13/2017   ID:  Brett Johnson, DOB Apr 28, 1951, MRN [id number]  PCP:  Rita Ohara, MD  Cardiologist: Sinclair Grooms, MD   Chief Complaint  Patient presents with  . Follow-up    EKG    History of Present Illness:  Brett Johnson is a 67 y.o. male referred for evaluation of abnormal ECG, referred by Dr. Rita Ohara.  EKG demonstrates poor R wave progression raising the question of anterolateral infarction.  Despite this, he has no complaints consistent with ischemia.  He does have significant risk factors including glucose intolerance, hyperlipidemia, and elevated blood pressure.  His medication regimen includes an ARB for kidney protection.  He is also on atorvastatin.  Prior cardiac workup in 2008 with stress echo did not demonstrate any evidence of ischemia.  There was adequate exercise tolerance.  Stress test was done at that time because of a "abnormal EKG".  Paternal grandfather had coronary disease.  Clinical follow-up will be pending findings on the stress myocardial perfusion study   Past Medical History:  Diagnosis Date  . Cancer ([place])    Basal cell cancer; followed annually by dermatology/[place].  . Glucose intolerance (impaired glucose tolerance)   . Gout 08/02/2012  . Herpes labialis   . Squamous cell carcinoma of skin 2017   right arm, removed by Dr. Martinique  . Trigeminal neuralgia of left side of face    since childhood. controlled by tegretol (has had it on both sides in the past)    Past Surgical History:  Procedure Laterality Date  . COLONOSCOPY  11/01/2010   normal; repeat in 10 years.  [facility].    Current Medications: Outpatient Medications Prior to Visit  Medication Sig Dispense Refill  . allopurinol (ZYLOPRIM) 300 MG tablet Take 1 tablet (300 mg total) by mouth daily. 90 tablet 3  . aspirin 81 MG tablet Take 81 mg by mouth daily.    Marland Kitchen atorvastatin (LIPITOR) 40 MG tablet Take 1 tablet (40 mg total) by  mouth daily. 90 tablet 3  . carbamazepine (TEGRETOL XR) 100 MG 12 hr tablet TAKE 1 TABLET BY MOUTH EVERY DAY 30 tablet 3  . carbamazepine (TEGRETOL) 100 MG chewable tablet Use as directed prn for trigeminal neuralgia flare 30 tablet 1  . cholecalciferol (VITAMIN D) 1000 units tablet Take 1,000 Units by mouth daily.    Marland Kitchen CINNAMON PO Take by mouth daily.    . colchicine 0.6 MG tablet Take 2 tablets by mouth at onset of gout flare.  May repeat 1 tablet in 1 hour if needed (max 3 tabs/24 hr) 20 tablet 0  . diclofenac (VOLTAREN) 75 MG EC tablet Take 1 tablet (75 mg total) by mouth 2 (two) times daily as needed. 60 tablet 0  . diphenhydrAMINE (BENADRYL) 25 MG tablet Take 25 mg by mouth at bedtime as needed for sleep.     . fexofenadine (ALLEGRA) 180 MG tablet Take 180 mg by mouth daily.    . fish oil-omega-3 fatty acids 1000 MG capsule Take 2 g by mouth 2 (two) times daily.     Marland Kitchen glucosamine-chondroitin 500-400 MG tablet Take 2 tablets by mouth 3 (three) times daily.    Marland Kitchen losartan (COZAAR) 25 MG tablet TAKE 1 TABLET (25 MG TOTAL) BY MOUTH DAILY. 90 tablet 0  . Melatonin 3 MG TABS Take 3 mg by mouth as needed.    Marland Kitchen MILK THISTLE PO Take by mouth daily.     Marland Kitchen  Multiple Vitamin (MULTIVITAMIN) tablet Take 1 tablet by mouth daily.    . naproxen sodium (ANAPROX) 220 MG tablet Take 220 mg by mouth 2 (two) times daily with a meal.    . omega-3 acid ethyl esters (LOVAZA) 1 g capsule Take 2 capsules (2 g total) by mouth 2 (two) times daily. 120 capsule 5  . valACYclovir (VALTREX) 1000 MG tablet Take 2 tablets (2,000 mg total) by mouth 2 (two) times daily. 120 tablet 6   No facility-administered medications prior to visit.      Allergies:   Minocycline and Penicillins   Social History   Socioeconomic History  . Marital status: Married    Spouse name: None  . Number of children: None  . Years of education: None  . Highest education level: None  Social Needs  . Financial resource strain: None  . Food  insecurity - worry: None  . Food insecurity - inability: None  . Transportation needs - medical: None  . Transportation needs - non-medical: None  Occupational History  . None  Tobacco Use  . Smoking status: Never Smoker  . Smokeless tobacco: Never Used  Substance and Sexual Activity  . Alcohol use: Yes    Alcohol/week: 7.0 oz    Types: 14 Standard drinks or equivalent per week    Comment: 2-3 glasses of wine or beer/d  . Drug use: No  . Sexual activity: Yes    Partners: Female  Other Topics Concern  . 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   Wife also retired spring 2018      Tobacco: none      Alcohol:  2-3 glasses of wine daily      Drugs: none      Exercising: walking 30-60 minutes daily. No regular weight-bearing exercise      Seatbelt: 100%      Guns:  Loaded partially secured; no children in house.      Sunscreen:  SPF 30-50.     Family History:  The patient's family history includes Arthritis in his father; Cancer in his brother and father; Colon polyps in his father; Diabetes in his brother and father; Heart disease in his father and maternal grandfather; Hyperlipidemia in his brother and father; Hypertension in his brother; Kidney Stones in his daughter; Parkinson's disease in his father; Pulmonary embolism in his mother.   ROS:   Please see the history of present illness.    None  All other systems reviewed and are negative.   PHYSICAL EXAM:   VS:  BP 140/74   Pulse 73   Ht 5\' 9"  (8.469 m)   Wt 199 lb 12.8 oz (90.6 kg)   SpO2 96%   BMI 29.51 kg/m    GEN: Well nourished, well developed, in no acute distress  HEENT: normal  Neck: no JVD, carotid bruits, or masses Cardiac: RRR; no murmurs, rubs, or gallops,no edema    Respiratory:  clear to auscultation bilaterally, normal work of breathing GI: soft, nontender, nondistended, + BS MS: no deformity or atrophy  Skin: warm and dry, no rash Neuro:  Alert and Oriented x 3, Strength and sensation are intact Psych: euthymic mood, full affect  Wt Readings from Last 3 Encounters:  09/13/17 199 lb 12.8 oz (90.6 kg)  09/08/17 197 lb (89.4 kg)  01/06/17 197 lb 12.8 oz (89.7 kg)      Studies/Labs Reviewed:   EKG:  EKG performed by Dr. Tomi Bamberger on 09/08/17 demonstrating mild left axis deviation, poor R wave progression V1 through V5.  Decreased R wave progression is new compared with 2015.  Recent Labs: 09/08/2017: ALT 36; BUN 16; Creatinine, Ser 0.80; Hemoglobin 13.8; Platelets 155; Potassium 4.3; Sodium 140; TSH 0.628   Lipid Panel    Component Value Date/Time   CHOL 128 09/08/2017 1000   TRIG 101 09/08/2017 1000   HDL 52 09/08/2017 1000   CHOLHDL 2.5 09/08/2017 1000   CHOLHDL 2.9 01/05/2017 0757   VLDL 39 (H) 01/05/2017 0757   LDLCALC 56 09/08/2017 1000   LDLDIRECT 126.5 02/18/2012 0819    Additional studies/ records that were reviewed today include:  No recent cardiac evaluation.    ASSESSMENT:    1. Abnormal ECG   2. Mixed hyperlipidemia due to type 2 diabetes mellitus ([place])   3. Type 2 diabetes mellitus with microalbuminuria, without long-term current use of insulin ([place])   4. Pure hypercholesterolemia   5. Elevated BP without diagnosis of hypertension   6. Abnormal electrocardiogram      PLAN:  In order of problems listed above:  1. ECG has poor R wave progression raising concern for anterolateral infarction.  Plan to perform stress nuclear to exclude anterior lateral scar.  Assess LV function.  Patient is asymptomatic.  EKG appearance could be related to lead position. 2. LDL target less than 70. 3. Hemoglobin A1c less than 70. 4. Blood pressure target 130/85 mmHg or less.  Follow-up will be dependent upon stress myocardial perfusion  study.  If no evidence of scar or ischemia, would advise primary risk prevention with LDL less than 70, blood pressure 130/85 mmHg or less, and A1c less than 7.  I have encouraged aerobic exercise.  Medication Adjustments/Labs and Tests Ordered: Current medicines are reviewed at length with the patient today.  Concerns regarding medicines are outlined above.  Medication changes, Labs and Tests ordered today are listed in the Patient Instructions below. Patient Instructions  Medication Instructions:  Your physician recommends that you continue on your current medications as directed. Please refer to the Current Medication list given to you today.  Labwork: None  Testing/Procedures: Your physician has requested that you have en exercise stress myoview. For further information please visit [email or web address]. Please follow instruction sheet, as given.   Follow-Up: Your physician recommends that you schedule a follow-up appointment as needed with Dr. Tamala Julian.     Any Other Special Instructions Will Be Listed Below (If Applicable).     If you need a refill on your cardiac medications before your next appointment, please call your pharmacy.      Signed, Sinclair Grooms, MD  09/13/2017 3:32 PM    [facility] [place], [place], [place]  [address] Phone: [phone]; Fax: [phone]

## 2017-09-13 ENCOUNTER — Encounter: Payer: Self-pay | Admitting: Interventional Cardiology

## 2017-09-13 ENCOUNTER — Other Ambulatory Visit: Payer: Self-pay | Admitting: Family Medicine

## 2017-09-13 ENCOUNTER — Ambulatory Visit (INDEPENDENT_AMBULATORY_CARE_PROVIDER_SITE_OTHER): Payer: Medicare Other | Admitting: Interventional Cardiology

## 2017-09-13 ENCOUNTER — Encounter: Payer: Self-pay | Admitting: Family Medicine

## 2017-09-13 VITALS — BP 140/74 | HR 73 | Ht 69.0 in | Wt 199.8 lb

## 2017-09-13 DIAGNOSIS — E1129 Type 2 diabetes mellitus with other diabetic kidney complication: Secondary | ICD-10-CM | POA: Diagnosis not present

## 2017-09-13 DIAGNOSIS — R030 Elevated blood-pressure reading, without diagnosis of hypertension: Secondary | ICD-10-CM | POA: Diagnosis not present

## 2017-09-13 DIAGNOSIS — R809 Proteinuria, unspecified: Secondary | ICD-10-CM

## 2017-09-13 DIAGNOSIS — E7800 Pure hypercholesterolemia, unspecified: Secondary | ICD-10-CM

## 2017-09-13 DIAGNOSIS — E782 Mixed hyperlipidemia: Secondary | ICD-10-CM | POA: Diagnosis not present

## 2017-09-13 DIAGNOSIS — R9431 Abnormal electrocardiogram [ECG] [EKG]: Secondary | ICD-10-CM | POA: Diagnosis not present

## 2017-09-13 DIAGNOSIS — E790 Hyperuricemia without signs of inflammatory arthritis and tophaceous disease: Secondary | ICD-10-CM

## 2017-09-13 DIAGNOSIS — E1169 Type 2 diabetes mellitus with other specified complication: Secondary | ICD-10-CM

## 2017-09-13 DIAGNOSIS — M109 Gout, unspecified: Secondary | ICD-10-CM

## 2017-09-13 MED ORDER — ALLOPURINOL 300 MG PO TABS: 300.0000 mg | ORAL_TABLET | Freq: Every day | ORAL | 3 refills | Status: DC

## 2017-09-13 MED ORDER — LOSARTAN POTASSIUM 25 MG PO TABS: 25.0000 mg | ORAL_TABLET | Freq: Every day | ORAL | 0 refills | Status: DC

## 2017-09-13 MED ORDER — ATORVASTATIN CALCIUM 40 MG PO TABS: 40.0000 mg | ORAL_TABLET | Freq: Every day | ORAL | 3 refills | Status: DC

## 2017-09-13 NOTE — Telephone Encounter (Signed)
Looks like this was already done on 1/30 but will refill it again incase they didn't get it

## 2017-09-13 NOTE — Patient Instructions (Signed)
Medication Instructions:  Your physician recommends that you continue on your current medications as directed. Please refer to the Current Medication list given to you today.  Labwork: None  Testing/Procedures: Your physician has requested that you have en exercise stress myoview. For further information please visit [email or web address]. Please follow instruction sheet, as given.   Follow-Up: Your physician recommends that you schedule a follow-up appointment as needed with Dr. Smith.    Any Other Special Instructions Will Be Listed Below (If Applicable).     If you need a refill on your cardiac medications before your next appointment, please call your pharmacy.   

## 2017-09-29 ENCOUNTER — Telehealth (HOSPITAL_COMMUNITY): Payer: Self-pay | Admitting: *Deleted

## 2017-09-29 NOTE — Telephone Encounter (Signed)
Left message on voicemail per DPR in reference to upcoming appointment scheduled on 10/04/17 with detailed instructions given per Myocardial Perfusion Study Information Sheet for the test. LM to arrive 15 minutes early, and that it is imperative to arrive on time for appointment to keep from having the test rescheduled. If you need to cancel or reschedule your appointment, please call the office within 24 hours of your appointment. Failure to do so may result in a cancellation of your appointment, and a $50 no show fee. Phone number given for call back for any questions. Cederic Mozley Jacqueline     

## 2017-10-04 ENCOUNTER — Ambulatory Visit (HOSPITAL_COMMUNITY): Payer: Medicare Other | Attending: Cardiology

## 2017-10-04 DIAGNOSIS — R9431 Abnormal electrocardiogram [ECG] [EKG]: Secondary | ICD-10-CM | POA: Diagnosis not present

## 2017-10-04 DIAGNOSIS — I10 Essential (primary) hypertension: Secondary | ICD-10-CM | POA: Insufficient documentation

## 2017-10-04 DIAGNOSIS — E785 Hyperlipidemia, unspecified: Secondary | ICD-10-CM | POA: Diagnosis not present

## 2017-10-04 MED ORDER — TECHNETIUM TC 99M TETROFOSMIN IV KIT
10.7000 | PACK | Freq: Once | INTRAVENOUS | Status: AC | PRN
  Administered 2017-10-04: 10.7 via INTRAVENOUS
  Filled 2017-10-04: qty 11

## 2017-10-04 MED ORDER — TECHNETIUM TC 99M TETROFOSMIN IV KIT
31.7000 | PACK | Freq: Once | INTRAVENOUS | Status: AC | PRN
  Administered 2017-10-04: 31.7 via INTRAVENOUS
  Filled 2017-10-04: qty 32

## 2017-10-05 LAB — MYOCARDIAL PERFUSION IMAGING
CHL CUP MPHR: 155 {beats}/min
CHL CUP NUCLEAR SDS: 2
CHL CUP NUCLEAR SRS: 6
CHL CUP RESTING HR STRESS: 63 {beats}/min
CSEPPHR: 155 {beats}/min
Estimated workload: 13.4 METS
Exercise duration (min): 11 min
Exercise duration (sec): 30 s
LHR: 0.38
LV sys vol: 68 mL
LVDIAVOL: 133 mL (ref 62–150)
Percent HR: 100 %
SSS: 8
TID: 0.92

## 2017-10-06 ENCOUNTER — Encounter: Payer: Self-pay | Admitting: Interventional Cardiology

## 2017-10-10 ENCOUNTER — Other Ambulatory Visit: Payer: Self-pay | Admitting: *Deleted

## 2017-10-10 DIAGNOSIS — I493 Ventricular premature depolarization: Secondary | ICD-10-CM

## 2017-10-10 DIAGNOSIS — R030 Elevated blood-pressure reading, without diagnosis of hypertension: Secondary | ICD-10-CM

## 2017-10-21 ENCOUNTER — Ambulatory Visit (INDEPENDENT_AMBULATORY_CARE_PROVIDER_SITE_OTHER): Payer: Medicare Other

## 2017-10-21 ENCOUNTER — Other Ambulatory Visit: Payer: Self-pay

## 2017-10-21 ENCOUNTER — Ambulatory Visit (HOSPITAL_COMMUNITY): Payer: Medicare Other | Attending: Cardiovascular Disease

## 2017-10-21 DIAGNOSIS — R030 Elevated blood-pressure reading, without diagnosis of hypertension: Secondary | ICD-10-CM | POA: Diagnosis not present

## 2017-10-21 DIAGNOSIS — I493 Ventricular premature depolarization: Secondary | ICD-10-CM

## 2017-10-21 DIAGNOSIS — I351 Nonrheumatic aortic (valve) insufficiency: Secondary | ICD-10-CM | POA: Insufficient documentation

## 2017-10-21 DIAGNOSIS — E785 Hyperlipidemia, unspecified: Secondary | ICD-10-CM | POA: Diagnosis not present

## 2017-10-21 DIAGNOSIS — I5189 Other ill-defined heart diseases: Secondary | ICD-10-CM | POA: Insufficient documentation

## 2017-10-21 DIAGNOSIS — E119 Type 2 diabetes mellitus without complications: Secondary | ICD-10-CM | POA: Insufficient documentation

## 2017-11-01 ENCOUNTER — Encounter: Payer: Self-pay | Admitting: Interventional Cardiology

## 2017-12-27 ENCOUNTER — Encounter: Payer: Self-pay | Admitting: Family Medicine

## 2018-01-05 DIAGNOSIS — L821 Other seborrheic keratosis: Secondary | ICD-10-CM | POA: Diagnosis not present

## 2018-01-05 DIAGNOSIS — L570 Actinic keratosis: Secondary | ICD-10-CM | POA: Diagnosis not present

## 2018-01-05 DIAGNOSIS — D225 Melanocytic nevi of trunk: Secondary | ICD-10-CM | POA: Diagnosis not present

## 2018-01-05 DIAGNOSIS — D1801 Hemangioma of skin and subcutaneous tissue: Secondary | ICD-10-CM | POA: Diagnosis not present

## 2018-01-05 DIAGNOSIS — Z85828 Personal history of other malignant neoplasm of skin: Secondary | ICD-10-CM | POA: Diagnosis not present

## 2018-01-05 DIAGNOSIS — L819 Disorder of pigmentation, unspecified: Secondary | ICD-10-CM | POA: Diagnosis not present

## 2018-02-15 ENCOUNTER — Encounter: Payer: Self-pay | Admitting: Family Medicine

## 2018-03-05 ENCOUNTER — Other Ambulatory Visit: Payer: Self-pay | Admitting: Family Medicine

## 2018-03-05 DIAGNOSIS — R809 Proteinuria, unspecified: Principal | ICD-10-CM

## 2018-03-05 DIAGNOSIS — E1129 Type 2 diabetes mellitus with other diabetic kidney complication: Secondary | ICD-10-CM

## 2018-03-06 NOTE — Telephone Encounter (Signed)
Called pt as well to find out status of med

## 2018-03-07 NOTE — Progress Notes (Signed)
Chief Complaint  Patient presents with  . med check    med check    Penile swelling while on vacation in [place], in the middle of July.  Lasted one day. He woke up and noted the underside of his penis, behind the head was extremely swollen. "It was like a bulb underneath just behind the head. No real discoloration. No pain either and no problem urinating".  It was significant improved by later that evening. This was different than the prior episode, discussed at last visit, which was more of a purple discoloration/bruise, lasted a week, very different than this past episode.  Diabetes: diet controlled; doesn't check blood sugars. Denies polydipsia, polyuria. UTD on diabetic eye exam, had in 06/2017. Checks his feet regularly without concerns. Traveling/vacations every month (plus beach and [place]) since February. 2-3 alcoholic beverages daily on average, often more when on vacation.  Limits potatoes, pasta when home. Lab Results  Component Value Date   HGBA1C 6.6 09/08/2017   Hyperlipidemia: Following a lowfat, low cholesterol diet. Eats healthy overall. Travels frequently, sometimes isn't as good in the restaurants. Tolerating statin without side effect. Changed from Vascepa to Lovaza due to cost.   Lab Results  Component Value Date   CHOL 128 09/08/2017   HDL 52 09/08/2017   LDLCALC 56 09/08/2017   LDLDIRECT 126.5 02/18/2012   TRIG 101 09/08/2017   CHOLHDL 2.5 09/08/2017   Hypertension follow-up: Blood pressures elsewhere are running 117-150/70-90. Lowest blood pressures after swimming. Tolerating losartan without side effects.Denies dizziness, headaches, chest pain.   He was referred to cardiology after his physical for abnormal EKG.  He denies any palpitations or symptoms. Evaluations in March 2019: Stress test showed: Normal exercise nuclear stress test with no evidence for prior infarct and ischemia.  Hypertensive response to exercise.  Excellent exercise capacity.   LVEF calculated at 49% but visually appears better,  Correlation with an echocardiogram is recommended.  Holter monitor showed: NSR PAC's and PVC's with brief asymptomatic runs including accelerated idioventricular rhythm. PVC and PAC burden each < 4%.  Echo showed: - Left ventricle: The cavity size was normal. Wall thickness was   normal. Systolic function was normal. The estimated ejection   fraction was in the range of 50% to 55%. Doppler parameters are   consistent with abnormal left ventricular relaxation (grade 1   diastolic dysfunction). - Aortic valve: There was mild to moderate regurgitation directed   centrally in the LVOT. - Left atrium: The atrium was mildly dilated.  Gout--taking allopurinol.  Flared last in January, when he started glucosamine/chondroitin (R knee, R elbow, relieved by diclofenac).  Doing better on the lower dose of the supplement.   Previous flares have been in toes, ankle.  No flares since last visit.  Trigeminal neuralgia since childhood. Sometimes on the right, other times on the left. Saw neuro in the past and put on tegretol. Weaned to every other day, which still controls it. If he develops symptoms, he increases the dose back up to daily and also uses short-acting.Last flare was a couple of months ago, overall very rare.  He didn't have the short acting medicine with him, but took the XR medication daily along with Aleve and this resolved his symptoms of shooting pain behind the ear.  Vitamin D deficiency--s/p 12 weeks of prescription therapy in the past.Last check was a level of 28 in February. At that time he was taking D3 daily (unsure of dose, and doses may be different depending on  where he was, different bottles in different homes) and a MVI about 4x/week.  Prior to that had been 32 in June 2018, when taking Vit D and MVI daily.  Currently still taking MVI 4x/week and mostly 1000 IU daily.  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  PMH, PSH, SH reviewed  Outpatient Encounter Medications as of 03/08/2018  Medication Sig Note  . allopurinol (ZYLOPRIM) 300 MG tablet Take 1 tablet (300 mg total) by mouth daily.   . aspirin 81 MG tablet Take 81 mg by mouth daily. 01/06/2017: Occasional holds if he is having bleeding "springing leaks"  . atorvastatin (LIPITOR) 40 MG tablet Take 1 tablet (40 mg total) by mouth daily.   . carbamazepine (TEGRETOL XR) 100 MG 12 hr tablet TAKE 1 TABLET BY MOUTH EVERY DAY 09/08/2017: Takes every other day (changes to qd only if flare of trigeminal neuralgia)  . cholecalciferol (VITAMIN D) 1000 units tablet Take 1,000 Units by mouth daily. 03/08/2018: 1000 IU (one bottle he has is 5000)  . CINNAMON PO Take by mouth daily.   . diclofenac (VOLTAREN) 75 MG EC tablet Take 1 tablet (75 mg total) by mouth 2 (two) times daily as needed. 01/06/2017: Uses prn gout pain (and/or other joint pains)  . diphenhydrAMINE (BENADRYL) 25 MG tablet Take 25 mg by mouth at bedtime as needed for sleep.    . fexofenadine (ALLEGRA) 180 MG tablet Take 180 mg by mouth daily. 09/08/2017: Used for hives, resolved; uses prn allergies (rare)  . fish oil-omega-3 fatty acids 1000 MG capsule Take 2 g by mouth 2 (two) times daily.  01/06/2017: Takes 2 every evening  . glucosamine-chondroitin 500-400 MG tablet Take 2 tablets by mouth 3 (three) times daily. 09/08/2017: Takes 1-2 pills/day  . losartan (COZAAR) 25 MG tablet Take 1 tablet (25 mg total) by mouth daily.   . Melatonin 3 MG TABS Take 3 mg by mouth as needed. 01/06/2017: Takes occasionally  . MILK THISTLE PO Take by mouth daily.  09/08/2017: occasionally  . Multiple Vitamin (MULTIVITAMIN) tablet Take 1 tablet by mouth daily. 09/08/2017: Remembers about 4x/week  . omega-3 acid ethyl esters (LOVAZA) 1 g capsule Take 2 capsules (2 g total) by mouth 2 (two) times daily.   . [DISCONTINUED] omega-3 acid ethyl esters (LOVAZA) 1 g capsule Take 2 capsules (2 g total)  by mouth 2 (two) times daily.   . carbamazepine (TEGRETOL) 100 MG chewable tablet Use as directed prn for trigeminal neuralgia flare (Patient not taking: Reported on 03/08/2018)   . colchicine 0.6 MG tablet Take 2 tablets by mouth at onset of gout flare.  May repeat 1 tablet in 1 hour if needed (max 3 tabs/24 hr) (Patient not taking: Reported on 03/08/2018)   . metFORMIN (GLUCOPHAGE XR) 500 MG 24 hr tablet Take 1 tablet (500 mg total) by mouth daily with breakfast.   . naproxen sodium (ANAPROX) 220 MG tablet Take 220 mg by mouth 2 (two) times daily with a meal. 03/24/2016: Uses Aleve prn pain, headache  . valACYclovir (VALTREX) 1000 MG tablet Take 2 tablets at onset of cold sore and repeat once in 12 hours (4 tablets/course)   . [DISCONTINUED] valACYclovir (VALTREX) 1000 MG tablet Take 2 tablets (2,000 mg total) by mouth 2 (two) times daily. (Patient not taking: Reported on 03/08/2018) 11/13/2014: PRN   No facility-administered encounter medications on file as of 03/08/2018.    (not taking metformin prior to today)  Allergies  Allergen Reactions  .   Minocycline Anaphylaxis  . Penicillins Hives    ROS: Denies fever, chills, URI symptoms, edema. Left achilles and posterior heel pain. Uses diclofenac prn, which helps some. Tremor sporadically, mild.  Less often than at last visit. Very infrequent. Denies palpitations, shortness of breath, chest pain. No recent gout flare; pain in left achilles as per HPI. Some ankle stiffness after long car rides. No headaches (except last TGN flare)   PHYSICAL EXAM:  BP 120/76   Pulse 66   Temp 98.5 F (36.9 C) (Oral)   Resp 16   Ht 5' 9" (1.753 m)   Wt 198 lb (89.8 kg)   SpO2 97%   BMI 29.24 kg/m   Wt Readings from Last 3 Encounters:  03/08/18 198 lb (89.8 kg)  10/04/17 199 lb (90.3 kg)  09/13/17 199 lb 12.8 oz (90.6 kg)   Well appearing, pleasant male in no distress HEENT: Sclera anicteric; conjunctiva is mildly injected bilaterally (seem a little  dry; no crusting, drainage, swelling).  EOMI, OP clear, sinuses nontender. Neck: No lymphadenopathy, thyromegaly or carotid bruit Heart: regular rate and rhythm, trace murmur at RUSB Lungs: clear bilaterally Back: no spinal or CVA tenderness Abdomen: soft, nontender, no organomegaly or mass Extremities: no edema, normal pulses Psych: normal mood, affect, hygiene and grooming Neuro: alert and oriented, cranial nerves intact, normal gait  Lab Results  Component Value Date   HGBA1C 7.1 (A) 03/08/2018    ASSESSMENT/PLAN:   Type 2 diabetes mellitus with microalbuminuria, without long-term current use of insulin ([place]) - worsened control--significant vacation, alcohol; gets regular exercise, no wt change. Rec metformin. Declined monitoring sugars - Plan: HgB A1c, metFORMIN (GLUCOPHAGE XR) 500 MG 24 hr tablet  Essential hypertension - some fluctuations, but overall controlled.  Mixed hyperlipidemia due to type 2 diabetes mellitus ([place]) - at goal per last check; cont statin and lovaza - Plan: omega-3 acid ethyl esters (LOVAZA) 1 g capsule  Vitamin D deficiency - cont daily supplement. encouraged daily MVI  Herpes labialis - Plan: valACYclovir (VALTREX) 1000 MG tablet  Episode of penile swelling--lasted <1d, self-resolved. ?allergic/contact reaction?  F/u if recurs.    F/u visit in 3 mos (after starting metformin)--A1c at visit  Set up AWV/med check for 09/2018 Labs prior: A1c, CBC, c-met, lipids, TSH, urine microalbumin, uric acid, PSA   Declines glucometer  Start metformin XR 561m.  Take it once daily with breakfast. Try and cut back on alcohol (limit to 1 daily, if possible, and no more than 2). If you develop diarrhea from the metformin, try taking metamucil along with it.

## 2018-03-08 ENCOUNTER — Encounter: Payer: Self-pay | Admitting: Family Medicine

## 2018-03-08 ENCOUNTER — Ambulatory Visit (INDEPENDENT_AMBULATORY_CARE_PROVIDER_SITE_OTHER): Payer: Medicare Other | Admitting: Family Medicine

## 2018-03-08 VITALS — BP 120/76 | HR 66 | Temp 98.5°F | Resp 16 | Ht 69.0 in | Wt 198.0 lb

## 2018-03-08 DIAGNOSIS — B001 Herpesviral vesicular dermatitis: Secondary | ICD-10-CM

## 2018-03-08 DIAGNOSIS — I10 Essential (primary) hypertension: Secondary | ICD-10-CM

## 2018-03-08 DIAGNOSIS — R809 Proteinuria, unspecified: Secondary | ICD-10-CM | POA: Diagnosis not present

## 2018-03-08 DIAGNOSIS — Z125 Encounter for screening for malignant neoplasm of prostate: Secondary | ICD-10-CM

## 2018-03-08 DIAGNOSIS — E1129 Type 2 diabetes mellitus with other diabetic kidney complication: Secondary | ICD-10-CM

## 2018-03-08 DIAGNOSIS — E782 Mixed hyperlipidemia: Secondary | ICD-10-CM | POA: Diagnosis not present

## 2018-03-08 DIAGNOSIS — E1169 Type 2 diabetes mellitus with other specified complication: Secondary | ICD-10-CM

## 2018-03-08 DIAGNOSIS — E559 Vitamin D deficiency, unspecified: Secondary | ICD-10-CM

## 2018-03-08 DIAGNOSIS — M109 Gout, unspecified: Secondary | ICD-10-CM

## 2018-03-08 DIAGNOSIS — Z5181 Encounter for therapeutic drug level monitoring: Secondary | ICD-10-CM

## 2018-03-08 LAB — POCT GLYCOSYLATED HEMOGLOBIN (HGB A1C): Hemoglobin A1C: 7.1 % — AB (ref 4.0–5.6)

## 2018-03-08 MED ORDER — VALACYCLOVIR HCL 1 G PO TABS: ORAL_TABLET | ORAL | 3 refills | Status: DC

## 2018-03-08 MED ORDER — OMEGA-3-ACID ETHYL ESTERS 1 G PO CAPS: 2.0000 g | ORAL_CAPSULE | Freq: Two times a day (BID) | ORAL | 5 refills | Status: DC

## 2018-03-08 MED ORDER — METFORMIN HCL ER 500 MG PO TB24: 500.0000 mg | ORAL_TABLET | Freq: Every day | ORAL | 3 refills | Status: DC

## 2018-03-08 NOTE — Patient Instructions (Signed)
Start metformin XR 500mg .  Take it once daily with breakfast. Try and cut back on alcohol (limit to 1 daily, if possible, and no more than 2). If you develop diarrhea from the metformin, try taking metamucil along with it.    Diabetes Mellitus and Nutrition When you have diabetes (diabetes mellitus), it is very important to have healthy eating habits because your blood sugar (glucose) levels are greatly affected by what you eat and drink. Eating healthy foods in the appropriate amounts, at about the same times every day, can help you:  Control your blood glucose.  Lower your risk of heart disease.  Improve your blood pressure.  Reach or maintain a healthy weight.  Every person with diabetes is different, and each person has different needs for a meal plan. Your health care provider may recommend that you work with a diet and nutrition specialist (dietitian) to make a meal plan that is best for you. Your meal plan may vary depending on factors such as:  The calories you need.  The medicines you take.  Your weight.  Your blood glucose, blood pressure, and cholesterol levels.  Your activity level.  Other health conditions you have, such as heart or kidney disease.  How do carbohydrates affect me? Carbohydrates affect your blood glucose level more than any other type of food. Eating carbohydrates naturally increases the amount of glucose in your blood. Carbohydrate counting is a method for keeping track of how many carbohydrates you eat. Counting carbohydrates is important to keep your blood glucose at a healthy level, especially if you use insulin or take certain oral diabetes medicines. It is important to know how many carbohydrates you can safely have in each meal. This is different for every person. Your dietitian can help you calculate how many carbohydrates you should have at each meal and for snack. Foods that contain carbohydrates include:  Bread, cereal, rice, pasta, and  crackers.  Potatoes and corn.  Peas, beans, and lentils.  Milk and yogurt.  Fruit and juice.  Desserts, such as cakes, cookies, ice cream, and candy.  How does alcohol affect me? Alcohol can cause a sudden decrease in blood glucose (hypoglycemia), especially if you use insulin or take certain oral diabetes medicines. Hypoglycemia can be a life-threatening condition. Symptoms of hypoglycemia (sleepiness, dizziness, and confusion) are similar to symptoms of having too much alcohol. If your health care provider says that alcohol is safe for you, follow these guidelines:  Limit alcohol intake to no more than 1 drink per day for nonpregnant women and 2 drinks per day for men. One drink equals 12 oz of beer, 5 oz of wine, or 1 oz of hard liquor.  Do not drink on an empty stomach.  Keep yourself hydrated with water, diet soda, or unsweetened iced tea.  Keep in mind that regular soda, juice, and other mixers may contain a lot of sugar and must be counted as carbohydrates.  What are tips for following this plan? Reading food labels  Start by checking the serving size on the label. The amount of calories, carbohydrates, fats, and other nutrients listed on the label are based on one serving of the food. Many foods contain more than one serving per package.  Check the total grams (g) of carbohydrates in one serving. You can calculate the number of servings of carbohydrates in one serving by dividing the total carbohydrates by 15. For example, if a food has 30 g of total carbohydrates, it would be equal to  2 servings of carbohydrates.  Check the number of grams (g) of saturated and trans fats in one serving. Choose foods that have low or no amount of these fats.  Check the number of milligrams (mg) of sodium in one serving. Most people should limit total sodium intake to less than 2,300 mg per day.  Always check the nutrition information of foods labeled as "low-fat" or "nonfat". These foods  may be higher in added sugar or refined carbohydrates and should be avoided.  Talk to your dietitian to identify your daily goals for nutrients listed on the label. Shopping  Avoid buying canned, premade, or processed foods. These foods tend to be high in fat, sodium, and added sugar.  Shop around the outside edge of the grocery store. This includes fresh fruits and vegetables, bulk grains, fresh meats, and fresh dairy. Cooking  Use low-heat cooking methods, such as baking, instead of high-heat cooking methods like deep frying.  Cook using healthy oils, such as olive, canola, or sunflower oil.  Avoid cooking with butter, cream, or high-fat meats. Meal planning  Eat meals and snacks regularly, preferably at the same times every day. Avoid going long periods of time without eating.  Eat foods high in fiber, such as fresh fruits, vegetables, beans, and whole grains. Talk to your dietitian about how many servings of carbohydrates you can eat at each meal.  Eat 4-6 ounces of lean protein each day, such as lean meat, chicken, fish, eggs, or tofu. 1 ounce is equal to 1 ounce of meat, chicken, or fish, 1 egg, or 1/4 cup of tofu.  Eat some foods each day that contain healthy fats, such as avocado, nuts, seeds, and fish. Lifestyle   Check your blood glucose regularly.  Exercise at least 30 minutes 5 or more days each week, or as told by your health care provider.  Take medicines as told by your health care provider.  Do not use any products that contain nicotine or tobacco, such as cigarettes and e-cigarettes. If you need help quitting, ask your health care provider.  Work with a Social worker or diabetes educator to identify strategies to manage stress and any emotional and social challenges. What are some questions to ask my health care provider?  Do I need to meet with a diabetes educator?  Do I need to meet with a dietitian?  What number can I call if I have questions?  When are the  best times to check my blood glucose? Where to find more information:  American Diabetes Association: diabetes.org/food-and-fitness/food  Academy of Nutrition and Dietetics: PokerClues.dk  [employer] of Diabetes and Digestive and Kidney Diseases (NIH): ContactWire.be Summary  A healthy meal plan will help you control your blood glucose and maintain a healthy lifestyle.  Working with a diet and nutrition specialist (dietitian) can help you make a meal plan that is best for you.  Keep in mind that carbohydrates and alcohol have immediate effects on your blood glucose levels. It is important to count carbohydrates and to use alcohol carefully. This information is not intended to replace advice given to you by your health care provider. Make sure you discuss any questions you have with your health care provider. Document Released: 04/15/2005 Document Revised: 08/23/2016 Document Reviewed: 08/23/2016 Elsevier Interactive Patient Education  [employer].

## 2018-03-10 ENCOUNTER — Other Ambulatory Visit: Payer: Self-pay | Admitting: Family Medicine

## 2018-03-10 ENCOUNTER — Other Ambulatory Visit: Payer: Self-pay

## 2018-03-10 DIAGNOSIS — R809 Proteinuria, unspecified: Principal | ICD-10-CM

## 2018-03-10 DIAGNOSIS — E1129 Type 2 diabetes mellitus with other diabetic kidney complication: Secondary | ICD-10-CM

## 2018-03-10 MED ORDER — GLUCOSE BLOOD VI STRP: ORAL_STRIP | 12 refills | Status: DC

## 2018-03-10 MED ORDER — ONETOUCH ULTRASOFT LANCETS MISC: 12 refills | Status: DC

## 2018-03-10 MED ORDER — ONETOUCH ULTRA 2 W/DEVICE KIT: 1.0000 | PACK | Freq: Two times a day (BID) | 0 refills | Status: DC

## 2018-03-10 MED ORDER — LOSARTAN POTASSIUM 25 MG PO TABS: 25.0000 mg | ORAL_TABLET | Freq: Every day | ORAL | 1 refills | Status: DC

## 2018-03-28 ENCOUNTER — Encounter: Payer: Self-pay | Admitting: Family Medicine

## 2018-04-17 ENCOUNTER — Encounter: Payer: Self-pay | Admitting: Family Medicine

## 2018-04-21 ENCOUNTER — Encounter: Payer: Self-pay | Admitting: Family Medicine

## 2018-05-02 ENCOUNTER — Other Ambulatory Visit (INDEPENDENT_AMBULATORY_CARE_PROVIDER_SITE_OTHER): Payer: Medicare Other

## 2018-05-02 DIAGNOSIS — Z23 Encounter for immunization: Secondary | ICD-10-CM | POA: Diagnosis not present

## 2018-05-09 ENCOUNTER — Encounter: Payer: Self-pay | Admitting: Family Medicine

## 2018-05-16 ENCOUNTER — Encounter: Payer: Self-pay | Admitting: Family Medicine

## 2018-05-24 ENCOUNTER — Ambulatory Visit (INDEPENDENT_AMBULATORY_CARE_PROVIDER_SITE_OTHER): Payer: Medicare Other | Admitting: Family Medicine

## 2018-05-24 DIAGNOSIS — M7662 Achilles tendinitis, left leg: Secondary | ICD-10-CM

## 2018-05-24 NOTE — Patient Instructions (Signed)
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 Calf raises 3 sets of 10 on level ground once a day first. When these are easy, can do them one legged 3 sets of 10. Finally advance to doing them on a step. Can add heel walks, toe walks forward and backward as well Icing 15 minutes at a time 3-4 times a day. Avoid uneven ground, hills as much as possible. Heel lifts in shoes or shoes with a natural heel lift. Consider physical therapy, orthotics, nitro patches if not improving as expected. Follow up in 6 weeks.

## 2018-05-26 ENCOUNTER — Encounter: Payer: Self-pay | Admitting: Family Medicine

## 2018-05-26 ENCOUNTER — Other Ambulatory Visit: Payer: Self-pay | Admitting: Family Medicine

## 2018-05-26 NOTE — Progress Notes (Signed)
PCP: Rita Ohara, MD  Subjective:   HPI: Patient is a 67 y.o. male here for left heel pain.  Patient reports he's had several weeks of posterior left heel pain. Pain was sporadic but has become more constant and sharp. Has tried diclofenac with mild benefit. Not tried any exercises for this or heel lifts. No skin changes, numbness.  Past Medical History:  Diagnosis Date  . Cancer ([place])    Basal cell cancer; followed annually by dermatology/[facility].  . Glucose intolerance (impaired glucose tolerance)   . Gout 08/02/2012  . Herpes labialis   . Squamous cell carcinoma of skin 2017   right arm, removed by Dr. Martinique  . Trigeminal neuralgia of left side of face    since childhood. controlled by tegretol (has had it on both sides in the past)    Current Outpatient Medications on File Prior to Visit  Medication Sig Dispense Refill  . allopurinol (ZYLOPRIM) 300 MG tablet Take 1 tablet (300 mg total) by mouth daily. 90 tablet 3  . aspirin 81 MG tablet Take 81 mg by mouth daily.    Marland Kitchen atorvastatin (LIPITOR) 40 MG tablet Take 1 tablet (40 mg total) by mouth daily. 90 tablet 3  . Blood Glucose Monitoring Suppl (ONE TOUCH ULTRA 2) w/Device KIT 1 kit by Does not apply route 2 (two) times daily. 1 each 0  . carbamazepine (TEGRETOL XR) 100 MG 12 hr tablet TAKE 1 TABLET BY MOUTH EVERY DAY 30 tablet 3  . cholecalciferol (VITAMIN D) 1000 units tablet Take 1,000 Units by mouth daily.    Marland Kitchen CINNAMON PO Take by mouth daily.    . colchicine 0.6 MG tablet Take 2 tablets by mouth at onset of gout flare.  May repeat 1 tablet in 1 hour if needed (max 3 tabs/24 hr) 20 tablet 0  . diclofenac (VOLTAREN) 75 MG EC tablet Take 1 tablet (75 mg total) by mouth 2 (two) times daily as needed. 60 tablet 0  . diphenhydrAMINE (BENADRYL) 25 MG tablet Take 25 mg by mouth at bedtime as needed for sleep.     . fexofenadine (ALLEGRA) 180 MG tablet Take 180 mg by mouth daily.    . fish oil-omega-3 fatty acids 1000 MG capsule Take  2 g by mouth 2 (two) times daily.     Marland Kitchen glucosamine-chondroitin 500-400 MG tablet Take 2 tablets by mouth 3 (three) times daily.    Marland Kitchen glucose blood (ONE TOUCH ULTRA TEST) test strip Use as instructed 200 each 12  . Lancets (ONETOUCH ULTRASOFT) lancets Use as instructed 200 each 12  . losartan (COZAAR) 25 MG tablet Take 1 tablet (25 mg total) by mouth daily. 90 tablet 1  . Melatonin 3 MG TABS Take 3 mg by mouth as needed.    . metFORMIN (GLUCOPHAGE XR) 500 MG 24 hr tablet Take 1 tablet (500 mg total) by mouth daily with breakfast. 30 tablet 3  . MILK THISTLE PO Take by mouth daily.     . Multiple Vitamin (MULTIVITAMIN) tablet Take 1 tablet by mouth daily.    . naproxen sodium (ANAPROX) 220 MG tablet Take 220 mg by mouth 2 (two) times daily with a meal.    . omega-3 acid ethyl esters (LOVAZA) 1 g capsule Take 2 capsules (2 g total) by mouth 2 (two) times daily. 120 capsule 5  . valACYclovir (VALTREX) 1000 MG tablet Take 2 tablets at onset of cold sore and repeat once in 12 hours (4 tablets/course) 28 tablet 3  No current facility-administered medications on file prior to visit.     Past Surgical History:  Procedure Laterality Date  . COLONOSCOPY  11/01/2010   normal; repeat in 10 years.  [facility].    Allergies  Allergen Reactions  . Minocycline Anaphylaxis  . Penicillins Hives    Social History   Socioeconomic History  . Marital status: Married    Spouse name: Not on file  . Number of children: Not on file  . Years of education: Not on file  . Highest education level: Not on file  Occupational History  . Not on file  Social Needs  . Financial resource strain: Not on file  . Food insecurity:    Worry: Not on file    Inability: Not on file  . Transportation needs:    Medical: Not on file    Non-medical: Not on file  Tobacco Use  . Smoking status: Never Smoker  . Smokeless tobacco: Never Used  Substance and Sexual Activity  . Alcohol use: Yes    Alcohol/week: 14.0  standard drinks    Types: 14 Standard drinks or equivalent per week    Comment: 2-3 glasses of wine or beer/d  . Drug use: No  . Sexual activity: Yes    Partners: Female  Lifestyle  . Physical activity:    Days per week: Not on file    Minutes per session: Not on file  . Stress: Not on file  Relationships  . Social connections:    Talks on phone: Not on file    Gets together: Not on file    Attends religious service: Not on file    Active member of club or organization: Not on file    Attends meetings of clubs or organizations: Not on file    Relationship status: Not on file  . Intimate partner violence:    Fear of current or ex partner: Not on file    Emotionally abused: Not on file    Physically abused: Not on file    Forced sexual activity: Not on file  Other Topics Concern  . 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   Wife also retired spring 2018      Tobacco: none      Alcohol:  2-3 glasses of wine daily      Drugs: none      Exercising: walking 30-60 minutes daily. No regular weight-bearing exercise      Seatbelt: 100%      Guns:  Loaded partially secured; no children in house.      Sunscreen:  SPF 30-50.    Family History  Problem Relation Age of Onset  . Colon polyps Father   . Diabetes Father   . Arthritis Father   . Heart disease Father        valve replacement.  . Hyperlipidemia Father   . Cancer Father        skin  . Parkinson's disease Father   . Pulmonary embolism Mother   . Heart disease Maternal Grandfather   . Diabetes Brother   . Hypertension Brother   . Hyperlipidemia Brother   . Cancer Brother        skin  .  Kidney Stones Daughter     BP 138/86   Ht 5' 8.5" (1.74 m)   Wt 190 lb (86.2 kg)   BMI 28.47  kg/m   Review of Systems: See HPI above.     Objective:  Physical Exam:  Gen: NAD, comfortable in exam room  Left foot/ankle: Very small haglund deformity.  No other gross deformity, swelling, ecchymoses FROM with 5/5 strength, mild pain on single calf raise TTP at insertion of achilles on calcaneus but mild. Negative ant drawer and talar tilt.   Negative syndesmotic compression. Negative calcaneal squeeze. Thompsons test negative. NV intact distally.  Right foot/ankle: No deformity. FROM with 5/5 strength. No tenderness to palpation. NVI distally.   Assessment & Plan:  1. Left achilles tendinopathy - shown home exercises to do daily and how to advance these.  Icing, diclofenac twice a day.  Heel lifts.  F/u in 6 weeks.  Consider PT, orthotics, nitro patches if not improving.

## 2018-05-29 NOTE — Telephone Encounter (Signed)
Is this okay to refill? 

## 2018-06-21 ENCOUNTER — Encounter: Payer: Medicare Other | Admitting: Family Medicine

## 2018-07-05 ENCOUNTER — Ambulatory Visit (INDEPENDENT_AMBULATORY_CARE_PROVIDER_SITE_OTHER): Payer: Medicare Other | Admitting: Family Medicine

## 2018-07-05 VITALS — BP 124/80 | Ht 68.5 in | Wt 190.0 lb

## 2018-07-05 DIAGNOSIS — M7662 Achilles tendinitis, left leg: Secondary | ICD-10-CM | POA: Diagnosis not present

## 2018-07-05 MED ORDER — NITROGLYCERIN 0.2 MG/HR TD PT24: MEDICATED_PATCH | TRANSDERMAL | 1 refills | Status: DC

## 2018-07-05 NOTE — Patient Instructions (Signed)
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 Nitro patches 1/4th patch to affected area, change daily. Calf raises 3 sets of 10 on level ground once a day first. When these are easy, can do them one legged 3 sets of 10. Finally advance to doing them on a step. Can add heel walks, toe walks forward and backward as well Icing 15 minutes at a time 3-4 times a day. Avoid uneven ground, hills as much as possible. Heel lifts in shoes or shoes with a natural heel lift. Consider physical therapy, orthotics if not improving as expected. Follow up in 6 weeks.

## 2018-07-06 ENCOUNTER — Encounter: Payer: Self-pay | Admitting: Family Medicine

## 2018-07-06 NOTE — Progress Notes (Signed)
PCP: Rita Ohara, MD  Subjective:   HPI: Patient is a 67 y.o. male here for left heel pain.  10/23: Patient reports he's had several weeks of posterior left heel pain. Pain was sporadic but has become more constant and sharp. Has tried diclofenac with mild benefit. Not tried any exercises for this or heel lifts. No skin changes, numbness.  12/4: Patient reports mild improvement since last visit. He was doing better up until he walked around [place] especially uphill and pain worsened. He is taking diclofenac once a day and doing home exercises, wearing heel lifts. No skin changes, new injuries.  Past Medical History:  Diagnosis Date  . Cancer ([place])    Basal cell cancer; followed annually by dermatology/[facility].  . Glucose intolerance (impaired glucose tolerance)   . Gout 08/02/2012  . Herpes labialis   . Squamous cell carcinoma of skin 2017   right arm, removed by Dr. Martinique  . Trigeminal neuralgia of left side of face    since childhood. controlled by tegretol (has had it on both sides in the past)    Current Outpatient Medications on File Prior to Visit  Medication Sig Dispense Refill  . allopurinol (ZYLOPRIM) 300 MG tablet Take 1 tablet (300 mg total) by mouth daily. 90 tablet 3  . aspirin 81 MG tablet Take 81 mg by mouth daily.    Marland Kitchen atorvastatin (LIPITOR) 40 MG tablet Take 1 tablet (40 mg total) by mouth daily. 90 tablet 3  . Blood Glucose Monitoring Suppl (ONE TOUCH ULTRA 2) w/Device KIT 1 kit by Does not apply route 2 (two) times daily. 1 each 0  . carbamazepine (TEGRETOL XR) 100 MG 12 hr tablet TAKE 1 TABLET BY MOUTH EVERY DAY 30 tablet 3  . carbamazepine (TEGRETOL) 100 MG chewable tablet CHEW 1 TABLET AS DIRECTED/AS NEEDED FOR TRIGEMINAL NEURALGIA FLARES. 30 tablet 0  . cholecalciferol (VITAMIN D) 1000 units tablet Take 1,000 Units by mouth daily.    Marland Kitchen CINNAMON PO Take by mouth daily.    . colchicine 0.6 MG tablet Take 2 tablets by mouth at onset of gout flare.  May  repeat 1 tablet in 1 hour if needed (max 3 tabs/24 hr) 20 tablet 0  . diclofenac (VOLTAREN) 75 MG EC tablet Take 1 tablet (75 mg total) by mouth 2 (two) times daily as needed. 60 tablet 0  . diphenhydrAMINE (BENADRYL) 25 MG tablet Take 25 mg by mouth at bedtime as needed for sleep.     . fexofenadine (ALLEGRA) 180 MG tablet Take 180 mg by mouth daily.    . fish oil-omega-3 fatty acids 1000 MG capsule Take 2 g by mouth 2 (two) times daily.     Marland Kitchen glucosamine-chondroitin 500-400 MG tablet Take 2 tablets by mouth 3 (three) times daily.    Marland Kitchen glucose blood (ONE TOUCH ULTRA TEST) test strip Use as instructed 200 each 12  . Lancets (ONETOUCH ULTRASOFT) lancets Use as instructed 200 each 12  . losartan (COZAAR) 25 MG tablet Take 1 tablet (25 mg total) by mouth daily. 90 tablet 1  . Melatonin 3 MG TABS Take 3 mg by mouth as needed.    . metFORMIN (GLUCOPHAGE XR) 500 MG 24 hr tablet Take 1 tablet (500 mg total) by mouth daily with breakfast. 30 tablet 3  . MILK THISTLE PO Take by mouth daily.     . Multiple Vitamin (MULTIVITAMIN) tablet Take 1 tablet by mouth daily.    . naproxen sodium (ANAPROX) 220 MG tablet Take  220 mg by mouth 2 (two) times daily with a meal.    . omega-3 acid ethyl esters (LOVAZA) 1 g capsule Take 2 capsules (2 g total) by mouth 2 (two) times daily. 120 capsule 5  . valACYclovir (VALTREX) 1000 MG tablet Take 2 tablets at onset of cold sore and repeat once in 12 hours (4 tablets/course) 28 tablet 3   No current facility-administered medications on file prior to visit.     Past Surgical History:  Procedure Laterality Date  . COLONOSCOPY  11/01/2010   normal; repeat in 10 years.  [facility].    Allergies  Allergen Reactions  . Minocycline Anaphylaxis  . Penicillins Hives    Social History   Socioeconomic History  . Marital status: Married    Spouse name: Not on file  . Number of children: Not on file  . Years of education: Not on file  . Highest education level: Not on  file  Occupational History  . Not on file  Social Needs  . Financial resource strain: Not on file  . Food insecurity:    Worry: Not on file    Inability: Not on file  . Transportation needs:    Medical: Not on file    Non-medical: Not on file  Tobacco Use  . Smoking status: Never Smoker  . Smokeless tobacco: Never Used  Substance and Sexual Activity  . Alcohol use: Yes    Alcohol/week: 14.0 standard drinks    Types: 14 Standard drinks or equivalent per week    Comment: 2-3 glasses of wine or beer/d  . Drug use: No  . Sexual activity: Yes    Partners: Female  Lifestyle  . Physical activity:    Days per week: Not on file    Minutes per session: Not on file  . Stress: Not on file  Relationships  . Social connections:    Talks on phone: Not on file    Gets together: Not on file    Attends religious service: Not on file    Active member of club or organization: Not on file    Attends meetings of clubs or organizations: Not on file    Relationship status: Not on file  . Intimate partner violence:    Fear of current or ex partner: Not on file    Emotionally abused: Not on file    Physically abused: Not on file    Forced sexual activity: Not on file  Other Topics Concern  . 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   Wife also retired spring 2018      Tobacco: none      Alcohol:  2-3 glasses of wine daily      Drugs: none      Exercising: walking 30-60 minutes daily. No regular weight-bearing exercise      Seatbelt: 100%      Guns:  Loaded partially secured; no children in house.      Sunscreen:  SPF 30-50.    Family History  Problem Relation Age of Onset  . Colon polyps Father   . Diabetes Father   . Arthritis Father   . Heart  disease Father  valve replacement.  . Hyperlipidemia Father   . Cancer Father        skin  . Parkinson's disease Father   . Pulmonary embolism Mother   . Heart disease Maternal Grandfather   . Diabetes Brother   . Hypertension Brother   . Hyperlipidemia Brother   . Cancer Brother        skin  . Kidney Stones Daughter     BP 124/80   Ht 5' 8.5" (1.74 m)   Wt 190 lb (86.2 kg)   BMI 28.47 kg/m   Review of Systems: See HPI above.     Objective:  Physical Exam:  Gen: NAD, comfortable in exam room  Left foot/ankle: Very small haglund deformity.  No other gross deformity, swelling, ecchymoses FROM with 5/5 strength TTP minimally at insertion of achilles on calcaneus. Negative ant drawer and talar tilt.   Negative syndesmotic compression. Negative calcaneal squeeze. Thompsons test negative. NV intact distally.   Assessment & Plan:  1. Left achilles tendinopathy - Discussed options with only mild improvement.  He will start nitro patches.  He travels between homes making physical therapy difficult - will continue home exercises.  Heel lifts. Consider custom orthotics also.  F/u in 6 weeks.

## 2018-07-17 ENCOUNTER — Encounter: Payer: Self-pay | Admitting: Family Medicine

## 2018-07-18 NOTE — Progress Notes (Signed)
Chief Complaint  Patient presents with  . Diabetes    nonfasting diabetes f/u-no concerns.    Patient presents for follow up on diabetes.  His A1c was up to 7.1 in August.  Metformin was prescribed, but never started.  He instead started checking his sugars regularly, improved diet and exercise.  He had increased exercise, lost some weight, but then was limited by pain in his left achilles (see below).  Since then, he is still exercising, but less (30 min, rather than 60). He has only had pasta once since August, limits potatoes (except at the holidays).  He is afraid that if he starts the medications, he won't eat properly (his dad did that).  He feels his quality of life with his dietary changes is fine, manageable, and prefers not to take medications if not necessary. He is not willing to cut back on his wine intake (prefers that for quality of life). He did not bring list of sugars with him today.  He recalls that his most recent sugars have been 120's-150's (152 this morning, and meal was low in carbs last night).    (last sugars he sent [place] were from 9/29 to early October, running 129 114 140 140 140 102 142 in the mornings.  Still having 2 glasses of wine most nights. Eats late. Denies heartburn.  He has been seeing Dr. Karlton Lemon for achilles tendonitis, and was recently started on nitro patches.   PMH, PSH, SH reviewed  Outpatient Encounter Medications as of 07/19/2018  Medication Sig Note  . allopurinol (ZYLOPRIM) 300 MG tablet Take 1 tablet (300 mg total) by mouth daily.   Marland Kitchen aspirin 81 MG tablet Take 81 mg by mouth daily. 01/06/2017: Occasional holds if he is having bleeding "springing leaks"  . atorvastatin (LIPITOR) 40 MG tablet Take 1 tablet (40 mg total) by mouth daily.   . Blood Glucose Monitoring Suppl (ONE TOUCH ULTRA 2) w/Device KIT 1 kit by Does not apply route 2 (two) times daily.   . carbamazepine (TEGRETOL XR) 100 MG 12 hr tablet TAKE 1 TABLET BY MOUTH EVERY DAY  09/08/2017: Takes every other day (changes to qd only if flare of trigeminal neuralgia)  . carbamazepine (TEGRETOL) 100 MG chewable tablet CHEW 1 TABLET AS DIRECTED/AS NEEDED FOR TRIGEMINAL NEURALGIA FLARES.   . cholecalciferol (VITAMIN D) 1000 units tablet Take 1,000 Units by mouth daily. 03/08/2018: 1000 IU (one bottle he has is 5000)  . CINNAMON PO Take by mouth daily.   . diclofenac (VOLTAREN) 75 MG EC tablet Take 1 tablet (75 mg total) by mouth 2 (two) times daily as needed. 01/06/2017: Uses prn gout pain (and/or other joint pains)  . fexofenadine (ALLEGRA) 180 MG tablet Take 180 mg by mouth daily. 09/08/2017: Used for hives, resolved; uses prn allergies (rare)  . fish oil-omega-3 fatty acids 1000 MG capsule Take 2 g by mouth 2 (two) times daily.  01/06/2017: Takes 2 every evening  . glucosamine-chondroitin 500-400 MG tablet Take 2 tablets by mouth 3 (three) times daily. 09/08/2017: Takes 1-2 pills/day  . glucose blood (ONE TOUCH ULTRA TEST) test strip Use as instructed   . Lancets (ONETOUCH ULTRASOFT) lancets Use as instructed   . losartan (COZAAR) 25 MG tablet Take 1 tablet (25 mg total) by mouth daily.   . Melatonin 3 MG TABS Take 3 mg by mouth as needed. 01/06/2017: Takes occasionally  . MILK THISTLE PO Take by mouth daily.  09/08/2017: occasionally  . Multiple Vitamin (MULTIVITAMIN) tablet Take  1 tablet by mouth daily. 09/08/2017: Remembers about 4x/week  . nitroGLYCERIN (NITRODUR - DOSED IN MG/24 HR) 0.2 mg/hr patch Apply 1/4th patch to affected achilles, change daily   . omega-3 acid ethyl esters (LOVAZA) 1 g capsule Take 2 capsules (2 g total) by mouth 2 (two) times daily.   . colchicine 0.6 MG tablet Take 2 tablets by mouth at onset of gout flare.  May repeat 1 tablet in 1 hour if needed (max 3 tabs/24 hr) (Patient not taking: Reported on 07/19/2018)   . naproxen sodium (ANAPROX) 220 MG tablet Take 220 mg by mouth 2 (two) times daily with a meal. 03/24/2016: Uses Aleve prn pain, headache  . valACYclovir  (VALTREX) 1000 MG tablet Take 2 tablets at onset of cold sore and repeat once in 12 hours (4 tablets/course) (Patient not taking: Reported on 07/19/2018)   . [DISCONTINUED] diphenhydrAMINE (BENADRYL) 25 MG tablet Take 25 mg by mouth at bedtime as needed for sleep.    . [DISCONTINUED] metFORMIN (GLUCOPHAGE XR) 500 MG 24 hr tablet Take 1 tablet (500 mg total) by mouth daily with breakfast. 07/19/2018: He never started this medication   No facility-administered encounter medications on file as of 07/19/2018.     Allergies  Allergen Reactions  . Minocycline Anaphylaxis  . Penicillins Hives    ROS: no fever, chills, URI symptoms, headaches, dizziness, chest pain, shortness of breath, rash.  Only joint pain is left heel.  No GI issues, or other complaints.   PHYSICAL EXAM:  BP 134/80   Pulse 60   Ht 5' 8.5" (1.74 m)   Wt 200 lb (90.7 kg)   BMI 29.97 kg/m   Wt Readings from Last 3 Encounters:  07/19/18 200 lb (90.7 kg)  07/05/18 190 lb (86.2 kg)  05/24/18 190 lb (86.2 kg)  (not weighed at the last 2 visit--with Dr. Barbaraann Barthel, 190# not accurate, that is reported home weight) Weighed 198# at visit in August.  Well appearing, pleasant, overweight male in no distress HEENT: conjunctiva and sclera are clear, EOMI Neck: no lymphadenopathy or mass Heart: regular rate and rhythm Lung: clear bilaterally Extremities: no edema Skin: normal turgor, no visible rash Psych: normal mood, affect, hygiene and grooming   Lab Results  Component Value Date   HGBA1C 6.3 (A) 07/19/2018    ASSESSMENT/PLAN:  Type 2 diabetes mellitus with microalbuminuria, without long-term current use of insulin ([place]) - improved control of diabetes with dietary changes (never started metformin). Counseled re: diet, exercise, meds, when to check sugars. Rec cutting back on wine - Plan: HgB A1c  Essential hypertension - borderline today; continue current meds   F/u as scheduled for February, with fasting labs  prior.   Continue to limit your sugar and carbs in your diet. Periodically check sugars 2-3 hours after eating. Try and limit wine to 1 glass, if possible (helps with weight, cutting out sugar and calories).

## 2018-07-19 ENCOUNTER — Encounter: Payer: Self-pay | Admitting: Family Medicine

## 2018-07-19 ENCOUNTER — Ambulatory Visit (INDEPENDENT_AMBULATORY_CARE_PROVIDER_SITE_OTHER): Payer: Medicare Other | Admitting: Family Medicine

## 2018-07-19 VITALS — BP 134/80 | HR 60 | Ht 68.5 in | Wt 200.0 lb

## 2018-07-19 DIAGNOSIS — I10 Essential (primary) hypertension: Secondary | ICD-10-CM | POA: Diagnosis not present

## 2018-07-19 DIAGNOSIS — R809 Proteinuria, unspecified: Secondary | ICD-10-CM

## 2018-07-19 DIAGNOSIS — E1129 Type 2 diabetes mellitus with other diabetic kidney complication: Secondary | ICD-10-CM | POA: Diagnosis not present

## 2018-07-19 LAB — POCT GLYCOSYLATED HEMOGLOBIN (HGB A1C): HEMOGLOBIN A1C: 6.3 % — AB (ref 4.0–5.6)

## 2018-07-19 NOTE — Patient Instructions (Signed)
  Continue to limit your sugar and carbs in your diet. Periodically check sugars 2-3 hours after eating. Try and limit wine to 1 glass, if possible (helps with weight, cutting out sugar and calories).

## 2018-07-31 DIAGNOSIS — H524 Presbyopia: Secondary | ICD-10-CM | POA: Diagnosis not present

## 2018-07-31 DIAGNOSIS — H5213 Myopia, bilateral: Secondary | ICD-10-CM | POA: Diagnosis not present

## 2018-07-31 DIAGNOSIS — E119 Type 2 diabetes mellitus without complications: Secondary | ICD-10-CM | POA: Diagnosis not present

## 2018-07-31 DIAGNOSIS — H25813 Combined forms of age-related cataract, bilateral: Secondary | ICD-10-CM | POA: Diagnosis not present

## 2018-07-31 DIAGNOSIS — H52223 Regular astigmatism, bilateral: Secondary | ICD-10-CM | POA: Diagnosis not present

## 2018-07-31 LAB — HM DIABETES EYE EXAM

## 2018-08-07 ENCOUNTER — Other Ambulatory Visit: Payer: Self-pay | Admitting: Family Medicine

## 2018-08-07 DIAGNOSIS — M109 Gout, unspecified: Secondary | ICD-10-CM

## 2018-08-07 NOTE — Telephone Encounter (Signed)
Is this okay to refill? 

## 2018-08-29 ENCOUNTER — Ambulatory Visit: Payer: Medicare Other | Admitting: Family Medicine

## 2018-08-31 ENCOUNTER — Other Ambulatory Visit: Payer: Self-pay | Admitting: Family Medicine

## 2018-08-31 ENCOUNTER — Ambulatory Visit: Payer: Medicare Other | Admitting: Family Medicine

## 2018-08-31 DIAGNOSIS — M109 Gout, unspecified: Secondary | ICD-10-CM

## 2018-08-31 NOTE — Telephone Encounter (Signed)
Called patient to see if he needed this refill since it was just done earlier this month. He said he is still taking it 1-2 times daily for his achilles, and still seeing Dr.Hudnall-seeing again in a few weeks. He has about 2 weeks of tabs left but with all the traveling he does he would like to have filled now if possible.

## 2018-09-04 ENCOUNTER — Other Ambulatory Visit: Payer: Self-pay | Admitting: Family Medicine

## 2018-09-04 DIAGNOSIS — G500 Trigeminal neuralgia: Secondary | ICD-10-CM

## 2018-09-04 NOTE — Telephone Encounter (Signed)
Is this okay to refill? 

## 2018-09-05 ENCOUNTER — Ambulatory Visit (INDEPENDENT_AMBULATORY_CARE_PROVIDER_SITE_OTHER): Payer: Medicare Other | Admitting: Family Medicine

## 2018-09-05 ENCOUNTER — Ambulatory Visit: Payer: Medicare Other | Admitting: Family Medicine

## 2018-09-05 ENCOUNTER — Other Ambulatory Visit: Payer: Self-pay | Admitting: Family Medicine

## 2018-09-05 VITALS — BP 128/78 | Ht 68.5 in | Wt 195.0 lb

## 2018-09-05 DIAGNOSIS — M7662 Achilles tendinitis, left leg: Secondary | ICD-10-CM

## 2018-09-05 DIAGNOSIS — R809 Proteinuria, unspecified: Principal | ICD-10-CM

## 2018-09-05 DIAGNOSIS — E1129 Type 2 diabetes mellitus with other diabetic kidney complication: Secondary | ICD-10-CM

## 2018-09-05 NOTE — Patient Instructions (Signed)
Continue your home exercises for another 6 weeks - don't do the single leg one though. Continue nitro patch for 6 more weeks. Stop the diclofenac. Continue heel lifts. Follow up with me in 6 weeks or as needed. Next step if you're having trouble would be to increase the nitro to 1/2 patch or to make custom orthotics.

## 2018-09-06 ENCOUNTER — Encounter: Payer: Self-pay | Admitting: Family Medicine

## 2018-09-06 NOTE — Progress Notes (Signed)
PCP: Rita Ohara, MD  Subjective:   HPI: Patient is a 68 y.o. male here for left heel pain.  10/23: Patient reports he's had several weeks of posterior left heel pain. Pain was sporadic but has become more constant and sharp. Has tried diclofenac with mild benefit. Not tried any exercises for this or heel lifts. No skin changes, numbness.  12/4: Patient reports mild improvement since last visit. He was doing better up until he walked around [place] especially uphill and pain worsened. He is taking diclofenac once a day and doing home exercises, wearing heel lifts. No skin changes, new injuries.  09/05/18: Patient reports he's improving especially the past few days. Using nitro patches, heel lifts, doing home exercises. Walking for exercise a lot bothers him. Taking diclofenac once a day. No skin changes.  Past Medical History:  Diagnosis Date  . Cancer ([place])    Basal cell cancer; followed annually by dermatology/[place].  . Glucose intolerance (impaired glucose tolerance)   . Gout 08/02/2012  . Herpes labialis   . Squamous cell carcinoma of skin 2017   right arm, removed by Dr. [place]  . Trigeminal neuralgia of left side of face    since childhood. controlled by tegretol (has had it on both sides in the past)    Current Outpatient Medications on File Prior to Visit  Medication Sig Dispense Refill  . allopurinol (ZYLOPRIM) 300 MG tablet Take 1 tablet (300 mg total) by mouth daily. 90 tablet 3  . aspirin 81 MG tablet Take 81 mg by mouth daily.    Marland Kitchen atorvastatin (LIPITOR) 40 MG tablet Take 1 tablet (40 mg total) by mouth daily. 90 tablet 3  . Blood Glucose Monitoring Suppl (ONE TOUCH ULTRA 2) w/Device KIT 1 kit by Does not apply route 2 (two) times daily. 1 each 0  . carbamazepine (TEGRETOL XR) 100 MG 12 hr tablet TAKE 1 TABLET BY MOUTH EVERY DAY 30 tablet 3  . cholecalciferol (VITAMIN D) 1000 units tablet Take 1,000 Units by mouth daily.    Marland Kitchen CINNAMON PO Take by mouth daily.     . diclofenac (VOLTAREN) 75 MG EC tablet TAKE 1 TABLET BY MOUTH TWICE A DAY AS NEEDED 60 tablet 0  . fexofenadine (ALLEGRA) 180 MG tablet Take 180 mg by mouth daily.    . fish oil-omega-3 fatty acids 1000 MG capsule Take 2 g by mouth 2 (two) times daily.     Marland Kitchen glucosamine-chondroitin 500-400 MG tablet Take 2 tablets by mouth 3 (three) times daily.    Marland Kitchen glucose blood (ONE TOUCH ULTRA TEST) test strip Use as instructed 200 each 12  . Lancets (ONETOUCH ULTRASOFT) lancets Use as instructed 200 each 12  . Melatonin 3 MG TABS Take 3 mg by mouth as needed.    Marland Kitchen MILK THISTLE PO Take by mouth daily.     . Multiple Vitamin (MULTIVITAMIN) tablet Take 1 tablet by mouth daily.    . nitroGLYCERIN (NITRODUR - DOSED IN MG/24 HR) 0.2 mg/hr patch Apply 1/4th patch to affected achilles, change daily 30 patch 1  . omega-3 acid ethyl esters (LOVAZA) 1 g capsule Take 2 capsules (2 g total) by mouth 2 (two) times daily. 120 capsule 5  . carbamazepine (TEGRETOL) 100 MG chewable tablet CHEW 1 TABLET AS DIRECTED/AS NEEDED FOR TRIGEMINAL NEURALGIA FLARES. 30 tablet 0  . colchicine 0.6 MG tablet Take 2 tablets by mouth at onset of gout flare.  May repeat 1 tablet in 1 hour if needed (max 3  tabs/24 hr) 20 tablet 0  . naproxen sodium (ANAPROX) 220 MG tablet Take 220 mg by mouth 2 (two) times daily with a meal.    . valACYclovir (VALTREX) 1000 MG tablet Take 2 tablets at onset of cold sore and repeat once in 12 hours (4 tablets/course) 28 tablet 3   No current facility-administered medications on file prior to visit.     Past Surgical History:  Procedure Laterality Date  . COLONOSCOPY  11/01/2010   normal; repeat in 10 years.  [facility].    Allergies  Allergen Reactions  . Minocycline Anaphylaxis  . Penicillins Hives    Social History   Socioeconomic History  . Marital status: Married    Spouse name: Not on file  . Number of children: Not on file  . Years of education: Not on file  . Highest education level:  Not on file  Occupational History  . Not on file  Social Needs  . Financial resource strain: Not on file  . Food insecurity:    Worry: Not on file    Inability: Not on file  . Transportation needs:    Medical: Not on file    Non-medical: Not on file  Tobacco Use  . Smoking status: Never Smoker  . Smokeless tobacco: Never Used  Substance and Sexual Activity  . Alcohol use: Yes    Alcohol/week: 14.0 standard drinks    Types: 14 Standard drinks or equivalent per week    Comment: 2-3 glasses of wine or beer/d  . Drug use: No  . Sexual activity: Yes    Partners: Female  Lifestyle  . Physical activity:    Days per week: Not on file    Minutes per session: Not on file  . Stress: Not on file  Relationships  . Social connections:    Talks on phone: Not on file    Gets together: Not on file    Attends religious service: Not on file    Active member of club or organization: Not on file    Attends meetings of clubs or organizations: Not on file    Relationship status: Not on file  . Intimate partner violence:    Fear of current or ex partner: Not on file    Emotionally abused: Not on file    Physically abused: Not on file    Forced sexual activity: Not on file  Other Topics Concern  . 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   Wife also retired spring 2018      Tobacco: none      Alcohol:  2-3 glasses of wine daily      Drugs: none      Exercising: walking 30-60 minutes daily. No regular weight-bearing exercise      Seatbelt: 100%      Guns:  Loaded partially secured; no children in house.      Sunscreen:  SPF 30-50.    Family History  Problem Relation Age of Onset  . Colon polyps Father   . Diabetes Father   . Arthritis Father   .  Heart disease Father        valve replacement.  . Hyperlipidemia Father   . Cancer  Father        skin  . Parkinson's disease Father   . Pulmonary embolism Mother   . Heart disease Maternal Grandfather   . Diabetes Brother   . Hypertension Brother   . Hyperlipidemia Brother   . Cancer Brother        skin  . Kidney Stones Daughter     BP 128/78   Ht 5' 8.5" (1.74 m)   Wt 195 lb (88.5 kg)   BMI 29.22 kg/m   Review of Systems: See HPI above.     Objective:  Physical Exam:  Gen: NAD, comfortable in exam room  Left foot/ankle: Very small haglund deformity.  No gross deformity, swelling, ecchymoses FROM with 5/5 strength all directions. No TTP Negative ant drawer and talar tilt.   Negative syndesmotic compression. Negative calcaneal squeeze. Thompsons test negative. NV intact distally.   Assessment & Plan:  1. Left achilles tendinopathy - improving.  Continue home exercises.  Stop diclofenac.  Nitro patch for 6 more weeks.  Heel lifts.  F/u in 6 weeks or prn.  Consider increased nitro dosage, custom orthotics if not improving.

## 2018-09-24 ENCOUNTER — Other Ambulatory Visit: Payer: Self-pay | Admitting: Family Medicine

## 2018-09-24 DIAGNOSIS — M109 Gout, unspecified: Secondary | ICD-10-CM

## 2018-09-25 NOTE — Telephone Encounter (Signed)
Last filled 1/30 for #60 (and 1/6 prior to that).  Per Dr. Ericka Pontiff last note, was taking once daily.  This is not supposed to be a chronic, longterm regular med.   Deny/advise pt

## 2018-09-25 NOTE — Telephone Encounter (Signed)
Is this okay to refill? 

## 2018-09-25 NOTE — Telephone Encounter (Signed)
Patient states he did not request this and he has actually cut back on this since last visit with Dr. Barbaraann Barthel.

## 2018-10-18 ENCOUNTER — Encounter: Payer: Self-pay | Admitting: Family Medicine

## 2018-10-21 ENCOUNTER — Encounter: Payer: Self-pay | Admitting: Family Medicine

## 2018-10-25 ENCOUNTER — Other Ambulatory Visit: Payer: Medicare Other

## 2018-10-26 ENCOUNTER — Ambulatory Visit: Payer: Medicare Other | Admitting: Family Medicine

## 2018-10-27 ENCOUNTER — Encounter: Payer: Self-pay | Admitting: Family Medicine

## 2018-12-04 ENCOUNTER — Other Ambulatory Visit: Payer: Self-pay | Admitting: Family Medicine

## 2018-12-04 DIAGNOSIS — R809 Proteinuria, unspecified: Principal | ICD-10-CM

## 2018-12-04 DIAGNOSIS — E1129 Type 2 diabetes mellitus with other diabetic kidney complication: Secondary | ICD-10-CM

## 2018-12-06 ENCOUNTER — Other Ambulatory Visit: Payer: Self-pay | Admitting: Family Medicine

## 2018-12-06 ENCOUNTER — Encounter: Payer: Self-pay | Admitting: Family Medicine

## 2018-12-06 DIAGNOSIS — E790 Hyperuricemia without signs of inflammatory arthritis and tophaceous disease: Secondary | ICD-10-CM

## 2018-12-06 DIAGNOSIS — E782 Mixed hyperlipidemia: Principal | ICD-10-CM

## 2018-12-06 DIAGNOSIS — M109 Gout, unspecified: Secondary | ICD-10-CM

## 2018-12-06 DIAGNOSIS — E1169 Type 2 diabetes mellitus with other specified complication: Secondary | ICD-10-CM

## 2018-12-06 NOTE — Telephone Encounter (Signed)
Looks like it is time for him to have an appointment.  Do not let him run out of meds but get him set up

## 2018-12-06 NOTE — Telephone Encounter (Signed)
Is this ok to refill?  

## 2018-12-07 MED ORDER — ALLOPURINOL 300 MG PO TABS: 300.0000 mg | ORAL_TABLET | Freq: Every day | ORAL | 0 refills | Status: DC

## 2018-12-07 MED ORDER — ATORVASTATIN CALCIUM 40 MG PO TABS: 40.0000 mg | ORAL_TABLET | Freq: Every day | ORAL | 0 refills | Status: DC

## 2018-12-07 NOTE — Telephone Encounter (Signed)
Pt was notified through mychart message from 5/7 that he got 30 days but needed an appt

## 2018-12-18 ENCOUNTER — Other Ambulatory Visit: Payer: Self-pay | Admitting: *Deleted

## 2018-12-18 MED ORDER — NITROGLYCERIN 0.2 MG/HR TD PT24: MEDICATED_PATCH | TRANSDERMAL | 1 refills | Status: DC

## 2018-12-19 ENCOUNTER — Encounter: Payer: Self-pay | Admitting: Family Medicine

## 2018-12-20 ENCOUNTER — Telehealth: Payer: Self-pay | Admitting: *Deleted

## 2018-12-20 NOTE — Telephone Encounter (Signed)
Patient called back and scheduled labs for 6/4 and wanted to do a virtual visit 01/10/19. I hope that is ok and you will still be doing them in June, let me know if not. Thanks.

## 2018-12-20 NOTE — Telephone Encounter (Signed)
I hope to keep many patient visits virtual, even when I'm back in the office. Thanks for scheduling this.

## 2018-12-31 ENCOUNTER — Other Ambulatory Visit: Payer: Self-pay | Admitting: Family Medicine

## 2018-12-31 DIAGNOSIS — E790 Hyperuricemia without signs of inflammatory arthritis and tophaceous disease: Secondary | ICD-10-CM

## 2018-12-31 DIAGNOSIS — M109 Gout, unspecified: Secondary | ICD-10-CM

## 2018-12-31 DIAGNOSIS — E1169 Type 2 diabetes mellitus with other specified complication: Secondary | ICD-10-CM

## 2019-01-01 ENCOUNTER — Other Ambulatory Visit: Payer: Self-pay | Admitting: *Deleted

## 2019-01-04 ENCOUNTER — Other Ambulatory Visit: Payer: Self-pay

## 2019-01-04 ENCOUNTER — Other Ambulatory Visit: Payer: Medicare Other

## 2019-01-04 ENCOUNTER — Other Ambulatory Visit: Payer: Self-pay | Admitting: Family Medicine

## 2019-01-04 DIAGNOSIS — Z5181 Encounter for therapeutic drug level monitoring: Secondary | ICD-10-CM

## 2019-01-04 DIAGNOSIS — M109 Gout, unspecified: Secondary | ICD-10-CM

## 2019-01-04 DIAGNOSIS — Z125 Encounter for screening for malignant neoplasm of prostate: Secondary | ICD-10-CM | POA: Diagnosis not present

## 2019-01-04 DIAGNOSIS — G500 Trigeminal neuralgia: Secondary | ICD-10-CM

## 2019-01-04 DIAGNOSIS — E1129 Type 2 diabetes mellitus with other diabetic kidney complication: Secondary | ICD-10-CM | POA: Diagnosis not present

## 2019-01-04 DIAGNOSIS — E782 Mixed hyperlipidemia: Secondary | ICD-10-CM | POA: Diagnosis not present

## 2019-01-04 DIAGNOSIS — E1169 Type 2 diabetes mellitus with other specified complication: Secondary | ICD-10-CM

## 2019-01-04 DIAGNOSIS — I10 Essential (primary) hypertension: Secondary | ICD-10-CM

## 2019-01-04 DIAGNOSIS — R809 Proteinuria, unspecified: Secondary | ICD-10-CM | POA: Diagnosis not present

## 2019-01-04 LAB — LIPID PANEL

## 2019-01-05 LAB — COMPREHENSIVE METABOLIC PANEL
ALT: 33 IU/L (ref 0–44)
AST: 22 IU/L (ref 0–40)
Albumin/Globulin Ratio: 2.2 (ref 1.2–2.2)
Albumin: 4.7 g/dL (ref 3.8–4.8)
Alkaline Phosphatase: 46 IU/L (ref 39–117)
BUN/Creatinine Ratio: 30 — ABNORMAL HIGH (ref 10–24)
BUN: 23 mg/dL (ref 8–27)
Bilirubin Total: 0.5 mg/dL (ref 0.0–1.2)
CO2: 22 mmol/L (ref 20–29)
Calcium: 9.8 mg/dL (ref 8.6–10.2)
Chloride: 97 mmol/L (ref 96–106)
Creatinine, Ser: 0.77 mg/dL (ref 0.76–1.27)
GFR calc Af Amer: 109 mL/min/{1.73_m2} (ref >59)
GFR calc non Af Amer: 95 mL/min/{1.73_m2} (ref >59)
Globulin, Total: 2.1 g/dL (ref 1.5–4.5)
Glucose: 135 mg/dL — ABNORMAL HIGH (ref 65–99)
Potassium: 4.9 mmol/L (ref 3.5–5.2)
Sodium: 135 mmol/L (ref 134–144)
Total Protein: 6.8 g/dL (ref 6.0–8.5)

## 2019-01-05 LAB — CBC WITH DIFFERENTIAL/PLATELET
Basophils Absolute: 0 10*3/uL (ref 0.0–0.2)
Basos: 1 %
EOS (ABSOLUTE): 0.1 10*3/uL (ref 0.0–0.4)
Eos: 2 %
Hematocrit: 45.5 % (ref 37.5–51.0)
Hemoglobin: 15.7 g/dL (ref 13.0–17.7)
Immature Grans (Abs): 0 10*3/uL (ref 0.0–0.1)
Immature Granulocytes: 0 %
Lymphocytes Absolute: 1.8 10*3/uL (ref 0.7–3.1)
Lymphs: 30 %
MCH: 32.1 pg (ref 26.6–33.0)
MCHC: 34.5 g/dL (ref 31.5–35.7)
MCV: 93 fL (ref 79–97)
Monocytes Absolute: 0.8 10*3/uL (ref 0.1–0.9)
Monocytes: 13 %
Neutrophils Absolute: 3.2 10*3/uL (ref 1.4–7.0)
Neutrophils: 54 %
Platelets: 165 10*3/uL (ref 150–450)
RBC: 4.89 x10E6/uL (ref 4.14–5.80)
RDW: 12.7 % (ref 11.6–15.4)
WBC: 5.9 10*3/uL (ref 3.4–10.8)

## 2019-01-05 LAB — MICROALBUMIN / CREATININE URINE RATIO
Creatinine, Urine: 39.1 mg/dL
Microalb/Creat Ratio: 431 mg/g creat — ABNORMAL HIGH (ref 0–29)
Microalbumin, Urine: 168.5 ug/mL

## 2019-01-05 LAB — PSA: Prostate Specific Ag, Serum: 0.7 ng/mL (ref 0.0–4.0)

## 2019-01-05 LAB — LIPID PANEL
Chol/HDL Ratio: 2.7 ratio (ref 0.0–5.0)
Cholesterol, Total: 151 mg/dL (ref 100–199)
HDL: 55 mg/dL (ref >39)
LDL Calculated: 60 mg/dL (ref 0–99)
Triglycerides: 178 mg/dL — ABNORMAL HIGH (ref 0–149)
VLDL Cholesterol Cal: 36 mg/dL (ref 5–40)

## 2019-01-05 LAB — HEMOGLOBIN A1C
Est. average glucose Bld gHb Est-mCnc: 143 mg/dL
Hgb A1c MFr Bld: 6.6 % — ABNORMAL HIGH (ref 4.8–5.6)

## 2019-01-05 LAB — TSH: TSH: 1.51 u[IU]/mL (ref 0.450–4.500)

## 2019-01-05 LAB — URIC ACID: Uric Acid: 4.5 mg/dL (ref 3.7–8.6)

## 2019-01-09 ENCOUNTER — Encounter: Payer: Self-pay | Admitting: Family Medicine

## 2019-01-09 NOTE — Progress Notes (Signed)
Start time: 12:31 End time: 1:13   Virtual Visit via Video Note  I connected with Brett Johnson on 01/09/19 at 11:45 AM EDT by a video enabled telemedicine application and verified that I am speaking with the correct person using two identifiers.  Location: Patient: home (granddaughter also home) Provider: office   I discussed the limitations of evaluation and management by telemedicine and the availability of in person appointments. The patient expressed understanding and agreed to proceed. He consents to his insurance being filed for this visit.  History of Present Illness:  Chief Complaint  Patient presents with  . Medication Management    Patient presents for follow up on diabetes. His A1c was up to 7.1 in August. Metformin was prescribed, but never started.  He instead was checking his sugars regularly, improved diet (cut back on carbs) and exercise.  He had increased exercise, lost some weight and A1c came down to 6.3% in December. He continued to have 2 glasses of wine most nights.  His sugars have been running 100-179, mostly 130's-140's. Checking 2-3 times/week. He spent a lot of time in [place] helping care for the grandkids (so the parents could work, during pandemic), eating more mac and cheese while there. Overall doing well.  Once had pasta, and noticed sugar was up to 179 the following morning.   He denies polydipsia, polyuria. He is thirsty at night sometimes, when he is getting to void. Eye exam is UTD (07/2018). He checks his feet regularly and denies numbness/burning/neuropathy or any sores/lesions.  Hyperlipidemia: Following a lowfat, low cholesterol diet. Eats healthy overall. He has had some more cheese recently. Tolerating statin without side effect.Changed from Vascepa to Lovaza due to cost.  Had labs checked prior to visit, see below. He reports compliance with the medications, including the Lovaza.  Hypertension follow-up: Blood pressures  elsewhere are running 130/67, 124/79, 124/87, 126/71, 132/72.  (checks 2-3x/week at home)  Tolerating losartan without side effects.Denies dizziness, headaches, chest pain.   He was referred to cardiology after his physical for abnormal EKG.  He denies any palpitations or symptoms. Normal evaluations at that time.  He denies any chest pain, DOE, palpitations or other concerns.  Gout--taking allopurinol. He had a flare when he started glucosamine/chondroitin (R knee, R elbow, relieved by diclofenac), doing better since on the lower dose of the supplement. Previous flares have been in toes, ankle.  No flares since last visit.  Trigeminal neuralgia since childhood. Sometimes on the right, other times on the left. Saw neuro in the past and put on tegretol. Weaned to every other day, which still controls it. If he develops symptoms, he increases the dose back up to daily and also uses short-acting. Flares are rare, only one in the last year.  Achilles pain was getting better, however in the last week or two he has had some increased heel pain.  He thinks it is different than the achilles. He restarted diclofenac, which is helping. Pain was on bottom and side, under his ankle. No change in activity.  New shoes at beach, can't remember where he was when pain started. Improving with use of NSAID and nitro patch.    PMH, PSH, SH reviewed  Outpatient Encounter Medications as of 01/10/2019  Medication Sig Note  . allopurinol (ZYLOPRIM) 300 MG tablet Take 1 tablet (300 mg total) by mouth daily.   Marland Kitchen aspirin 81 MG tablet Take 81 mg by mouth daily. 01/06/2017: Occasional holds if he is having bleeding "springing  leaks"  . atorvastatin (LIPITOR) 40 MG tablet TAKE 1 TABLET BY MOUTH EVERY DAY   . atorvastatin (LIPITOR) 40 MG tablet Take 1 tablet (40 mg total) by mouth daily.   . Blood Glucose Monitoring Suppl (ONE TOUCH ULTRA 2) w/Device KIT 1 kit by Does not apply route 2 (two) times daily.   .  carbamazepine (TEGRETOL XR) 100 MG 12 hr tablet TAKE 1 TABLET BY MOUTH EVERY DAY   . carbamazepine (TEGRETOL) 100 MG chewable tablet CHEW 1 TABLET AS DIRECTED/AS NEEDED FOR TRIGEMINAL NEURALGIA FLARES.   . cholecalciferol (VITAMIN D) 1000 units tablet Take 1,000 Units by mouth daily. 03/08/2018: 1000 IU (one bottle he has is 5000)  . CINNAMON PO Take by mouth daily.   . colchicine 0.6 MG tablet Take 2 tablets by mouth at onset of gout flare.  May repeat 1 tablet in 1 hour if needed (max 3 tabs/24 hr)   . diclofenac (VOLTAREN) 75 MG EC tablet TAKE 1 TABLET BY MOUTH TWICE A DAY AS NEEDED   . fexofenadine (ALLEGRA) 180 MG tablet Take 180 mg by mouth daily. 09/08/2017: Used for hives, resolved; uses prn allergies (rare)  . fish oil-omega-3 fatty acids 1000 MG capsule Take 2 g by mouth 2 (two) times daily.  01/06/2017: Takes 2 every evening  . glucosamine-chondroitin 500-400 MG tablet Take 2 tablets by mouth 3 (three) times daily. 09/08/2017: Takes 1-2 pills/day  . glucose blood (ONE TOUCH ULTRA TEST) test strip Use as instructed   . Lancets (ONETOUCH ULTRASOFT) lancets Use as instructed   . losartan (COZAAR) 25 MG tablet TAKE 1 TABLET BY MOUTH EVERY DAY   . Melatonin 3 MG TABS Take 3 mg by mouth as needed. 01/06/2017: Takes occasionally  . MILK THISTLE PO Take by mouth daily.  09/08/2017: occasionally  . Multiple Vitamin (MULTIVITAMIN) tablet Take 1 tablet by mouth daily. 09/08/2017: Remembers about 4x/week  . naproxen sodium (ANAPROX) 220 MG tablet Take 220 mg by mouth 2 (two) times daily with a meal. 03/24/2016: Uses Aleve prn pain, headache  . nitroGLYCERIN (NITRODUR - DOSED IN MG/24 HR) 0.2 mg/hr patch Apply 1/4th patch to affected achilles, change daily   . omega-3 acid ethyl esters (LOVAZA) 1 g capsule Take 2 capsules (2 g total) by mouth 2 (two) times daily.   . valACYclovir (VALTREX) 1000 MG tablet Take 2 tablets at onset of cold sore and repeat once in 12 hours (4 tablets/course)   . metFORMIN  (GLUCOPHAGE-XR) 500 MG 24 hr tablet Take 1 tablet (500 mg total) by mouth daily with breakfast.   . [DISCONTINUED] allopurinol (ZYLOPRIM) 300 MG tablet TAKE 1 TABLET BY MOUTH EVERY DAY    No facility-administered encounter medications on file as of 01/10/2019.    (NOT taking metformin prior to today's visit)  Allergies  Allergen Reactions  . Minocycline Anaphylaxis  . Penicillins Hives    ROS:  No fever, chills, URI symptoms, headaches, dizziness, chest pain, palpitations.  Some decreased vision in R eye, known small cataract. No GI or GU complaints.  No numbness, tingling, bleeding, bruising or skin concerns. Some intermittent urinary urgency; no dysuria.  Up 3x/night to void, unchanged.     Observations/Objective:  BP 133/80   Pulse (!) 57   Wt 192 lb (87.1 kg)   BMI 28.77 kg/m    Wt Readings from Last 3 Encounters:  09/05/18 195 lb (88.5 kg)  07/19/18 200 lb (90.7 kg)  07/05/18 190 lb (86.2 kg)   Well developed, pleasant male,  in good spirits at the beginning of the visit, but appeared defeated and somewhat down after reviewing his results (and being put on metformin). He is alert, oriented.  Normal mood, affect, with full range of affect (with change noted above by end of visit). Normal cranial nerves, eye contact, grooming. Exam is otherwise limited due to virtual nature of the visit.  Lab Results  Component Value Date   HGBA1C 6.6 (H) 01/04/2019   Lab Results  Component Value Date   CHOL 151 01/04/2019   HDL 55 01/04/2019   LDLCALC 60 01/04/2019   LDLDIRECT 126.5 02/18/2012   TRIG 178 (H) 01/04/2019   CHOLHDL 2.7 01/04/2019   Lab Results  Component Value Date   LABURIC 4.5 01/04/2019   Lab Results  Component Value Date   TSH 1.510 01/04/2019   PSA 0.7 (this visit was originally a CPE scheduled for March, which was canceled, rescheduled CPE portion to December).    Chemistry      Component Value Date/Time   NA 135 01/04/2019 0915   K 4.9 01/04/2019  0915   CL 97 01/04/2019 0915   CO2 22 01/04/2019 0915   BUN 23 01/04/2019 0915   CREATININE 0.77 01/04/2019 0915   CREATININE 0.90 01/05/2017 0757      Component Value Date/Time   CALCIUM 9.8 01/04/2019 0915   ALKPHOS 46 01/04/2019 0915   AST 22 01/04/2019 0915   ALT 33 01/04/2019 0915   BILITOT 0.5 01/04/2019 0915     Fasting glu 135  Urine microalbumin/Cr ratio is elevated at 431   Assessment and Plan:  Type 2 diabetes mellitus with microalbuminuria, without long-term current use of insulin (HCC) - start metformin due to microalbuminuria; BP's fairly good. Risks/SE reviewed. Counseled extensively re: natural course of DM, need for treatment - Plan: metFORMIN (GLUCOPHAGE-XR) 500 MG 24 hr tablet, Comprehensive metabolic panel, Urinalysis, Complete, Hemoglobin A1c  Mixed hyperlipidemia due to type 2 diabetes mellitus (HCC) - borderline TG. Cont statin, Lovaza, lowfat diet.  Essential hypertension - well controlled per home numbers, so losartan dose not increased. Increase to 46m if BP's remain >130 consistently, or if microalbuminuria persists - Plan: Comprehensive metabolic panel, Urinalysis, Complete  Acute gout of hand, unspecified cause, unspecified laterality - continue allopurinol  Medication monitoring encounter - Plan: Microalbumin / creatinine urine ratio, Comprehensive metabolic panel, Urinalysis, Complete   Start metformin  Hold off on increasing losartan for now. (BP's at home are good). Recheck urine microalb in 6 months. May need to increase losartan then.  Extensive counseling provided (pt not happy about need for meds--discussed pathophysiology and natural course of DM, microalbuminuria, ways to treat, SE of metformin, what he can control, what he cannot, etc). Praised for his compliance, and not to feel bad that additional treatments are needed, not his fault or under his control.  F/u as scheduled for CPE in December urine microalb; A1c, urinalysis, c-met  prior to CPE    Follow Up Instructions:    I discussed the assessment and treatment plan with the patient. The patient was provided an opportunity to ask questions and all were answered. The patient agreed with the plan and demonstrated an understanding of the instructions.   The patient was advised to call back or seek an in-person evaluation if the symptoms worsen or if the condition fails to improve as anticipated.  I provided 42 minutes of non-face-to-face time during this encounter. More than 1/2 spent counseling, as described above.   EVikki Ports MD

## 2019-01-10 ENCOUNTER — Ambulatory Visit (INDEPENDENT_AMBULATORY_CARE_PROVIDER_SITE_OTHER): Payer: Medicare Other | Admitting: Family Medicine

## 2019-01-10 ENCOUNTER — Other Ambulatory Visit: Payer: Self-pay

## 2019-01-10 ENCOUNTER — Encounter: Payer: Self-pay | Admitting: Family Medicine

## 2019-01-10 VITALS — BP 133/80 | HR 57 | Wt 192.0 lb

## 2019-01-10 DIAGNOSIS — I10 Essential (primary) hypertension: Secondary | ICD-10-CM | POA: Diagnosis not present

## 2019-01-10 DIAGNOSIS — Z5181 Encounter for therapeutic drug level monitoring: Secondary | ICD-10-CM | POA: Diagnosis not present

## 2019-01-10 DIAGNOSIS — M109 Gout, unspecified: Secondary | ICD-10-CM

## 2019-01-10 DIAGNOSIS — E1169 Type 2 diabetes mellitus with other specified complication: Secondary | ICD-10-CM

## 2019-01-10 DIAGNOSIS — R809 Proteinuria, unspecified: Secondary | ICD-10-CM | POA: Diagnosis not present

## 2019-01-10 DIAGNOSIS — E782 Mixed hyperlipidemia: Secondary | ICD-10-CM

## 2019-01-10 DIAGNOSIS — E1129 Type 2 diabetes mellitus with other diabetic kidney complication: Secondary | ICD-10-CM

## 2019-01-10 MED ORDER — METFORMIN HCL ER 500 MG PO TB24: 500.0000 mg | ORAL_TABLET | Freq: Every day | ORAL | 5 refills | Status: DC

## 2019-01-10 NOTE — Progress Notes (Signed)
done

## 2019-01-10 NOTE — Patient Instructions (Addendum)
We are starting metformin today for your diabetes, due to seeing microalbumin in the urine. Take it once daily with breakfast. If you have loose stools, add metamucil once daily.  Contact us if you continue to have issues with tolerability. Continue the losartan (which also helps protect the kidneys from diabetes).  If your blood pressure is consistently over 130-135/80-85, then we should also adjust the losartan dose up to 50mg .  Right now, you have frequent 120's-130 so okay to hold where it is.  Continue to limit the carbs and sweets in your diet, as well as trying to get regular exercise and work further on losing weight from the waistline.  Continue to check your blood sugars, and limit alcohol intake.   Microalbumin Test Why am I having this test? Albumin is a protein in the body that helps regulate how much fluid is in the blood. Normally, when the kidneys filter waste from the bloodstream, waste leaves the body through urine and important substances like albumin remain in the blood. If the kidneys are damaged, they may no longer be able to keep albumin in the blood. This causes small amounts of albumin (microalbumin, MA) in the urine. You may have this test:  If you have signs of kidney damage, such as foamy urine or swelling in the hands, feet, abdomen, or face.  To help evaluate kidney function after other kidney test results have been normal.  To monitor treatment for diabetes (diabetes mellitus), especially if your blood sugar (glucose) has been poorly controlled or you have signs of kidney damage. MA in the urine is a common complication of diabetes.  To monitor for complications caused by high blood pressure (hypertension).  To help diagnose cardiovascular disease or a heart attack. What is being tested? This test measures the amount of MA in your urine. You may have your creatinine level tested at the same time as MA. Creatinine is a waste product that the kidneys filter out of  the blood. Testing creatinine levels provides more information about kidney function. What kind of sample is taken?  A urine sample is required for this test. You may be asked to collect urine samples at home over a period of 24 hours. How do I collect samples at home? When collecting a urine sample at home, make sure you:  Use supplies and instructions that you received from the lab.  Collect urine only in the germ-free (sterile) cup that you received from the lab.  Do not let any toilet paper or stool (feces) get into the cup.  Refrigerate the sample until you can return it to the lab.  Return the sample(s) to the lab as instructed. How do I prepare for this test? Tell your health care provider about all medicines you are taking, including vitamins, herbs, eye drops, creams, and over-the-counter medicines. Follow instructions from your health care provider about changing or stopping your regular medicines. This is especially important if you are taking diabetes medicines or blood thinners. How are the results reported? Your test results will be reported as a value that indicates how much MA is in your urine. This may be given as:  Milligrams of MA per deciliter of urine (mg/dL).  Milligrams of MA per gram of creatinine (mg/g creatinine), if creatinine was also tested. Your health care provider will compare your results to normal ranges that were established after testing a large group of people (reference ranges). Reference ranges may vary among labs and hospitals. For this test, common  reference ranges are:  MA only: 0-2 mg/dL.  MA and creatinine: ? Men: 0-17 mg/g creatinine. ? Women: 0-25 mg/g creatinine. What do the results mean? A result that is within the reference range means that you have a normal amount of MA in your urine. This may mean that:  Your kidneys are functioning normally.  Your diabetes or hypertension treatment is working effectively. Results that are  higher than the reference range may mean that you have:  Poorly controlled diabetes. Your treatment plan may need to be adjusted.  Narrowing of the arteries due to plaque buildup (atherosclerosis).  Kidney damage due to hypertension or diabetes (nephropathy). Talk with your health care provider about what your results mean. Questions to ask your health care provider Ask your health care provider, or the department that is doing the test:  When will my results be ready?  How will I get my results?  What are my treatment options?  What other tests do I need?  What are my next steps? Summary  Albumin is a protein that helps regulate how much fluid is in your blood.  If your kidneys are damaged, they may no longer be able to keep albumin in your blood. This causes small amounts of albumin (microalbumin, MA) in your urine.  High MA levels may indicate kidney damage from diabetes or hypertension (nephropathy).  Talk with your health care provider about what your results mean. This information is not intended to replace advice given to you by your health care provider. Make sure you discuss any questions you have with your health care provider. Document Released: 08/21/2004 Document Revised: 04/06/2017 Document Reviewed: 04/06/2017 Elsevier Interactive Patient Education  2019 [employer].

## 2019-01-11 NOTE — Progress Notes (Signed)
Done

## 2019-02-17 ENCOUNTER — Other Ambulatory Visit: Payer: Self-pay | Admitting: Family Medicine

## 2019-02-17 DIAGNOSIS — E782 Mixed hyperlipidemia: Secondary | ICD-10-CM

## 2019-02-17 DIAGNOSIS — E1169 Type 2 diabetes mellitus with other specified complication: Secondary | ICD-10-CM

## 2019-02-27 ENCOUNTER — Other Ambulatory Visit: Payer: Self-pay | Admitting: Family Medicine

## 2019-02-27 DIAGNOSIS — E1129 Type 2 diabetes mellitus with other diabetic kidney complication: Secondary | ICD-10-CM

## 2019-02-28 DIAGNOSIS — L570 Actinic keratosis: Secondary | ICD-10-CM | POA: Diagnosis not present

## 2019-02-28 DIAGNOSIS — L819 Disorder of pigmentation, unspecified: Secondary | ICD-10-CM | POA: Diagnosis not present

## 2019-02-28 DIAGNOSIS — D225 Melanocytic nevi of trunk: Secondary | ICD-10-CM | POA: Diagnosis not present

## 2019-02-28 DIAGNOSIS — I788 Other diseases of capillaries: Secondary | ICD-10-CM | POA: Diagnosis not present

## 2019-02-28 DIAGNOSIS — Z85828 Personal history of other malignant neoplasm of skin: Secondary | ICD-10-CM | POA: Diagnosis not present

## 2019-02-28 DIAGNOSIS — D692 Other nonthrombocytopenic purpura: Secondary | ICD-10-CM | POA: Diagnosis not present

## 2019-02-28 DIAGNOSIS — D1801 Hemangioma of skin and subcutaneous tissue: Secondary | ICD-10-CM | POA: Diagnosis not present

## 2019-02-28 DIAGNOSIS — L821 Other seborrheic keratosis: Secondary | ICD-10-CM | POA: Diagnosis not present

## 2019-02-28 DIAGNOSIS — L814 Other melanin hyperpigmentation: Secondary | ICD-10-CM | POA: Diagnosis not present

## 2019-04-16 ENCOUNTER — Other Ambulatory Visit (INDEPENDENT_AMBULATORY_CARE_PROVIDER_SITE_OTHER): Payer: Medicare Other

## 2019-04-16 ENCOUNTER — Other Ambulatory Visit: Payer: Self-pay

## 2019-04-16 DIAGNOSIS — Z23 Encounter for immunization: Secondary | ICD-10-CM | POA: Diagnosis not present

## 2019-05-03 ENCOUNTER — Other Ambulatory Visit: Payer: Self-pay | Admitting: Family Medicine

## 2019-05-03 DIAGNOSIS — R809 Proteinuria, unspecified: Secondary | ICD-10-CM

## 2019-05-03 DIAGNOSIS — E1129 Type 2 diabetes mellitus with other diabetic kidney complication: Secondary | ICD-10-CM

## 2019-05-07 ENCOUNTER — Encounter: Payer: Self-pay | Admitting: Family Medicine

## 2019-05-07 MED ORDER — EPINEPHRINE 0.3 MG/0.3ML IJ SOAJ: 0.3000 mg | INTRAMUSCULAR | 0 refills | Status: DC | PRN

## 2019-07-06 ENCOUNTER — Other Ambulatory Visit: Payer: Self-pay | Admitting: Family Medicine

## 2019-07-06 DIAGNOSIS — R809 Proteinuria, unspecified: Secondary | ICD-10-CM

## 2019-07-06 DIAGNOSIS — E1129 Type 2 diabetes mellitus with other diabetic kidney complication: Secondary | ICD-10-CM

## 2019-07-10 ENCOUNTER — Encounter: Payer: Self-pay | Admitting: Family Medicine

## 2019-07-16 ENCOUNTER — Other Ambulatory Visit: Payer: Medicare Other

## 2019-07-17 ENCOUNTER — Telehealth: Payer: Self-pay | Admitting: Family Medicine

## 2019-07-17 ENCOUNTER — Other Ambulatory Visit: Payer: Self-pay

## 2019-07-17 ENCOUNTER — Other Ambulatory Visit: Payer: Medicare Other

## 2019-07-17 DIAGNOSIS — Z5181 Encounter for therapeutic drug level monitoring: Secondary | ICD-10-CM

## 2019-07-17 DIAGNOSIS — I10 Essential (primary) hypertension: Secondary | ICD-10-CM

## 2019-07-17 DIAGNOSIS — E1129 Type 2 diabetes mellitus with other diabetic kidney complication: Secondary | ICD-10-CM

## 2019-07-17 DIAGNOSIS — R809 Proteinuria, unspecified: Secondary | ICD-10-CM

## 2019-07-17 NOTE — Progress Notes (Signed)
Chief Complaint  Patient presents with  . Medicare Wellness    AWV/CPE. Does mention that over the last month his toes get numb/ tingly. Has diabetic eye appt this am, got canceled due to weather-they are going to call him and reschedule.     Brett Johnson is a 68 y.o. male who presents for annual wellness visit and follow-up on chronic medical conditions.  He had labs done prior to his visit.  Today he reports that he has had some erectile dysfunction, first noticed in the last month or so. He has never tried medications for this, but would be interested in doing so.   Follow up on diabetes.His A1c wasup to7.1 in August 2019. Metformin was prescribed, but never started. He instead was checking his sugars regularly, improved diet (cut back on carbs) and exercise. He had increased exercise, lost some weight and A1c came down to 6.3% in December. Last check was 6.6% in 01/2019.  It had gone up some, reportedly related to watching his grandkids a lot (so parents could work during pandemic), diet had changed some (eating more mac and cheese).  He continues to watch his grandkids a lot, reports he is eating healthy. He was started on Metformin in June, related to microalbuminuria.  He is tolerating this without side effects, taking it once daily.  Noted just slight bloating, no diarrhea. He continues to have 2 glasses of wine most nights (splitting bottle with his wife, over dinner), with a cocktail before dinner a few nights/week.  His sugars have been running 118-194 since September.  Averaging 144, checking once daily most days, usually in the morning. He brings in A1c done elsewhere on 05/17/2019, 6.6% Last eye exam was 07/2018. His appointment for this morning was cancelled due to weather. He checks his feet regularly. Recently has noted that his toes feel a little funny, a little tingly--only notices when walking barefoot, not with shoes on. Lab Results  Component Value Date   HGBA1C  6.6 (H) 01/04/2019    Hyperlipidemia: Following a lowfat, low cholesterol diet. Tolerating atorvastatin and Lovaza without side effects, reports compliance.  Lab Results  Component Value Date   CHOL 151 01/04/2019   HDL 55 01/04/2019   LDLCALC 60 01/04/2019   LDLDIRECT 126.5 02/18/2012   TRIG 178 (H) 01/04/2019   CHOLHDL 2.7 01/04/2019   Hypertension follow-up: Blood pressures elsewhere are running124-137/72-82 with one higher value reading 146/80. First reading is usually high, lower on repeat (these numbers are the repeated values). Tolerating losartan without side effects.Denies dizziness, headaches, chest pain.  He was referred to cardiology last year after EKG showed changes (poor RWP).  He had myocardial perfusion study which was low risk (mildly low EF noted), Holter showed PVC's and PAC's (not significant).  Echo showed EF 50% to 80%, grade 1 diastolic dysfunction, and mild-mod aortic regurgitation. No treatment changes were made.  Gout--taking allopurinol.He had a flare when he startedglucosamine/chondroitin (R knee, R elbow, relieved by diclofenac), improved when she changed to lower dose of the supplement. He stopped taking this supplement, and doesn't notice any difference in joints. He now takes turmeric instead.  He used to have stiffness in ankles with long drives. He hasn't had this recur. Previous gout flares have been in toes, ankle.No flares since last visit.  Trigeminal neuralgia since childhood. Sometimes on the right, other times on the left. Saw neuro in the past and put on tegretol. Weaned to every other day, which still controls it. If  he develops symptoms, he increases the dose back up to daily and also uses short-acting. Flares are rare, only one in the last year--had a very mild one a few weeks ago, used the short-acting tegretol and it never got bad.  Vitamin D deficiency--s/p 12 weeks of prescription therapy in the past.Last check was 28 in  09/2017 (when taking MVI 4x/week, and taking D daily, but unsure of dose). He was advised to look at his various bottles (home, [place] and beach) and ensure he was getting at least 2000 IU daily of D3.  He is currently taking MVI daily, and takes a separate D, but not daily (doesn't keep with his other medications), maybe 3-4x/week.   Immunization History  Administered Date(s) Administered  . Fluad Quad(high Dose 65+) 04/16/2019  . Influenza Split 05/11/2012  . Influenza, High Dose Seasonal PF 04/13/2017, 05/02/2018  . Influenza,inj,Quad PF,6+ Mos 04/23/2013, 05/06/2014, 05/12/2015, 04/28/2016  . Pneumococcal Conjugate-13 04/28/2016  . Pneumococcal Polysaccharide-23 09/08/2017  . Td 04/28/2016  . Tdap 05/02/2006  . Zoster 03/05/2013  . Zoster Recombinat (Shingrix) 01/06/2017, 04/13/2017   Last colonoscopy:2012, diverticulosis Last PSA Lab Results  Component Value Date   PSA1 0.7 01/04/2019   PSA1 0.7 09/08/2017   PSA 0.6 04/15/2016   PSA 0.66 11/13/2014   PSA 0.69 01/02/2014  Dentist:every 6 months Ophtho:yearly (due now) Exercise:Walks daily 30-60 minutes, most days (weather-permitting, usually 45 mins).  Light weights and pushups occasionally at home. Swims in the summer only.  Other doctors caring for patient include: GI: Dr. Lucio Edward Dentist: Dr. Satira Sark Ophtho: Dr. Thom Chimes (at [facility]) Derm: [facility] Ortho: Dr. Theda Sers Cardiologist: Dr. Tamala Julian Sports Med: Dr. Karlton Lemon (for achilles tendonitis) allergist and neuro many years ago  Depression screen:  Negative Fall Screen: Negative Functional Status Survey: unremarkable Mini-Cog screen normal See full screens in epic  End of Life Discussion:  Patient has a living will and medical power of attorney  PMH, PSH, Becker and FH were reviewed and updated  Outpatient Encounter Medications as of 07/18/2019  Medication Sig Note  . allopurinol (ZYLOPRIM) 300 MG tablet Take 1 tablet (300 mg total) by  mouth daily.   Marland Kitchen aspirin 81 MG tablet Take 81 mg by mouth daily. 01/06/2017: Occasional holds if he is having bleeding "springing leaks"  . atorvastatin (LIPITOR) 40 MG tablet Take 1 tablet (40 mg total) by mouth daily.   . Blood Glucose Monitoring Suppl (ONE TOUCH ULTRA 2) w/Device KIT 1 kit by Does not apply route 2 (two) times daily.   . carbamazepine (TEGRETOL XR) 100 MG 12 hr tablet TAKE 1 TABLET BY MOUTH EVERY DAY 07/18/2019: Takes it every other day  . cholecalciferol (VITAMIN D) 1000 units tablet Take 1,000 Units by mouth daily. 07/18/2019: Taking a few times/week  . CINNAMON PO Take by mouth daily.   Marland Kitchen glucose blood (ONE TOUCH ULTRA TEST) test strip Use as instructed   . Lancets (ONETOUCH ULTRASOFT) lancets Use as instructed   . losartan (COZAAR) 25 MG tablet TAKE 1 TABLET BY MOUTH EVERY DAY   . metFORMIN (GLUCOPHAGE-XR) 500 MG 24 hr tablet TAKE 1 TABLET BY MOUTH EVERY DAY WITH BREAKFAST   . MILK THISTLE PO Take by mouth daily.  09/08/2017: occasionally  . Multiple Vitamin (MULTIVITAMIN) tablet Take 1 tablet by mouth daily.   . nitroGLYCERIN (NITRODUR - DOSED IN MG/24 HR) 0.2 mg/hr patch Apply 1/4th patch to affected achilles, change daily   . omega-3 acid ethyl esters (LOVAZA) 1 g capsule  TAKE (2) CAPSULES BY MOUTH TWICE DAILY.   . [DISCONTINUED] atorvastatin (LIPITOR) 40 MG tablet TAKE 1 TABLET BY MOUTH EVERY DAY   . [DISCONTINUED] fish oil-omega-3 fatty acids 1000 MG capsule Take 2 g by mouth 2 (two) times daily.    . carbamazepine (TEGRETOL) 100 MG chewable tablet CHEW 1 TABLET AS DIRECTED/AS NEEDED FOR TRIGEMINAL NEURALGIA FLARES. (Patient not taking: Reported on 07/18/2019)   . colchicine 0.6 MG tablet Take 2 tablets by mouth at onset of gout flare.  May repeat 1 tablet in 1 hour if needed (max 3 tabs/24 hr) (Patient not taking: Reported on 07/18/2019)   . diclofenac (VOLTAREN) 75 MG EC tablet TAKE 1 TABLET BY MOUTH TWICE A DAY AS NEEDED (Patient not taking: Reported on 07/18/2019)   .  EPINEPHrine 0.3 mg/0.3 mL IJ SOAJ injection Inject 0.3 mLs (0.3 mg total) into the muscle as needed for anaphylaxis. (Patient not taking: Reported on 07/18/2019)   . fexofenadine (ALLEGRA) 180 MG tablet Take 180 mg by mouth daily. 09/08/2017: Used for hives, resolved; uses prn allergies (rare)  . Melatonin 3 MG TABS Take 3 mg by mouth as needed. 01/06/2017: Takes occasionally  . naproxen sodium (ANAPROX) 220 MG tablet Take 220 mg by mouth 2 (two) times daily with a meal. 03/24/2016: Uses Aleve prn pain, headache  . valACYclovir (VALTREX) 1000 MG tablet Take 2 tablets at onset of cold sore and repeat once in 12 hours (4 tablets/course) (Patient not taking: Reported on 07/18/2019)   . [DISCONTINUED] glucosamine-chondroitin 500-400 MG tablet Take 2 tablets by mouth 3 (three) times daily. 09/08/2017: Takes 1-2 pills/day   No facility-administered encounter medications on file as of 07/18/2019.   Taking turmeric also  Allergies  Allergen Reactions  . Minocycline Anaphylaxis  . Penicillins Hives    ROS:The patient denies anorexia, fever, weight changes, headaches, vision loss, decreased hearing, ear pain, chest pain, palpitations, dizziness, syncope, dyspnea on exertion, swelling, nausea, vomiting, diarrhea, constipation, abdominal pain, melena, hematochezia, indigestion/heartburn, hematuria, incontinence, nocturia, weakened urine stream, dysuria, genital lesions, joint pains, numbness, tingling (slight in feet, when barefoot, per HPI), weakness, suspicious skin lesions, depression, anxiety, abnormal bleeding/bruising, or enlarged lymph nodes.  Insomnia, no longer takes OTC meds.  Sleeping with fan helps some. Palpitations--not aware of these ED per HPI Congestion and cough at night--resolved (had for up to 9 months) Continues to have left heel pain at night only   PHYSICAL EXAM:  BP (!) 150/86   Pulse 80   Temp (!) 96.6 F (35.9 C) (Other (Comment))   Ht _0  (1.727 m)   Wt 195 lb 12.8 oz  (88.8 kg)   BMI 29.77 kg/m   Wt Readings from Last 3 Encounters:  07/18/19 195 lb 12.8 oz (88.8 kg)  01/10/19 192 lb (87.1 kg)  09/05/18 195 lb (88.5 kg)   154/60 on repeat by MD  General Appearance:  Alert, cooperative, no distress, appears stated age  Head:  Normocephalic, without obvious abnormality, atraumatic  Eyes:  PERRL, conjunctiva/corneas clear, EOM's intact, fundi benign  Ears:  Normal TM's and external ear canals  Nose: Not examined, wearing mask due to COVID-19 pandemic  Throat: Not examined, wearing mask due to COVID-19 pandemic  Neck: Supple, no lymphadenopathy; thyroid: no enlargement/tenderness/ nodules; no carotid bruit or JVD  Back:  Spine nontender, no curvature, ROM normal, no CVA tenderness  Lungs:  Clear to auscultation bilaterally without wheezes, rales orronchi; respirations unlabored  Chest Wall:  No tenderness or deformity  Heart:  Regular  rate and rhythm, S1 and S2 normal, no murmur, rub or gallop. There is frequent ectopy (extra beats, pauses), but overall rhythm is regular.  Breast Exam:  No chest wall tenderness, masses or gynecomastia  Abdomen:  Soft, non-tender, nondistended, normoactive bowel sounds, no masses, no hepatosplenomegaly  Genitalia:  Normal male external genitalia without lesions. Testicles without masses. No inguinal hernias.  Rectal:  Normal sphincter tone, no masses or tenderness; guaiac negative stool. Prostate smooth, no nodules, not enlarged.  Extremities: No clubbing, cyanosis or edema. nontender at L achilles tendon  Pulses: 2+ and symmetric all extremities  Skin: Skin color, texture, turgor normal, no rashes   Lymph nodes: Cervical, supraclavicular, and axillary nodes normal  Neurologic: CNII-XII intact, normal strength, sensation and gait; reflexes 2+ and symmetric throughout. Normal monofilament exam  Psych: Normal mood, affect, hygiene and  grooming.  Diabetic foot exam--normal   Lab Results  Component Value Date   HGBA1C 6.7 (H) 07/17/2019   Fasting glucose 134    Chemistry      Component Value Date/Time   NA 138 07/17/2019 0836   K 5.0 07/17/2019 0836   CL 100 07/17/2019 0836   CO2 21 07/17/2019 0836   BUN 21 07/17/2019 0836   CREATININE 0.88 07/17/2019 0836   CREATININE 0.90 01/05/2017 0757      Component Value Date/Time   CALCIUM 9.8 07/17/2019 0836   ALKPHOS 49 07/17/2019 0836   AST 22 07/17/2019 0836   ALT 26 07/17/2019 0836   BILITOT 0.5 07/17/2019 0836     Urinalysis--1+ protein, otherwise normal.  Urine microalbumin/Cr ratio *2 (down from 431, but still elevated).   ASSESSMENT/PLAN:  Medicare annual wellness visit, initial  Mixed hyperlipidemia due to type 2 diabetes mellitus ([place]) - LDL at goal, TG mildly elevated. Reviewed proper diet. Cont current meds. Cut back on ETOH, lowfat diet  Essential hypertension - BP elevated today, better at home. Verify accuracy of monitor. Consider increase losartan to 50 if BP stays high, or if microalbuminuria not resolving  Acute gout of hand, unspecified cause, unspecified laterality - cont allopurinol, no recent flares  Vitamin D deficiency - encouraged 2000 IU daily total (MVI plus supplement)  Trigeminal neuralgia - controlled on current regimen, infrequent flares  Erectile dysfunction, unspecified erectile dysfunction type - reviewed meds, including cialis, viagra, vs generic sildenafil.  To let me know if interested in rx, and where to send (check on cash price for sildenafil)  Type 2 diabetes mellitus with microalbuminuria, without long-term current use of insulin ([facility]) - increase metformin to 2/d. Encouraged to cut back on ETOH to help with glu, TG and wt - Plan: metFORMIN (GLUCOPHAGE-XR) 500 MG 24 hr tablet  BMI 29.0-29.9,adult - wt loss encouraged (but cutting back on ETOH)    To reschedule diabetic eye exam.  Discussed PSA screening  (risks/benefits), recommended at least 30 minutes of aerobic activity at least 5 days/week, weight-bearing exercise at least 2x/week; proper sunscreen use reviewed; healthy diet and alcohol recommendations (less than or equal to 2 drinks/day) reviewed; regular seatbelt use; changing batteries in smoke detectors.. Immunization recommendations discussed--UTD, continue yearly high dose flu shots.COVID vaccine when available. Colonoscopy recommendations reviewed, UTD   Medicare Attestation I have personally reviewed: The patient's medical and social history Their use of alcohol, tobacco or illicit drugs Their current medications and supplements The patient's functional ability including ADLs,fall risks, home safety risks, cognitive, and hearing and visual impairment Diet and physical activities Evidence for depression or mood disorders  The patient's  weight, height, BMI, and visual acuity have been recorded in the chart.  I have made referrals, counseling, and provided education to the patient based on review of the above and I have provided the patient with a written personalized care plan for preventive services.     ED-- Check on cost and quantity of generic sildenafil 42m tablet  (cash pay)--[facility], or [facility]. If you would like a prescription, let me know which pharmacy and which quantity.  Generally the instructions are to take between 2-5 tablets as needed (to be the equivalent of Viagra 50-1051mtablets)   Increase metformin to 2/d Consider increasing losartan if BP's remain elevated (need to confirm cuff) Or if microalbumin remains elevated

## 2019-07-17 NOTE — Patient Instructions (Addendum)
HEALTH MAINTENANCE RECOMMENDATIONS:  It is recommended that you get at least 30 minutes of aerobic exercise at least 5 days/week (for weight loss, you may need as much as 60-90 minutes). This can be any activity that gets your heart rate up. This can be divided in 10-15 minute intervals if needed, but try and build up your endurance at least once a week.  Weight bearing exercise is also recommended twice weekly.  Eat a healthy diet with lots of vegetables, fruits and fiber.  "Colorful" foods have a lot of vitamins (ie green vegetables, tomatoes, red peppers, etc).  Limit sweet tea, regular sodas and alcoholic beverages, all of which has a lot of calories and sugar.  Up to 2 alcoholic drinks daily may be beneficial for men (unless trying to lose weight, watch sugars).  Drink a lot of water.  Sunscreen of at least SPF 30 should be used on all sun-exposed parts of the skin when outside between the hours of 10 am and 4 pm (not just when at beach or pool, but even with exercise, golf, tennis, and yard work!)  Use a sunscreen that says "broad spectrum" so it covers both UVA and UVB rays, and make sure to reapply every 1-2 hours.  Remember to change the batteries in your smoke detectors when changing your clock times in the spring and fall. Carbon monoxide detectors are recommended for your home(s).  Use your seat belt every time you are in a car, and please drive safely and not be distracted with cell phones and texting while driving.    Brett Johnson , Thank you for taking time to come for your Medicare Wellness Visit. I appreciate your ongoing commitment to your health goals. Please review the following plan we discussed and let me know if I can assist you in the future.    This is a list of the screening recommended for you and due dates:  Health Maintenance  Topic Date Due  . Complete foot exam   09/08/2018  . Hemoglobin A1C  07/06/2019  . Eye exam for diabetics  08/01/2019  . Colon Cancer  Screening  11/16/2020  . Tetanus Vaccine  04/28/2026  . Flu Shot  Completed  .  Hepatitis C: One time screening is recommended by Center for Disease Control  (CDC) for  adults born from [birth year] through 1964.   Completed  . Pneumonia vaccines  Completed   Your foot exam was done today, A1c with labs yesterday. You are due for your diabetic eye exam--please reschedule and ask them to send [place] a copy of the report.  Check on cost and quantity of generic sildenafil 20mg  tablet  (cash pay)--at [facility] or [facility], or [facility]. If you would like a prescription, let me know which pharmacy and which quantity.  Generally the instructions are to take between 2-5 tablets as needed (to be the equivalent of Viagra 50-100mg  tablets)  At your convenience, please get [place] copies of your living will and healthcare power of attorney, in order to get scanned into your chart.  Increase the metformin to two tablets daily (can be taken together; split into twice daily only if it bothers your stomach).  Please return for a nurse visit with your blood pressure monitor, so that we can verify the accuracy.  Your BP was high in the office, whereas your numbers at home seemed better.  If monitor is accurate, continue to check, with goal being <130/80. If it is consistently higher than this, we  would increase the losartan to 50mg .  If we keep it at the 25mg , we will wait and see if your urine microalbumin improves with increasing the metformin dose.  If it doesn't, we will increase the losartan even if your BP looks fine.

## 2019-07-17 NOTE — Telephone Encounter (Signed)
Pt dropped of lab work and a copy of his blood pressure readings put in your folder, pt can be reached at [phone]

## 2019-07-17 NOTE — Telephone Encounter (Signed)
Noted.  He has appt tomorrow.

## 2019-07-18 ENCOUNTER — Ambulatory Visit (INDEPENDENT_AMBULATORY_CARE_PROVIDER_SITE_OTHER): Payer: Medicare Other | Admitting: Family Medicine

## 2019-07-18 ENCOUNTER — Encounter: Payer: Self-pay | Admitting: Family Medicine

## 2019-07-18 VITALS — BP 150/86 | HR 80 | Temp 96.6°F | Ht 68.0 in | Wt 195.8 lb

## 2019-07-18 DIAGNOSIS — Z0000 Encounter for general adult medical examination without abnormal findings: Secondary | ICD-10-CM

## 2019-07-18 DIAGNOSIS — R809 Proteinuria, unspecified: Secondary | ICD-10-CM

## 2019-07-18 DIAGNOSIS — E782 Mixed hyperlipidemia: Secondary | ICD-10-CM

## 2019-07-18 DIAGNOSIS — Z6829 Body mass index (BMI) 29.0-29.9, adult: Secondary | ICD-10-CM

## 2019-07-18 DIAGNOSIS — E559 Vitamin D deficiency, unspecified: Secondary | ICD-10-CM | POA: Diagnosis not present

## 2019-07-18 DIAGNOSIS — G500 Trigeminal neuralgia: Secondary | ICD-10-CM

## 2019-07-18 DIAGNOSIS — N529 Male erectile dysfunction, unspecified: Secondary | ICD-10-CM

## 2019-07-18 DIAGNOSIS — I10 Essential (primary) hypertension: Secondary | ICD-10-CM

## 2019-07-18 DIAGNOSIS — E1169 Type 2 diabetes mellitus with other specified complication: Secondary | ICD-10-CM

## 2019-07-18 DIAGNOSIS — E1129 Type 2 diabetes mellitus with other diabetic kidney complication: Secondary | ICD-10-CM

## 2019-07-18 DIAGNOSIS — M109 Gout, unspecified: Secondary | ICD-10-CM

## 2019-07-18 LAB — COMPREHENSIVE METABOLIC PANEL
ALT: 26 IU/L (ref 0–44)
AST: 22 IU/L (ref 0–40)
Albumin/Globulin Ratio: 2.1 (ref 1.2–2.2)
Albumin: 4.7 g/dL (ref 3.8–4.8)
Alkaline Phosphatase: 49 IU/L (ref 39–117)
BUN/Creatinine Ratio: 24 (ref 10–24)
BUN: 21 mg/dL (ref 8–27)
Bilirubin Total: 0.5 mg/dL (ref 0.0–1.2)
CO2: 21 mmol/L (ref 20–29)
Calcium: 9.8 mg/dL (ref 8.6–10.2)
Chloride: 100 mmol/L (ref 96–106)
Creatinine, Ser: 0.88 mg/dL (ref 0.76–1.27)
GFR calc Af Amer: 103 mL/min/{1.73_m2} (ref >59)
GFR calc non Af Amer: 89 mL/min/{1.73_m2} (ref >59)
Globulin, Total: 2.2 g/dL (ref 1.5–4.5)
Glucose: 134 mg/dL — ABNORMAL HIGH (ref 65–99)
Potassium: 5 mmol/L (ref 3.5–5.2)
Sodium: 138 mmol/L (ref 134–144)
Total Protein: 6.9 g/dL (ref 6.0–8.5)

## 2019-07-18 LAB — URINALYSIS, COMPLETE
Bilirubin, UA: NEGATIVE
Glucose, UA: NEGATIVE
Ketones, UA: NEGATIVE
Leukocytes,UA: NEGATIVE
Nitrite, UA: NEGATIVE
RBC, UA: NEGATIVE
Specific Gravity, UA: 1.023 (ref 1.005–1.030)
Urobilinogen, Ur: 0.2 mg/dL (ref 0.2–1.0)
pH, UA: 6.5 (ref 5.0–7.5)

## 2019-07-18 LAB — HEMOGLOBIN A1C
Est. average glucose Bld gHb Est-mCnc: 146 mg/dL
Hgb A1c MFr Bld: 6.7 % — ABNORMAL HIGH (ref 4.8–5.6)

## 2019-07-18 LAB — MICROALBUMIN / CREATININE URINE RATIO
Creatinine, Urine: 165.2 mg/dL
Microalb/Creat Ratio: 82 mg/g creat — ABNORMAL HIGH (ref 0–29)
Microalbumin, Urine: 135.6 ug/mL

## 2019-07-18 LAB — MICROSCOPIC EXAMINATION
Bacteria, UA: NONE SEEN
Casts: NONE SEEN /lpf
Epithelial Cells (non renal): NONE SEEN /hpf (ref 0–10)
RBC, Urine: NONE SEEN /hpf (ref 0–2)

## 2019-07-18 MED ORDER — METFORMIN HCL ER 500 MG PO TB24: 1000.0000 mg | ORAL_TABLET | Freq: Every day | ORAL | 1 refills | Status: DC

## 2019-07-25 ENCOUNTER — Encounter: Payer: Self-pay | Admitting: Family Medicine

## 2019-07-30 ENCOUNTER — Encounter: Payer: Self-pay | Admitting: Family Medicine

## 2019-07-30 DIAGNOSIS — N529 Male erectile dysfunction, unspecified: Secondary | ICD-10-CM

## 2019-07-30 MED ORDER — SILDENAFIL CITRATE 20 MG PO TABS: ORAL_TABLET | ORAL | 1 refills | Status: DC

## 2019-08-06 ENCOUNTER — Other Ambulatory Visit: Payer: Self-pay | Admitting: Family Medicine

## 2019-08-06 DIAGNOSIS — E1129 Type 2 diabetes mellitus with other diabetic kidney complication: Secondary | ICD-10-CM

## 2019-08-07 ENCOUNTER — Other Ambulatory Visit: Payer: Self-pay | Admitting: Family Medicine

## 2019-08-07 DIAGNOSIS — E782 Mixed hyperlipidemia: Secondary | ICD-10-CM

## 2019-08-07 DIAGNOSIS — E1169 Type 2 diabetes mellitus with other specified complication: Secondary | ICD-10-CM

## 2019-08-08 ENCOUNTER — Ambulatory Visit (INDEPENDENT_AMBULATORY_CARE_PROVIDER_SITE_OTHER): Payer: Medicare Other | Admitting: Family Medicine

## 2019-08-08 ENCOUNTER — Ambulatory Visit
Admission: RE | Admit: 2019-08-08 | Discharge: 2019-08-08 | Disposition: A | Discharge status: Home / Self Care | Payer: Medicare Other | Source: Ambulatory Visit | Attending: Family Medicine | Admitting: Family Medicine

## 2019-08-08 ENCOUNTER — Other Ambulatory Visit: Payer: Self-pay

## 2019-08-08 ENCOUNTER — Encounter: Payer: Self-pay | Admitting: Family Medicine

## 2019-08-08 VITALS — BP 140/82 | Ht 69.0 in | Wt 192.0 lb

## 2019-08-08 DIAGNOSIS — M79672 Pain in left foot: Secondary | ICD-10-CM | POA: Diagnosis not present

## 2019-08-08 NOTE — Progress Notes (Signed)
[facility] [address] [place], [place] [address] Phone: [phone] Fax: [phone]   Patient Name: Brett Johnson Date of Birth: 01-23-1951 Medical Record Number: [id number] Gender: male Date of Encounter: 08/08/2019  CC: Left heel pain  HPI: Pt is here c/o Left heel pain. Pain started A few months ago. No MOI or inciting event. He has a history of Achilles tendinitis on this side, but states it is on the outside of the heel.  He is able to get through the day pain free, but the pain will occasionally wake him up in the middle of the night.  He denies any swelling or radiating pain into his toes or up towards his knee.  There is no new change in activity, shoe wear, or terrain.  He describes the pain as shooting.  He denies any weight loss, night sweats, headaches, fevers.  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  Past Medical History:  Diagnosis Date  . Cancer ([facility])    Basal cell cancer; followed annually by dermatology/[facility].  . Diabetes ([facility])   . Glucose intolerance (impaired glucose tolerance)   . Gout 08/02/2012  . Herpes labialis   . Squamous cell carcinoma of skin 2017   right arm, removed by Dr. Martinique  . Trigeminal neuralgia of left side of face    since childhood. controlled by tegretol (has had it on both sides in the past)    Current Outpatient Medications on File Prior to Visit  Medication Sig Dispense Refill  . allopurinol (ZYLOPRIM) 300 MG tablet Take 1 tablet (300 mg total) by mouth daily. 30 tablet 0  . aspirin 81 MG tablet Take 81 mg by mouth daily.    Marland Kitchen atorvastatin (LIPITOR) 40 MG tablet Take 1 tablet (40 mg total) by mouth daily. 30 tablet 0  . Blood Glucose Monitoring Suppl (ONE TOUCH ULTRA 2) w/Device KIT 1 kit by Does not apply route 2 (two) times daily. 1 each 0  . carbamazepine (TEGRETOL XR) 100 MG 12 hr tablet TAKE 1 TABLET BY MOUTH EVERY DAY 30 tablet 3  .  carbamazepine (TEGRETOL) 100 MG chewable tablet CHEW 1 TABLET AS DIRECTED/AS NEEDED FOR TRIGEMINAL NEURALGIA FLARES. (Patient not taking: Reported on 07/18/2019) 30 tablet 0  . cholecalciferol (VITAMIN D) 1000 units tablet Take 1,000 Units by mouth daily.    Marland Kitchen CINNAMON PO Take by mouth daily.    . colchicine 0.6 MG tablet Take 2 tablets by mouth at onset of gout flare.  May repeat 1 tablet in 1 hour if needed (max 3 tabs/24 hr) (Patient not taking: Reported on 07/18/2019) 20 tablet 0  . diclofenac (VOLTAREN) 75 MG EC tablet TAKE 1 TABLET BY MOUTH TWICE A DAY AS NEEDED (Patient not taking: Reported on 07/18/2019) 60 tablet 0  . EPINEPHrine 0.3 mg/0.3 mL IJ SOAJ injection Inject 0.3 mLs (0.3 mg total) into the muscle as needed for anaphylaxis. (Patient not taking: Reported on 07/18/2019) 1 each 0  . fexofenadine (ALLEGRA) 180 MG tablet Take 180 mg by mouth daily.    Marland Kitchen glucose blood (ONE TOUCH ULTRA TEST) test strip Use as instructed 200 each 12  . Lancets (ONETOUCH ULTRASOFT) lancets Use as instructed 200 each 12  . losartan (COZAAR) 25 MG tablet TAKE 1 TABLET BY MOUTH EVERY DAY 90 tablet 0  . Melatonin 3 MG TABS Take 3 mg by mouth as needed.    . metFORMIN (GLUCOPHAGE-XR) 500 MG  24 hr tablet Take 2 tablets (1,000 mg total) by mouth daily with breakfast. 180 tablet 1  . MILK THISTLE PO Take by mouth daily.     . Multiple Vitamin (MULTIVITAMIN) tablet Take 1 tablet by mouth daily.    . naproxen sodium (ANAPROX) 220 MG tablet Take 220 mg by mouth 2 (two) times daily with a meal.    . nitroGLYCERIN (NITRODUR - DOSED IN MG/24 HR) 0.2 mg/hr patch Apply 1/4th patch to affected achilles, change daily 30 patch 1  . omega-3 acid ethyl esters (LOVAZA) 1 g capsule TAKE (2) CAPSULES BY MOUTH TWICE DAILY. 120 capsule 0  . sildenafil (REVATIO) 20 MG tablet Take 2-5 tablets once daily as needed for erectile dysfunction 50 tablet 1  . valACYclovir (VALTREX) 1000 MG tablet Take 2 tablets at onset of cold sore and  repeat once in 12 hours (4 tablets/course) (Patient not taking: Reported on 07/18/2019) 28 tablet 3   No current facility-administered medications on file prior to visit.    Past Surgical History:  Procedure Laterality Date  . COLONOSCOPY  11/01/2010   normal; repeat in 10 years.  [facility].    Allergies  Allergen Reactions  . Minocycline Anaphylaxis  . Penicillins Hives    Social History   Socioeconomic History  . Marital status: Married    Spouse name: Not on file  . Number of children: Not on file  . Years of education: Not on file  . Highest education level: Not on file  Occupational History  . Not on file  Tobacco Use  . Smoking status: Never Smoker  . Smokeless tobacco: Never Used  Substance and Sexual Activity  . Alcohol use: Yes    Alcohol/week: 14.0 standard drinks    Types: 14 Standard drinks or equivalent per week    Comment: 2 glasses of wine/night, with 1 cocktail prior to dinner a few times/week  . Drug use: No  . Sexual activity: Yes    Partners: Female  Other Topics Concern  . 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   Wife also retired spring 2018      Tobacco: none      Alcohol:  2-3 glasses of wine daily      Drugs: none      Exercising: walking 30-60 minutes daily.      Seatbelt: 100%      Guns:  Loaded partially secured; no children in house.      Sunscreen:  SPF 30-50.      Updated 07/2019   Social Determinants of Health   Financial Resource Strain:   . Difficulty of Paying Living Expenses: Not on file  Food Insecurity:   . Worried About Charity fundraiser in the Last Year: Not on file  . Ran Out of Food in the Last Year: Not on file  Transportation Needs:   . Lack of Transportation (Medical): Not  on file  . Lack of Transportation (Non-Medical): Not on file  Physical Activity:   . Days of Exercise per Week: Not on file  . Minutes of Exercise per Session: Not on file  Stress:   . Feeling of Stress : Not on file  Social Connections:   .  Frequency of Communication with Friends and Family: Not on file  . Frequency of Social Gatherings with Friends and Family: Not on file  . Attends Religious Services: Not on file  . Active Member of Clubs or Organizations: Not on file  . Attends Archivist Meetings: Not on file  . Marital Status: Not on file  Intimate Partner Violence:   . Fear of Current or Ex-Partner: Not on file  . Emotionally Abused: Not on file  . Physically Abused: Not on file  . Sexually Abused: Not on file    Family History  Problem Relation Age of Onset  . Colon polyps Father   . Diabetes Father   . Arthritis Father   . Heart disease Father        valve replacement.  . Hyperlipidemia Father   . Cancer Father        skin  . Parkinson's disease Father   . Pulmonary embolism Mother   . Heart disease Maternal Grandfather   . Diabetes Brother   . Hypertension Brother   . Hyperlipidemia Brother   . Cancer Brother        skin  . Kidney Stones Daughter     BP 140/82   Ht 5' 9" (1.753 m)   Wt 192 lb (87.1 kg)   BMI 28.35 kg/m   ROS:  See HPI CONST: no F/C, no malaise, no fatigue MSK: See above NEURO: no numbness/tingling, no weakness SKIN: no rash, no lesions HEME: no bleeding, no bruising, no erythema  Objective: GEN: Alert and oriented, NAD Pulm: Breathing unlabored PSY: normal mood, congruent affect  MSK Left foot Haglund's deformity Full ROM at ankle and toes Full strength at ankle and toes TTP on lateral heel half an inch distal from posterior edge of calcaneus Negative compression test Normal transverse and medial arch NVI  Right foot No swelling or erythema Full ROM at ankle and toes Full strength at ankle and toes No  TTP Negative compression test Normal transverse and medial arch NVI  Assessment and Plan:  1. Left Heel Pain  Given the fact that the tenderness is in an area not surrounded by much soft tissue in the setting of pain being worse at night, we will order a plain film of the heel to evaluate for possible stress reaction.  If the x-ray is inconclusive, it is reasonable to consider an MRI in the future versus placing patient in a boot.  Lanier Clam, DO, [facility] Sports Medicine Fellow

## 2019-08-13 ENCOUNTER — Encounter: Payer: Self-pay | Admitting: Family Medicine

## 2019-09-01 ENCOUNTER — Other Ambulatory Visit: Payer: Self-pay | Admitting: Family Medicine

## 2019-09-01 DIAGNOSIS — E1169 Type 2 diabetes mellitus with other specified complication: Secondary | ICD-10-CM

## 2019-09-04 ENCOUNTER — Encounter: Payer: Self-pay | Admitting: Family Medicine

## 2019-09-12 ENCOUNTER — Telehealth: Payer: Self-pay | Admitting: *Deleted

## 2019-09-12 ENCOUNTER — Other Ambulatory Visit: Payer: Medicare Other

## 2019-09-12 ENCOUNTER — Encounter: Payer: Self-pay | Admitting: Family Medicine

## 2019-09-12 ENCOUNTER — Other Ambulatory Visit: Payer: Self-pay

## 2019-09-12 ENCOUNTER — Encounter: Payer: Self-pay | Admitting: *Deleted

## 2019-09-12 NOTE — Telephone Encounter (Signed)
Older monitor is not accurate.  Newer one is also running a little bit high, borderline.

## 2019-09-12 NOTE — Telephone Encounter (Signed)
Patient was in for bp check, brought in 2 monitors. I got 142/80. 1st monitor (new one) read 152/80. 2nd monitor (older one that he will leave at the beach) read 168/98.

## 2019-09-19 DIAGNOSIS — E119 Type 2 diabetes mellitus without complications: Secondary | ICD-10-CM | POA: Diagnosis not present

## 2019-09-19 LAB — HM DIABETES EYE EXAM

## 2019-09-30 ENCOUNTER — Other Ambulatory Visit: Payer: Self-pay | Admitting: Family Medicine

## 2019-09-30 DIAGNOSIS — E1129 Type 2 diabetes mellitus with other diabetic kidney complication: Secondary | ICD-10-CM

## 2019-10-02 ENCOUNTER — Encounter: Payer: Self-pay | Admitting: Family Medicine

## 2019-10-02 DIAGNOSIS — E1169 Type 2 diabetes mellitus with other specified complication: Secondary | ICD-10-CM

## 2019-10-02 MED ORDER — OMEGA-3-ACID ETHYL ESTERS 1 G PO CAPS: ORAL_CAPSULE | ORAL | 1 refills | Status: DC

## 2019-10-11 ENCOUNTER — Other Ambulatory Visit: Payer: Self-pay | Admitting: Family Medicine

## 2019-10-11 DIAGNOSIS — G500 Trigeminal neuralgia: Secondary | ICD-10-CM

## 2019-10-16 ENCOUNTER — Encounter: Payer: Self-pay | Admitting: Family Medicine

## 2019-10-16 NOTE — Progress Notes (Signed)
Chief Complaint  Patient presents with  . Diabetes    nonfasting med check. No new concerns except he did mention that he has been sitting on a heating pad since the weekend for a muscle he pulled in his back-getting better.    Patient presents for 3 month follow-up without specific concerns.  He has gotten his COVID vaccine.  Follow up on diabetes. Metformin dose was increased to 1089m daily at his visit 6 months ago. He is taking 5035mtwice daily rather than taking them together. Tolerating without side effects. He has h/o microalbuminuria. Last eye exam 09/2019.  He checks his feet regularly. He previously noted that his toes feel a little funny, a little tingly--only notices when walking barefoot, not with shoes on. This is less noticeable than last time.  Blood Sugar  for last 7 days   Low 115...high 151...average 139       Last 14 days low 115...high 164...average 141     last 30 days low 115...high 171...average 144      last 90 days low 104...high 172.... average 144 He notes that the morning values are a little higher than when checked later in the day.  Lab Results  Component Value Date   HGBA1C 6.7 (H) 07/17/2019   He continues to have 2 glasses of wine most nights (splitting bottle with his wife, over dinner), with a cocktail before dinner a few nights/week.  Hyperlipidemia: Following a lowfat, low cholesterol diet. Tolerating atorvastatin and Lovaza without side effects, reports compliance. Lab Results  Component Value Date   CHOL 151 01/04/2019   HDL 55 01/04/2019   LDLCALC 60 01/04/2019   LDLDIRECT 126.5 02/18/2012   TRIG 178 (H) 01/04/2019   CHOLHDL 2.7 01/04/2019    Hypertension follow-up: BP's at last visit had been borderline on losartan 2533maily. He is tolerating losartan without side effects.Denies dizziness, headaches, chest pain.  He reports his monitor read higher than nurse's value (checked since last visit).  Blood pressure the last several days  132/69  130/76  126/72 128/75  123/73  130/67  122/75  129/77  124/72  110/71  126/74  125/66  123/76  122/70   ED:  Sildenafil was prescribed.  Started at 2 tablets, took a total of 5. Gets erect, but is smaller. Couldn't tell much difference. Change seems sudden (smaller size), and he notices a little curve. Denies any pain with erections. Denies poor erections or inability to maintain.  Gout--taking allopurinol.Denies any recent flares (prior flares had been in his toes, ankle).  H/o vitamin D deficiency, s/p rx therapy in the past.  Last check was 28 in 09/2017 (taking MVI 4x/week and ?dose of daily D). He is currently taking MVI daily and separate D 1000 IU daily.   PMH, PSH, SH reviewed  Outpatient Encounter Medications as of 10/17/2019  Medication Sig Note  . allopurinol (ZYLOPRIM) 300 MG tablet Take 1 tablet (300 mg total) by mouth daily.   . aMarland Kitchenpirin 81 MG tablet Take 81 mg by mouth daily. 01/06/2017: Occasional holds if he is having bleeding "springing leaks"  . atorvastatin (LIPITOR) 40 MG tablet Take 1 tablet (40 mg total) by mouth daily.   . Blood Glucose Monitoring Suppl (ONE TOUCH ULTRA 2) w/Device KIT 1 kit by Does not apply route 2 (two) times daily.   . carbamazepine (TEGRETOL XR) 100 MG 12 hr tablet TAKE 1 TABLET BY MOUTH EVERY DAY 10/17/2019: Takes every other day  . cholecalciferol (VITAMIN D) 1000  units tablet Take 1,000 Units by mouth daily.   Marland Kitchen CINNAMON PO Take by mouth daily.   Marland Kitchen glucose blood (ONE TOUCH ULTRA TEST) test strip Use as instructed   . Lancets (ONETOUCH ULTRASOFT) lancets Use as instructed   . losartan (COZAAR) 25 MG tablet TAKE 1 TABLET BY MOUTH EVERY DAY   . Melatonin 3 MG TABS Take 3 mg by mouth as needed. 01/06/2017: Takes occasionally  . metFORMIN (GLUCOPHAGE-XR) 500 MG 24 hr tablet Take 2 tablets (1,000 mg total) by mouth daily with breakfast. 10/17/2019: Taking 1 BID  . MILK THISTLE PO Take by mouth daily.    . Multiple Vitamin (MULTIVITAMIN) tablet  Take 1 tablet by mouth daily.   . naproxen sodium (ANAPROX) 220 MG tablet Take 220 mg by mouth 2 (two) times daily with a meal. 10/17/2019: Uses prn, using for his back this weekend  . omega-3 acid ethyl esters (LOVAZA) 1 g capsule TAKE (2) CAPSULES BY MOUTH TWICE DAILY.   . carbamazepine (TEGRETOL) 100 MG chewable tablet CHEW 1 TABLET AS DIRECTED/AS NEEDED FOR TRIGEMINAL NEURALGIA FLARES. (Patient not taking: Reported on 07/18/2019)   . colchicine 0.6 MG tablet Take 2 tablets by mouth at onset of gout flare.  May repeat 1 tablet in 1 hour if needed (max 3 tabs/24 hr) (Patient not taking: Reported on 07/18/2019)   . diclofenac (VOLTAREN) 75 MG EC tablet TAKE 1 TABLET BY MOUTH TWICE A DAY AS NEEDED (Patient not taking: Reported on 07/18/2019)   . EPINEPHrine 0.3 mg/0.3 mL IJ SOAJ injection Inject 0.3 mLs (0.3 mg total) into the muscle as needed for anaphylaxis. (Patient not taking: Reported on 07/18/2019)   . fexofenadine (ALLEGRA) 180 MG tablet Take 180 mg by mouth daily. 09/08/2017: Used for hives, resolved; uses prn allergies (rare)  . nitroGLYCERIN (NITRODUR - DOSED IN MG/24 HR) 0.2 mg/hr patch Apply 1/4th patch to affected achilles, change daily (Patient not taking: Reported on 10/17/2019)   . sildenafil (REVATIO) 20 MG tablet Take 2-5 tablets once daily as needed for erectile dysfunction (Patient not taking: Reported on 10/17/2019)   . valACYclovir (VALTREX) 1000 MG tablet Take 2 tablets at onset of cold sore and repeat once in 12 hours (4 tablets/course) (Patient not taking: Reported on 07/18/2019)   . [DISCONTINUED] carbamazepine (TEGRETOL XR) 100 MG 12 hr tablet TAKE 1 TABLET BY MOUTH EVERY DAY 07/18/2019: Takes it every other day   No facility-administered encounter medications on file as of 10/17/2019.   Allergies  Allergen Reactions  . Minocycline Anaphylaxis  . Penicillins Hives    ROS:  No fever, chills headaches, dizziness, chest pain, shortness of breath, swelling, URI symptoms, cough,    Low back pain last week--taking aleve and using heating pad. Denies sciatica  PHYSICAL EXAM:  BP 130/80   Pulse 68   Temp (!) 96.8 F (36 C) (Other (Comment))   Ht 5' 9"  (1.753 m)   Wt 200 lb 3.2 oz (90.8 kg)   BMI 29.56 kg/m   134/70 on repeat by MD  Wt Readings from Last 3 Encounters:  10/17/19 200 lb 3.2 oz (90.8 kg)  08/08/19 192 lb (87.1 kg)  07/18/19 195 lb 12.8 oz (88.8 kg)   Weight today with full pockets Well-appearing, pleasant male, in no distress HEENT: conjunctiva and sclera are clear, EOMI, wearing mask Neck: no lymphadenopathy or mass Heart: regular rate and rhythm Lungs: clear bilaterally Back: no spinal or CVA tenderness Abdomen: soft, nontender, no mass Extremities: No edema, normal pulses Psych:  normal mood, affect, hygiene and grooming Neuro: alert and oriented, normal gait.   Lab Results  Component Value Date   HGBA1C 6.3 (A) 10/17/2019     ASSESSMENT/PLAN:  Essential hypertension - controlled on current med. Encouraged further weight loss, low Na diet  Type 2 diabetes mellitus with microalbuminuria, without long-term current use of insulin (HCC) - well controlled on metformin. Encouraged further wt loss, and cutting back on alcohol. - Plan: HgB A1c  Vitamin D deficiency - continue daily supplement  Erectile dysfunction, unspecified erectile dysfunction type - not having true ED at this time, just change in erection, smaller size.  No need to continue sildenafil   F/u 3 months for 6 month med check with fasting labs prior c-met, lipids, urine microalb, PSA, CBC, uric acid, A1c, TSH

## 2019-10-17 ENCOUNTER — Other Ambulatory Visit: Payer: Self-pay

## 2019-10-17 ENCOUNTER — Encounter: Payer: Self-pay | Admitting: Family Medicine

## 2019-10-17 ENCOUNTER — Ambulatory Visit (INDEPENDENT_AMBULATORY_CARE_PROVIDER_SITE_OTHER): Payer: Medicare Other | Admitting: Family Medicine

## 2019-10-17 VITALS — BP 130/80 | HR 68 | Temp 96.8°F | Ht 69.0 in | Wt 200.2 lb

## 2019-10-17 DIAGNOSIS — E782 Mixed hyperlipidemia: Secondary | ICD-10-CM

## 2019-10-17 DIAGNOSIS — I10 Essential (primary) hypertension: Secondary | ICD-10-CM

## 2019-10-17 DIAGNOSIS — Z125 Encounter for screening for malignant neoplasm of prostate: Secondary | ICD-10-CM

## 2019-10-17 DIAGNOSIS — M109 Gout, unspecified: Secondary | ICD-10-CM

## 2019-10-17 DIAGNOSIS — Z5181 Encounter for therapeutic drug level monitoring: Secondary | ICD-10-CM

## 2019-10-17 DIAGNOSIS — N529 Male erectile dysfunction, unspecified: Secondary | ICD-10-CM | POA: Diagnosis not present

## 2019-10-17 DIAGNOSIS — E1129 Type 2 diabetes mellitus with other diabetic kidney complication: Secondary | ICD-10-CM | POA: Diagnosis not present

## 2019-10-17 DIAGNOSIS — E559 Vitamin D deficiency, unspecified: Secondary | ICD-10-CM

## 2019-10-17 DIAGNOSIS — R809 Proteinuria, unspecified: Secondary | ICD-10-CM

## 2019-10-17 DIAGNOSIS — E1169 Type 2 diabetes mellitus with other specified complication: Secondary | ICD-10-CM

## 2019-10-17 LAB — POCT GLYCOSYLATED HEMOGLOBIN (HGB A1C): Hemoglobin A1C: 6.3 % — AB (ref 4.0–5.6)

## 2019-11-22 ENCOUNTER — Encounter: Payer: Self-pay | Admitting: Family Medicine

## 2019-12-29 ENCOUNTER — Other Ambulatory Visit: Payer: Self-pay | Admitting: Family Medicine

## 2019-12-29 DIAGNOSIS — E1169 Type 2 diabetes mellitus with other specified complication: Secondary | ICD-10-CM

## 2019-12-29 DIAGNOSIS — E782 Mixed hyperlipidemia: Secondary | ICD-10-CM

## 2019-12-29 DIAGNOSIS — E790 Hyperuricemia without signs of inflammatory arthritis and tophaceous disease: Secondary | ICD-10-CM

## 2019-12-29 DIAGNOSIS — M109 Gout, unspecified: Secondary | ICD-10-CM

## 2020-01-06 ENCOUNTER — Other Ambulatory Visit: Payer: Self-pay | Admitting: Family Medicine

## 2020-01-06 DIAGNOSIS — E1129 Type 2 diabetes mellitus with other diabetic kidney complication: Secondary | ICD-10-CM

## 2020-01-06 DIAGNOSIS — G500 Trigeminal neuralgia: Secondary | ICD-10-CM

## 2020-01-07 ENCOUNTER — Other Ambulatory Visit: Payer: Self-pay | Admitting: Family Medicine

## 2020-01-07 DIAGNOSIS — E1129 Type 2 diabetes mellitus with other diabetic kidney complication: Secondary | ICD-10-CM

## 2020-01-07 NOTE — Telephone Encounter (Signed)
Is it ok to refill these medication pt. Last apt was 10/17/19 and is scheduled for next CPE on 08/28/19.

## 2020-03-11 ENCOUNTER — Encounter: Payer: Self-pay | Admitting: Family Medicine

## 2020-03-26 ENCOUNTER — Telehealth: Payer: Self-pay | Admitting: *Deleted

## 2020-03-26 ENCOUNTER — Other Ambulatory Visit: Payer: Self-pay

## 2020-03-26 ENCOUNTER — Other Ambulatory Visit: Payer: Medicare Other

## 2020-03-26 DIAGNOSIS — M109 Gout, unspecified: Secondary | ICD-10-CM | POA: Diagnosis not present

## 2020-03-26 DIAGNOSIS — E1169 Type 2 diabetes mellitus with other specified complication: Secondary | ICD-10-CM

## 2020-03-26 DIAGNOSIS — Z125 Encounter for screening for malignant neoplasm of prostate: Secondary | ICD-10-CM

## 2020-03-26 DIAGNOSIS — Z5181 Encounter for therapeutic drug level monitoring: Secondary | ICD-10-CM

## 2020-03-26 DIAGNOSIS — E1129 Type 2 diabetes mellitus with other diabetic kidney complication: Secondary | ICD-10-CM

## 2020-03-26 DIAGNOSIS — R809 Proteinuria, unspecified: Secondary | ICD-10-CM | POA: Diagnosis not present

## 2020-03-26 DIAGNOSIS — E782 Mixed hyperlipidemia: Secondary | ICD-10-CM | POA: Diagnosis not present

## 2020-03-26 NOTE — Telephone Encounter (Signed)
Has appt 8/30. Sugars--last week 87-106, average 98 Last 30d 87-124, average 104 Last 90d 87-193, average 125.  BP's [id number]. Mostly 120's/70's.

## 2020-03-26 NOTE — Telephone Encounter (Signed)
Patient dropped off bp list and info from [facility] put in your folder.

## 2020-03-27 ENCOUNTER — Ambulatory Visit: Payer: Medicare Other | Admitting: Family Medicine

## 2020-03-27 ENCOUNTER — Other Ambulatory Visit: Payer: Self-pay | Admitting: Family Medicine

## 2020-03-27 DIAGNOSIS — E790 Hyperuricemia without signs of inflammatory arthritis and tophaceous disease: Secondary | ICD-10-CM

## 2020-03-27 DIAGNOSIS — E1169 Type 2 diabetes mellitus with other specified complication: Secondary | ICD-10-CM

## 2020-03-27 DIAGNOSIS — M109 Gout, unspecified: Secondary | ICD-10-CM

## 2020-03-27 LAB — COMPREHENSIVE METABOLIC PANEL
ALT: 22 IU/L (ref 0–44)
AST: 17 IU/L (ref 0–40)
Albumin/Globulin Ratio: 2.1 (ref 1.2–2.2)
Albumin: 4.4 g/dL (ref 3.8–4.8)
Alkaline Phosphatase: 48 IU/L (ref 48–121)
BUN/Creatinine Ratio: 20 (ref 10–24)
BUN: 15 mg/dL (ref 8–27)
Bilirubin Total: 0.7 mg/dL (ref 0.0–1.2)
CO2: 24 mmol/L (ref 20–29)
Calcium: 9.5 mg/dL (ref 8.6–10.2)
Chloride: 100 mmol/L (ref 96–106)
Creatinine, Ser: 0.75 mg/dL — ABNORMAL LOW (ref 0.76–1.27)
GFR calc Af Amer: 109 mL/min/{1.73_m2} (ref >59)
GFR calc non Af Amer: 94 mL/min/{1.73_m2} (ref >59)
Globulin, Total: 2.1 g/dL (ref 1.5–4.5)
Glucose: 146 mg/dL — ABNORMAL HIGH (ref 65–99)
Potassium: 4.4 mmol/L (ref 3.5–5.2)
Sodium: 138 mmol/L (ref 134–144)
Total Protein: 6.5 g/dL (ref 6.0–8.5)

## 2020-03-27 LAB — CBC WITH DIFFERENTIAL/PLATELET
Basophils Absolute: 0 10*3/uL (ref 0.0–0.2)
Basos: 1 %
EOS (ABSOLUTE): 0.2 10*3/uL (ref 0.0–0.4)
Eos: 3 %
Hematocrit: 39.2 % (ref 37.5–51.0)
Hemoglobin: 13.3 g/dL (ref 13.0–17.7)
Immature Grans (Abs): 0 10*3/uL (ref 0.0–0.1)
Immature Granulocytes: 0 %
Lymphocytes Absolute: 1.8 10*3/uL (ref 0.7–3.1)
Lymphs: 28 %
MCH: 32.2 pg (ref 26.6–33.0)
MCHC: 33.9 g/dL (ref 31.5–35.7)
MCV: 95 fL (ref 79–97)
Monocytes Absolute: 0.8 10*3/uL (ref 0.1–0.9)
Monocytes: 12 %
Neutrophils Absolute: 3.7 10*3/uL (ref 1.4–7.0)
Neutrophils: 56 %
Platelets: 151 10*3/uL (ref 150–450)
RBC: 4.13 x10E6/uL — ABNORMAL LOW (ref 4.14–5.80)
RDW: 12.3 % (ref 11.6–15.4)
WBC: 6.6 10*3/uL (ref 3.4–10.8)

## 2020-03-27 LAB — LIPID PANEL
Chol/HDL Ratio: 1.8 ratio (ref 0.0–5.0)
Cholesterol, Total: 116 mg/dL (ref 100–199)
HDL: 64 mg/dL (ref >39)
LDL Chol Calc (NIH): 38 mg/dL (ref 0–99)
Triglycerides: 65 mg/dL (ref 0–149)
VLDL Cholesterol Cal: 14 mg/dL (ref 5–40)

## 2020-03-27 LAB — PSA: Prostate Specific Ag, Serum: 0.6 ng/mL (ref 0.0–4.0)

## 2020-03-27 LAB — HEMOGLOBIN A1C
Est. average glucose Bld gHb Est-mCnc: 148 mg/dL
Hgb A1c MFr Bld: 6.8 % — ABNORMAL HIGH (ref 4.8–5.6)

## 2020-03-27 LAB — TSH: TSH: 1.8 u[IU]/mL (ref 0.450–4.500)

## 2020-03-27 LAB — URIC ACID: Uric Acid: 4.1 mg/dL (ref 3.8–8.4)

## 2020-03-27 LAB — MICROALBUMIN / CREATININE URINE RATIO
Creatinine, Urine: 64.1 mg/dL
Microalb/Creat Ratio: 157 mg/g creat — ABNORMAL HIGH (ref 0–29)
Microalbumin, Urine: 100.6 ug/mL

## 2020-03-30 NOTE — Progress Notes (Signed)
Chief Complaint  Patient presents with  . Hypertension    nonfasting med check. Had same allergic reaction happen again last week, didn't have to use epipen though. Left hand has knuckle that is swollen, significantly better than it was-skin has peeled.   . Other    wants to get COVID booster but would like to have prior to 6month due to some plans he has and travel and "breakthrough" reasons.     Patient presents for med check.  He had labs done prior to his visit, see below.  8 days ago, made a gin and tonic, took a sip and started coughing.  Couldn't stop, breathing was faster, didn't feel right.  Laid in bed, then backs of his legs and groin started itching. Took Allegra, benadryl, used hydrocortisone cream, had welts on his legs and buttocks. Woke up and was better. Didn't need to use epi-pen. Had allergy testing in the 70's when he had anaphylaxis.  Thought aspirin at first, but then had allergy testing which showed no aspirin allergy. Only mild grass allergy.  Message 8/10--L 4th swollen knuckle. He had been in the water.  No known injury/trauma/sting/bite.  Wife saw slight black mark over the swollen knuckle. Hand was swollen as well, worse over the knuckle.  Swelling in hand resolved, skin over the knuckle peeled, still a little tender to touch. No pain with movement, denies decrease ROM.  Follow up on diabetes. He is taking Metformin ER 5048mtwice daily rather than taking them together. Tolerating without side effects. He has h/o microalbuminuria. Last eye exam 09/2019.  He checks his feet regularly.   Sugars with [employer] meter--last week 87-106, average 98 Last 30d 87-124, average 104 Last 90d 87-193, average 125.  Prescribed meter was running higher (140's) than the [employer] meter. He had used control yesterday for the [employer] meter and reports it was in range.  He continuesto have 2 glasses of wine most nights (splitting bottle with his wife, over dinner), with a cocktail  before dinner a few nights/week. This remains about the same.  Hyperlipidemia: Following a lowfat, low cholesterol diet. Toleratingatorvastatinand Lovazawithout side effects, reports compliance.  Hypertension follow-up: He is tolerating losartan without side effects.Denies dizziness, headaches, chest pain.  Per list dropped by last week: BP's 109-134/63-86.  Mostly 120's/70's. In the last few days 101-134/66-81 since last week.  Gout--taking allopurinol.Denies any recent flares (prior flares had been in his toes, ankle).   PMH, PSH, SH reviewed  Outpatient Encounter Medications as of 03/31/2020  Medication Sig Note  . allopurinol (ZYLOPRIM) 300 MG tablet TAKE 1 TABLET BY MOUTH EVERY DAY   . aspirin 81 MG tablet Take 81 mg by mouth daily. 01/06/2017: Occasional holds if he is having bleeding "springing leaks"  . atorvastatin (LIPITOR) 40 MG tablet TAKE 1 TABLET BY MOUTH EVERY DAY   . Blood Glucose Monitoring Suppl (ONE TOUCH ULTRA 2) w/Device KIT 1 kit by Does not apply route 2 (two) times daily.   . carbamazepine (TEGRETOL XR) 100 MG 12 hr tablet TAKE 1 TABLET BY MOUTH EVERY DAY 03/31/2020: Takes every other day  . cholecalciferol (VITAMIN D) 1000 units tablet Take 1,000 Units by mouth daily.   . Marland KitchenINNAMON PO Take by mouth daily.   . Marland Kitchenlucose blood (ONE TOUCH ULTRA TEST) test strip Use as instructed   . Lancets (ONETOUCH ULTRASOFT) lancets Use as instructed   . losartan (COZAAR) 25 MG tablet TAKE 1 TABLET BY MOUTH EVERY DAY   . metFORMIN (GLUCOPHAGE-XR) 500  MG 24 hr tablet TAKE 2 TABLETS BY MOUTH EVERY DAY WITH BREAKFAST 03/31/2020: Takes 1 in the morning, 1 in the evening (bedtime)  . MILK THISTLE PO Take by mouth daily.    . Multiple Vitamin (MULTIVITAMIN) tablet Take 1 tablet by mouth daily.   Marland Kitchen omega-3 acid ethyl esters (LOVAZA) 1 g capsule TAKE (2) CAPSULES BY MOUTH TWICE DAILY.   . carbamazepine (TEGRETOL) 100 MG chewable tablet CHEW 1 TABLET AS DIRECTED/AS NEEDED FOR  TRIGEMINAL NEURALGIA FLARES. (Patient not taking: Reported on 07/18/2019)   . colchicine 0.6 MG tablet Take 2 tablets by mouth at onset of gout flare.  May repeat 1 tablet in 1 hour if needed (max 3 tabs/24 hr) (Patient not taking: Reported on 07/18/2019)   . diclofenac (VOLTAREN) 75 MG EC tablet TAKE 1 TABLET BY MOUTH TWICE A DAY AS NEEDED (Patient not taking: Reported on 07/18/2019)   . EPINEPHrine 0.3 mg/0.3 mL IJ SOAJ injection Inject 0.3 mLs (0.3 mg total) into the muscle as needed for anaphylaxis. (Patient not taking: Reported on 07/18/2019)   . fexofenadine (ALLEGRA) 180 MG tablet Take 180 mg by mouth daily. (Patient not taking: Reported on 03/31/2020) 09/08/2017: Used for hives, resolved; uses prn allergies (rare)  . Melatonin 3 MG TABS Take 3 mg by mouth as needed. (Patient not taking: Reported on 03/31/2020) 01/06/2017: Takes occasionally  . naproxen sodium (ANAPROX) 220 MG tablet Take 220 mg by mouth 2 (two) times daily with a meal. (Patient not taking: Reported on 03/31/2020) 10/17/2019: Uses prn, using for his back this weekend  . nitroGLYCERIN (NITRODUR - DOSED IN MG/24 HR) 0.2 mg/hr patch Apply 1/4th patch to affected achilles, change daily (Patient not taking: Reported on 10/17/2019)   . sildenafil (REVATIO) 20 MG tablet Take 2-5 tablets once daily as needed for erectile dysfunction (Patient not taking: Reported on 10/17/2019)   . valACYclovir (VALTREX) 1000 MG tablet Take 2 tablets at onset of cold sore and repeat once in 12 hours (4 tablets/course) (Patient not taking: Reported on 07/18/2019)    No facility-administered encounter medications on file as of 03/31/2020.   Allergies  Allergen Reactions  . Minocycline Anaphylaxis  . Penicillins Hives    ROS:  No fever, chills, URI symptoms, shortness of breath, chest pain, GI or GU complaints. No headaches, dizziness. No bleeding, bruising, rash L 4th knuckle is a little tender still, improving. No other joint pains.   PHYSICAL EXAM:  BP  130/74   Pulse 76   Ht 5' 8"  (1.727 m)   Wt 192 lb 3.2 oz (87.2 kg)   BMI 29.22 kg/m   Wt Readings from Last 3 Encounters:  03/31/20 192 lb 3.2 oz (87.2 kg)  10/17/19 200 lb 3.2 oz (90.8 kg)  08/08/19 192 lb (87.1 kg)   (had full pockets in 10/2019)  Well-appearing, pleasant male, in no distress HEENT: conjunctiva and sclera are clear, EOMI, wearing mask Neck: no lymphadenopathy or mass, no bruit Heart: regular rate and rhythm Lungs: clear bilaterally Back: no spinal or CVA tenderness Abdomen: soft, nontender, no mass Extremities: No edema, normal pulses. 2.5 x 3cm area of STS over dorsum of L 4th MCP minmally tender. Some area of peeled skin visible at periphery, with pink/erythema where the skin peeled away.  No weeping/drainage.  FROM at MCP. Psych: normal mood, affect, hygiene and grooming Neuro: alert and oriented, normal gait.  Lab Results  Component Value Date   HGBA1C 6.8 (H) 03/26/2020     Chemistry  Component Value Date/Time   NA 138 03/26/2020 0902   K 4.4 03/26/2020 0902   CL 100 03/26/2020 0902   CO2 24 03/26/2020 0902   BUN 15 03/26/2020 0902   CREATININE 0.75 (L) 03/26/2020 0902   CREATININE 0.90 01/05/2017 0757      Component Value Date/Time   CALCIUM 9.5 03/26/2020 0902   ALKPHOS 48 03/26/2020 0902   AST 17 03/26/2020 0902   ALT 22 03/26/2020 0902   BILITOT 0.7 03/26/2020 0902     Fasting glucose 146  Lab Results  Component Value Date   CHOL 116 03/26/2020   HDL 64 03/26/2020   LDLCALC 38 03/26/2020   LDLDIRECT 126.5 02/18/2012   TRIG 65 03/26/2020   CHOLHDL 1.8 03/26/2020   Lab Results  Component Value Date   WBC 6.6 03/26/2020   HGB 13.3 03/26/2020   HCT 39.2 03/26/2020   MCV 95 03/26/2020   PLT 151 03/26/2020   Lab Results  Component Value Date   PSA1 0.6 03/26/2020   PSA1 0.7 01/04/2019   PSA1 0.7 09/08/2017   PSA 0.6 04/15/2016   PSA 0.66 11/13/2014   PSA 0.69 01/02/2014    Lab Results  Component Value Date    TSH 1.800 03/26/2020   Lab Results  Component Value Date   LABURIC 4.1 03/26/2020   Urine microalbumin/Cr 157 (high)   ASSESSMENT/PLAN:  Type 2 diabetes mellitus with microalbuminuria, without long-term current use of insulin (HCC) - A1c a little higher, higher microalbumin, with excellent BP's. Increase metformin to 1534m daily. Diet recs reviewed - Plan: losartan (COZAAR) 25 MG tablet, metFORMIN (GLUCOPHAGE-XR) 500 MG 24 hr tablet  Essential hypertension - well controlled  Mixed hyperlipidemia due to type 2 diabetes mellitus ([place] - at goal on current regimen - Plan: atorvastatin (LIPITOR) 40 MG tablet  Acute gout of hand, unspecified cause, unspecified laterality - current swelling not c/w gout--suspect sting/bite. resolving.  Cont allopurinol for prevention of gout - Plan: allopurinol (ZYLOPRIM) 300 MG tablet  Trigeminal neuralgia - stable on current regimen (takes qod, doubles up prn and uses short-acting with flares) - Plan: carbamazepine (TEGRETOL XR) 100 MG 12 hr tablet  Counseled re: current guidance regarding boosters. (doesn't meet criteria for booster now; unsure if recs will change, likely will be able to get a little early) Counseled regarding COVID safety precautions (as he has plans for a lot of travel, concern).  F/u as scheduled 08/2020 for AWV.  A1c and nonfasting c-met can be done at visit (doesn't need labs prior).

## 2020-03-31 ENCOUNTER — Encounter: Payer: Self-pay | Admitting: Family Medicine

## 2020-03-31 ENCOUNTER — Other Ambulatory Visit: Payer: Self-pay | Admitting: Family Medicine

## 2020-03-31 ENCOUNTER — Other Ambulatory Visit: Payer: Self-pay

## 2020-03-31 ENCOUNTER — Ambulatory Visit (INDEPENDENT_AMBULATORY_CARE_PROVIDER_SITE_OTHER): Payer: Medicare Other | Admitting: Family Medicine

## 2020-03-31 VITALS — BP 130/74 | HR 76 | Ht 68.0 in | Wt 192.2 lb

## 2020-03-31 DIAGNOSIS — R809 Proteinuria, unspecified: Secondary | ICD-10-CM | POA: Diagnosis not present

## 2020-03-31 DIAGNOSIS — E1169 Type 2 diabetes mellitus with other specified complication: Secondary | ICD-10-CM

## 2020-03-31 DIAGNOSIS — M109 Gout, unspecified: Secondary | ICD-10-CM

## 2020-03-31 DIAGNOSIS — E1129 Type 2 diabetes mellitus with other diabetic kidney complication: Secondary | ICD-10-CM

## 2020-03-31 DIAGNOSIS — G500 Trigeminal neuralgia: Secondary | ICD-10-CM

## 2020-03-31 DIAGNOSIS — I10 Essential (primary) hypertension: Secondary | ICD-10-CM

## 2020-03-31 DIAGNOSIS — E782 Mixed hyperlipidemia: Secondary | ICD-10-CM | POA: Diagnosis not present

## 2020-03-31 MED ORDER — LOSARTAN POTASSIUM 25 MG PO TABS: 25.0000 mg | ORAL_TABLET | Freq: Every day | ORAL | 3 refills | Status: DC

## 2020-03-31 MED ORDER — ATORVASTATIN CALCIUM 40 MG PO TABS: 40.0000 mg | ORAL_TABLET | Freq: Every day | ORAL | 3 refills | Status: DC

## 2020-03-31 MED ORDER — METFORMIN HCL ER 500 MG PO TB24: 1500.0000 mg | ORAL_TABLET | Freq: Every day | ORAL | 1 refills | Status: DC

## 2020-03-31 MED ORDER — CARBAMAZEPINE ER 100 MG PO TB12: 100.0000 mg | ORAL_TABLET | Freq: Every day | ORAL | 0 refills | Status: DC

## 2020-03-31 MED ORDER — ALLOPURINOL 300 MG PO TABS: 300.0000 mg | ORAL_TABLET | Freq: Every day | ORAL | 3 refills | Status: DC

## 2020-03-31 NOTE — Patient Instructions (Signed)
Increase your metformin to 3 tablets daily (1500mg ).  You can either take all 3 together, or take 2 together in the morning (or evening) and 1 in the evening (or morning--whichever is easiest). I do recommend taking your evening dose with dinner, rather than at bedtime.

## 2020-04-22 ENCOUNTER — Other Ambulatory Visit (INDEPENDENT_AMBULATORY_CARE_PROVIDER_SITE_OTHER): Payer: Medicare Other

## 2020-04-22 ENCOUNTER — Other Ambulatory Visit: Payer: Self-pay

## 2020-04-22 DIAGNOSIS — Z23 Encounter for immunization: Secondary | ICD-10-CM | POA: Diagnosis not present

## 2020-04-25 ENCOUNTER — Encounter: Payer: Self-pay | Admitting: Family Medicine

## 2020-04-28 ENCOUNTER — Other Ambulatory Visit: Payer: Self-pay

## 2020-04-28 ENCOUNTER — Ambulatory Visit (INDEPENDENT_AMBULATORY_CARE_PROVIDER_SITE_OTHER): Payer: Medicare Other

## 2020-04-28 DIAGNOSIS — Z23 Encounter for immunization: Secondary | ICD-10-CM | POA: Diagnosis not present

## 2020-05-06 ENCOUNTER — Other Ambulatory Visit: Payer: Self-pay | Admitting: Family Medicine

## 2020-05-06 DIAGNOSIS — E1169 Type 2 diabetes mellitus with other specified complication: Secondary | ICD-10-CM

## 2020-05-07 DIAGNOSIS — L570 Actinic keratosis: Secondary | ICD-10-CM | POA: Diagnosis not present

## 2020-05-07 DIAGNOSIS — L821 Other seborrheic keratosis: Secondary | ICD-10-CM | POA: Diagnosis not present

## 2020-05-07 DIAGNOSIS — Z85828 Personal history of other malignant neoplasm of skin: Secondary | ICD-10-CM | POA: Diagnosis not present

## 2020-05-07 DIAGNOSIS — D229 Melanocytic nevi, unspecified: Secondary | ICD-10-CM | POA: Diagnosis not present

## 2020-05-14 ENCOUNTER — Encounter: Payer: Self-pay | Admitting: Family Medicine

## 2020-06-10 ENCOUNTER — Encounter: Payer: Self-pay | Admitting: Family Medicine

## 2020-06-21 ENCOUNTER — Encounter: Payer: Self-pay | Admitting: Family Medicine

## 2020-07-04 DIAGNOSIS — L821 Other seborrheic keratosis: Secondary | ICD-10-CM | POA: Diagnosis not present

## 2020-07-04 DIAGNOSIS — Z85828 Personal history of other malignant neoplasm of skin: Secondary | ICD-10-CM | POA: Diagnosis not present

## 2020-08-19 ENCOUNTER — Other Ambulatory Visit: Payer: Self-pay | Admitting: Family Medicine

## 2020-08-19 DIAGNOSIS — E1169 Type 2 diabetes mellitus with other specified complication: Secondary | ICD-10-CM

## 2020-08-23 ENCOUNTER — Encounter: Payer: Self-pay | Admitting: Family Medicine

## 2020-08-26 ENCOUNTER — Telehealth: Payer: Self-pay | Admitting: Internal Medicine

## 2020-08-26 NOTE — Telephone Encounter (Signed)
Please advise pt he does NOT need to fast for his visit

## 2020-08-26 NOTE — Patient Instructions (Addendum)
°  HEALTH MAINTENANCE RECOMMENDATIONS:  It is recommended that you get at least 30 minutes of aerobic exercise at least 5 days/week (for weight loss, you may need as much as 60-90 minutes). This can be any activity that gets your heart rate up. This can be divided in 10-15 minute intervals if needed, but try and build up your endurance at least once a week.  Weight bearing exercise is also recommended twice weekly.  Eat a healthy diet with lots of vegetables, fruits and fiber.  "Colorful" foods have a lot of vitamins (ie green vegetables, tomatoes, red peppers, etc).  Limit sweet tea, regular sodas and alcoholic beverages, all of which has a lot of calories and sugar.  Up to 2 alcoholic drinks daily may be beneficial for men (unless trying to lose weight, watch sugars).  Drink a lot of water.  Sunscreen of at least SPF 30 should be used on all sun-exposed parts of the skin when outside between the hours of 10 am and 4 pm (not just when at beach or pool, but even with exercise, golf, tennis, and yard work!)  Use a sunscreen that says "broad spectrum" so it covers both UVA and UVB rays, and make sure to reapply every 1-2 hours.  Remember to change the batteries in your smoke detectors when changing your clock times in the spring and fall.  Carbon monoxide detectors are recommended for your home.  Use your seat belt every time you are in a car, and please drive safely and not be distracted with cell phones and texting while driving.    Mr. Papadakis , Thank you for taking time to come for your Medicare Wellness Visit. I appreciate your ongoing commitment to your health goals. Please review the following plan we discussed and let me know if I can assist you in the future.   This is a list of the screening recommended for you and due dates:  Health Maintenance  Topic Date Due   Complete foot exam   07/17/2020   Eye exam for diabetics  09/18/2020   Hemoglobin A1C  09/26/2020   COVID-19 Vaccine (4  - Booster for Pfizer series) 10/26/2020   Colon Cancer Screening  11/16/2020   Tetanus Vaccine  04/28/2026   Flu Shot  Completed    Hepatitis C: One time screening is recommended by Center for Disease Control  (CDC) for  adults born from [birth year] through 1964.   Completed   Pneumonia vaccines  Completed   Foot exam and A1c were done today.

## 2020-08-26 NOTE — Progress Notes (Signed)
Chief Complaint  Patient presents with   Medicare Wellness    Nonfasting AWV/CPE, no concerns.    Brett Johnson is a 70 y.o. male who presents for annual wellness visit and follow-up on chronic medical conditions.    Follow up on diabetes.Metformin dose was increased from 1047m daily (was taking BID) to 15029mdaily (1 in the morning, 2 at night) at his last visit in 03/2020.  He denies side effects, notes some gas at night. [facility] had a1c done in November, and was 6.3% (down from 6.8%, done here 03/26/20). He did some traveling over the holidays (with dietary changes). He has microalbuminuria, last ratio was elevated at 157 in 03/2020. Last eye exam 09/2019, scheduled for tomorrow.He checks his feet regularly. He continuesto have 2 glasses of wine most nights (splitting bottle with his wife, over dinner), with a cocktail before dinner a few nights/week. This remains about the same.  Blood sugar readings the last 30 days-- High 138 Low 115 Average 125 He admits sugars were higher in December (max of 150's).  Didn't check it while he was on vacation.  Hyperlipidemia: Following a lowfat, low cholesterol diet. Toleratingatorvastatinand Lovazawithout side effects, reports compliance.Lipids at goal on last check. Lab Results  Component Value Date   CHOL 116 03/26/2020   HDL 64 03/26/2020   LDLCALC 38 03/26/2020   LDLDIRECT 126.5 02/18/2012   TRIG 65 03/26/2020   CHOLHDL 1.8 03/26/2020   Hypertension follow-up: He is tolerating losartan without side effects.Denies dizziness, headaches, chest pain. Blood pressure readings per pt--The two highest were 133/61 and 133/75 ... The two lowest were 116/62 and 114/70 This morning it was 140/80's. 126/72 on repeat a few minutes later.  Gout--taking allopurinol.Denies any recent flares (prior flares had been in his toes, ankle). Can't recall the last  flare.  ED--prescribed sildenafil last year.  He takes 4-5 and it seems to be effective.  Doesn't like the lack of spontaneity.  Trigeminal neuralgia since childhood.Saw neuro in the past and put on tegretol. Weaned to every other day, which still controls it. If he develops symptoms, he increases the dose back up to daily and also uses short-acting. Flares are rare, maybe not even one in the last year.  Vitamin D deficiency--s/p 12 weeks of prescription therapy in the past.Last check was 28 in 09/2017 (when taking MVI 4x/week, and taking D daily, but unsure of dose). He is currently taking MVI daily and 1000 IU daily.   Immunization History  Administered Date(s) Administered   Fluad Quad(high Dose 65+) 04/16/2019, 04/22/2020   Influenza Split 05/11/2012   Influenza, High Dose Seasonal PF 04/13/2017, 05/02/2018   Influenza,inj,Quad PF,6+ Mos 04/23/2013, 05/06/2014, 05/12/2015, 04/28/2016   PFIZER(Purple Top)SARS-COV-2 Vaccination 08/22/2019, 09/12/2019, 04/28/2020   Pneumococcal Conjugate-13 04/28/2016   Pneumococcal Polysaccharide-23 09/08/2017   Td 04/28/2016   Tdap 05/02/2006   Zoster 03/05/2013   Zoster Recombinat (Shingrix) 01/06/2017, 04/13/2017   Last colonoscopy:11/2010, diverticulosis Last PSA Lab Results  Component Value Date   PSA1 0.6 03/26/2020   PSA1 0.7 01/04/2019   PSA1 0.7 09/08/2017   PSA 0.6 04/15/2016   PSA 0.66 11/13/2014   PSA 0.69 01/02/2014  Dentist:every 6 months Ophtho:yearly, scheduled for tomorrow Exercise:Walks daily 30-60 minutes, most days (weather-permitting, usually 45 mins).  Light weights and pushups occasionally at home. Swims in the summer only.  Other doctors caring for patient include: GI: Dr. MaLucio Edwardentist: Dr. AlSatira Sarkphtho:  Dr. Genevie Ann (at [facility]) Derm: [place] Ortho: Dr. Theda Sers Cardiologist: Dr. Tamala Julian Sports Med: Dr. Karlton Lemon (for achilles tendonitis) allergist and neuro many  years ago  Depression screen:  Negative Fall Screen: Negative Functional Status Survey: unremarkable Mini-Cog screen normal See full screens in epic  End of Life Discussion:  Patient has a living will and medical power of attorney  PMH, PSH, [place] and FH were reviewed and updated  Outpatient Encounter Medications as of 08/27/2020  Medication Sig Note   allopurinol (ZYLOPRIM) 300 MG tablet Take 1 tablet (300 mg total) by mouth daily.    aspirin 81 MG tablet Take 81 mg by mouth daily. 01/06/2017: Occasional holds if he is having bleeding "springing leaks"   atorvastatin (LIPITOR) 40 MG tablet Take 1 tablet (40 mg total) by mouth daily.    Blood Glucose Monitoring Suppl (ONE TOUCH ULTRA 2) w/Device KIT 1 kit by Does not apply route 2 (two) times daily.    carbamazepine (TEGRETOL XR) 100 MG 12 hr tablet Take 1 tablet (100 mg total) by mouth daily. 08/27/2020: Uses qod, daily if/when flaring   cholecalciferol (VITAMIN D) 1000 units tablet Take 1,000 Units by mouth daily.    CINNAMON PO Take by mouth daily.    glucose blood (ONE TOUCH ULTRA TEST) test strip Use as instructed    Lancets (ONETOUCH ULTRASOFT) lancets Use as instructed    losartan (COZAAR) 25 MG tablet Take 1 tablet (25 mg total) by mouth daily.    metFORMIN (GLUCOPHAGE-XR) 500 MG 24 hr tablet Take 3 tablets (1,500 mg total) by mouth daily with breakfast.    MILK THISTLE PO Take by mouth daily.     Multiple Vitamin (MULTIVITAMIN) tablet Take 1 tablet by mouth daily.    omega-3 acid ethyl esters (LOVAZA) 1 g capsule TAKE (2) CAPSULES BY MOUTH TWICE DAILY.    carbamazepine (TEGRETOL) 100 MG chewable tablet CHEW 1 TABLET AS DIRECTED/AS NEEDED FOR TRIGEMINAL NEURALGIA FLARES. (Patient not taking: Reported on 08/27/2020)    colchicine 0.6 MG tablet Take 2 tablets by mouth at onset of gout flare.  May repeat 1 tablet in 1 hour if needed (max 3 tabs/24 hr) (Patient not taking: No sig reported)    diclofenac (VOLTAREN) 75 MG EC  tablet TAKE 1 TABLET BY MOUTH TWICE A DAY AS NEEDED (Patient not taking: No sig reported) 08/27/2020: Uses prn gout flare   EPINEPHrine 0.3 mg/0.3 mL IJ SOAJ injection Inject 0.3 mLs (0.3 mg total) into the muscle as needed for anaphylaxis. (Patient not taking: No sig reported)    fexofenadine (ALLEGRA) 180 MG tablet Take 180 mg by mouth daily. (Patient not taking: No sig reported) 09/08/2017: Used for hives, resolved; uses prn allergies (rare)   Melatonin 3 MG TABS Take 3 mg by mouth as needed. (Patient not taking: No sig reported) 01/06/2017: Takes occasionally   naproxen sodium (ANAPROX) 220 MG tablet Take 220 mg by mouth 2 (two) times daily with a meal. (Patient not taking: No sig reported) 08/27/2020: Prn pain, rare   nitroGLYCERIN (NITRODUR - DOSED IN MG/24 HR) 0.2 mg/hr patch Apply 1/4th patch to affected achilles, change daily (Patient not taking: No sig reported)    sildenafil (REVATIO) 20 MG tablet Take 2-5 tablets once daily as needed for erectile dysfunction (Patient not taking: No sig reported) 08/27/2020: Uses prn, 4-5 tablets   valACYclovir (VALTREX) 1000 MG tablet Take 2 tablets at onset of cold sore and repeat once in 12 hours (4 tablets/course) (Patient not taking:  No sig reported)    No facility-administered encounter medications on file as of 08/27/2020.   Allergies  Allergen Reactions   Minocycline Anaphylaxis   Penicillins Hives    ROS:The patient denies anorexia, fever, weight changes, headaches, vision loss, decreased hearing, ear pain, chest pain, palpitations, dizziness, syncope, dyspnea on exertion, swelling, nausea, vomiting, diarrhea, constipation, abdominal pain, melena, hematochezia, indigestion/heartburn, hematuria, incontinence, nocturia, weakened urine stream, dysuria, genital lesions, joint pains, numbness, tingling (slight in feet, when barefoot, per HPI), weakness, suspicious skin lesions, depression, anxiety, abnormal bleeding/bruising, or enlarged lymph  nodes.  ED per HPI Achilles issue completely resolved (L).  Uses heel lift in his walking shoes. Up 2-3 times to void at night, unchanged. Some low back pain a week ago, resolved (sore getting out of bed).   PHYSICAL EXAM:  BP (!) 160/100    Pulse 68    Ht _0  (1.727 m)    Wt 194 lb 3.2 oz (88.1 kg)    BMI 29.53 kg/m   Repeat BP 140/80  Wt Readings from Last 3 Encounters:  08/27/20 194 lb 3.2 oz (88.1 kg)  03/31/20 192 lb 3.2 oz (87.2 kg)  10/17/19 200 lb 3.2 oz (90.8 kg)    General Appearance:  Alert, cooperative, no distress, appears stated age  Head:  Normocephalic, without obvious abnormality, atraumatic  Eyes:  PERRL, conjunctiva/corneas clear, EOM's intact, fundi benign  Ears:  Normal TM's and external ear canals  Nose: Not examined, wearing mask due to COVID-19 pandemic  Throat: Not examined, wearing mask due to COVID-19 pandemic  Neck: Supple, no lymphadenopathy; thyroid: no enlargement/tenderness/ nodules; no carotid bruit or JVD  Back:  Spine nontender, no curvature, ROM normal, no CVA tenderness  Lungs:  Clear to auscultation bilaterally without wheezes, rales orronchi; respirations unlabored  Chest Wall:  No tenderness or deformity  Heart:  Regular rate and rhythm, S1 and S2 normal, no murmur, rub or gallop. There is frequent ectopy (extra beats, pauses), but overall rhythm is regular.  Breast Exam:  No chest wall tenderness, masses or gynecomastia  Abdomen:  Soft, non-tender, nondistended, normoactive bowel sounds, no masses, no hepatosplenomegaly  Genitalia:  Normal male external genitalia without lesions. Testicles without masses. No inguinal hernias.  Rectal:  Normal sphincter tone, no masses or tenderness; guaiac negative stool. Prostate smooth, no nodules, not enlarged.  Extremities: No clubbing, cyanosis or edema.   Pulses: 2+ and symmetric all extremities  Skin: Skin color, texture, turgor normal, no rashes.  Occasional bruise   Lymph nodes: Cervical, supraclavicular, and axillary nodes normal  Neurologic: CNII-XII intact, normal strength, sensation and gait; reflexes 2+ and symmetric throughout. Normal monofilament exam  Psych: Normal mood, affect, hygiene and grooming.  Diabetic foot exam--normal  Lab Results  Component Value Date   HGBA1C 6.8 (A) 08/27/2020    ASSESSMENT/PLAN:  Medicare annual wellness visit, subsequent  Type 2 diabetes mellitus with microalbuminuria, without long-term current use of insulin ([place]) - bump in A1c likely related to holiday, travels. cont 1538m daily.  - Plan: HgB A1c, Comprehensive metabolic panel  Mixed hyperlipidemia due to type 2 diabetes mellitus ([place] - at goal on last check, cont lovaza and atorvastatin  Essential hypertension - some white coat component. BP controlled per home numbers, monitor verified as accurate - Plan: Comprehensive metabolic panel  Hypertension associated with diabetes ([place] - Plan: Comprehensive metabolic panel  Acute gout of hand, unspecified cause, unspecified laterality - cont allopurinol  Trigeminal neuralgia - controlled on current regimen  Vitamin D  deficiency - cont daily supplements - Plan: VITAMIN D 25 Hydroxy (Vit-D Deficiency, Fractures)  Erectile dysfunction, unspecified erectile dysfunction type - cont sildenafil  Medication monitoring encounter - Plan: Comprehensive metabolic panel, VITAMIN D 25 Hydroxy (Vit-D Deficiency, Fractures)  Senile purpura (HCC) - minimal today, but pt reported more recently   F/u 6 months with labs prior-- A1c, c-met, lipids, CBC, PSA, TSH, urine microalb, uric acid  Discussed PSA screening (risks/benefits), recommended at least 30 minutes of aerobic activity at least 5 days/week, weight-bearing exercise at least 2x/week; proper sunscreen use reviewed; healthy diet and alcohol recommendations (less than or equal to 2 drinks/day) reviewed;  regular seatbelt use; changing batteries in smoke detectors.. Immunization recommendations discussed--UTD, continue yearly high dose flu shots.Colonoscopy recommendations reviewed, due 10/2020.   Medicare Attestation I have personally reviewed: The patient's medical and social history Their use of alcohol, tobacco or illicit drugs Their current medications and supplements The patient's functional ability including ADLs,fall risks, home safety risks, cognitive, and hearing and visual impairment Diet and physical activities Evidence for depression or mood disorders  The patient's weight, height, BMI, and visual acuity have been recorded in the chart.  I have made referrals, counseling, and provided education to the patient based on review of the above and I have provided the patient with a written personalized care plan for preventive services.

## 2020-08-26 NOTE — Telephone Encounter (Signed)
Lvm advising pt KH 

## 2020-08-26 NOTE — Telephone Encounter (Signed)
Pt called and states he got a message on his vm that he needs to fasting tomorrow, he was under the impression that he was only going to get an A1c done at this visit. He had labs done back in august 2021. Please advise if pt needs to fast or not

## 2020-08-27 ENCOUNTER — Other Ambulatory Visit: Payer: Self-pay

## 2020-08-27 ENCOUNTER — Ambulatory Visit (INDEPENDENT_AMBULATORY_CARE_PROVIDER_SITE_OTHER): Payer: Medicare Other | Admitting: Family Medicine

## 2020-08-27 ENCOUNTER — Encounter: Payer: Self-pay | Admitting: Family Medicine

## 2020-08-27 VITALS — BP 140/80 | HR 68 | Ht 68.0 in | Wt 194.2 lb

## 2020-08-27 DIAGNOSIS — Z125 Encounter for screening for malignant neoplasm of prostate: Secondary | ICD-10-CM

## 2020-08-27 DIAGNOSIS — G500 Trigeminal neuralgia: Secondary | ICD-10-CM | POA: Diagnosis not present

## 2020-08-27 DIAGNOSIS — I10 Essential (primary) hypertension: Secondary | ICD-10-CM

## 2020-08-27 DIAGNOSIS — E1169 Type 2 diabetes mellitus with other specified complication: Secondary | ICD-10-CM | POA: Diagnosis not present

## 2020-08-27 DIAGNOSIS — E559 Vitamin D deficiency, unspecified: Secondary | ICD-10-CM

## 2020-08-27 DIAGNOSIS — D692 Other nonthrombocytopenic purpura: Secondary | ICD-10-CM

## 2020-08-27 DIAGNOSIS — E782 Mixed hyperlipidemia: Secondary | ICD-10-CM

## 2020-08-27 DIAGNOSIS — E1129 Type 2 diabetes mellitus with other diabetic kidney complication: Secondary | ICD-10-CM | POA: Diagnosis not present

## 2020-08-27 DIAGNOSIS — Z0000 Encounter for general adult medical examination without abnormal findings: Secondary | ICD-10-CM | POA: Diagnosis not present

## 2020-08-27 DIAGNOSIS — I152 Hypertension secondary to endocrine disorders: Secondary | ICD-10-CM

## 2020-08-27 DIAGNOSIS — N529 Male erectile dysfunction, unspecified: Secondary | ICD-10-CM

## 2020-08-27 DIAGNOSIS — M109 Gout, unspecified: Secondary | ICD-10-CM

## 2020-08-27 DIAGNOSIS — R809 Proteinuria, unspecified: Secondary | ICD-10-CM

## 2020-08-27 DIAGNOSIS — E1159 Type 2 diabetes mellitus with other circulatory complications: Secondary | ICD-10-CM

## 2020-08-27 DIAGNOSIS — Z5181 Encounter for therapeutic drug level monitoring: Secondary | ICD-10-CM

## 2020-08-27 LAB — POCT GLYCOSYLATED HEMOGLOBIN (HGB A1C): Hemoglobin A1C: 6.8 % — AB (ref 4.0–5.6)

## 2020-08-28 ENCOUNTER — Other Ambulatory Visit: Payer: Self-pay | Admitting: Family Medicine

## 2020-08-28 DIAGNOSIS — E113293 Type 2 diabetes mellitus with mild nonproliferative diabetic retinopathy without macular edema, bilateral: Secondary | ICD-10-CM | POA: Diagnosis not present

## 2020-08-28 DIAGNOSIS — D692 Other nonthrombocytopenic purpura: Secondary | ICD-10-CM | POA: Insufficient documentation

## 2020-08-28 DIAGNOSIS — G500 Trigeminal neuralgia: Secondary | ICD-10-CM

## 2020-08-28 LAB — COMPREHENSIVE METABOLIC PANEL
ALT: 28 IU/L (ref 0–44)
AST: 19 IU/L (ref 0–40)
Albumin/Globulin Ratio: 2.2 (ref 1.2–2.2)
Albumin: 5 g/dL — ABNORMAL HIGH (ref 3.8–4.8)
Alkaline Phosphatase: 51 IU/L (ref 44–121)
BUN/Creatinine Ratio: 27 — ABNORMAL HIGH (ref 10–24)
BUN: 22 mg/dL (ref 8–27)
Bilirubin Total: 0.4 mg/dL (ref 0.0–1.2)
CO2: 23 mmol/L (ref 20–29)
Calcium: 9.7 mg/dL (ref 8.6–10.2)
Chloride: 100 mmol/L (ref 96–106)
Creatinine, Ser: 0.83 mg/dL (ref 0.76–1.27)
GFR calc Af Amer: 105 mL/min/{1.73_m2} (ref >59)
GFR calc non Af Amer: 90 mL/min/{1.73_m2} (ref >59)
Globulin, Total: 2.3 g/dL (ref 1.5–4.5)
Glucose: 141 mg/dL — ABNORMAL HIGH (ref 65–99)
Potassium: 4.6 mmol/L (ref 3.5–5.2)
Sodium: 141 mmol/L (ref 134–144)
Total Protein: 7.3 g/dL (ref 6.0–8.5)

## 2020-08-28 LAB — HM DIABETES EYE EXAM

## 2020-08-28 LAB — VITAMIN D 25 HYDROXY (VIT D DEFICIENCY, FRACTURES): Vit D, 25-Hydroxy: 34 ng/mL (ref 30.0–100.0)

## 2020-09-02 ENCOUNTER — Other Ambulatory Visit: Payer: Self-pay | Admitting: Family Medicine

## 2020-09-02 DIAGNOSIS — R809 Proteinuria, unspecified: Secondary | ICD-10-CM

## 2020-09-02 DIAGNOSIS — E1129 Type 2 diabetes mellitus with other diabetic kidney complication: Secondary | ICD-10-CM

## 2020-09-08 ENCOUNTER — Encounter: Payer: Self-pay | Admitting: *Deleted

## 2020-10-30 ENCOUNTER — Encounter: Payer: Self-pay | Admitting: Family Medicine

## 2020-11-03 ENCOUNTER — Other Ambulatory Visit: Payer: Self-pay

## 2020-11-03 ENCOUNTER — Ambulatory Visit (INDEPENDENT_AMBULATORY_CARE_PROVIDER_SITE_OTHER): Payer: Medicare Other

## 2020-11-03 DIAGNOSIS — Z23 Encounter for immunization: Secondary | ICD-10-CM | POA: Diagnosis not present

## 2020-11-09 DIAGNOSIS — Z20822 Contact with and (suspected) exposure to covid-19: Secondary | ICD-10-CM | POA: Diagnosis not present

## 2020-11-27 ENCOUNTER — Encounter: Payer: Self-pay | Admitting: Gastroenterology

## 2020-12-04 ENCOUNTER — Other Ambulatory Visit: Payer: Self-pay | Admitting: Family Medicine

## 2020-12-04 DIAGNOSIS — E782 Mixed hyperlipidemia: Secondary | ICD-10-CM

## 2020-12-04 DIAGNOSIS — E1169 Type 2 diabetes mellitus with other specified complication: Secondary | ICD-10-CM

## 2020-12-05 NOTE — Telephone Encounter (Signed)
Has an appt in August 

## 2020-12-31 LAB — HEMOGLOBIN A1C: Hemoglobin A1C: 6.5

## 2021-01-03 ENCOUNTER — Other Ambulatory Visit: Payer: Self-pay | Admitting: Family Medicine

## 2021-01-03 DIAGNOSIS — R809 Proteinuria, unspecified: Secondary | ICD-10-CM

## 2021-01-03 DIAGNOSIS — E1129 Type 2 diabetes mellitus with other diabetic kidney complication: Secondary | ICD-10-CM

## 2021-01-15 ENCOUNTER — Encounter: Payer: Self-pay | Admitting: Family Medicine

## 2021-01-16 ENCOUNTER — Encounter: Payer: Self-pay | Admitting: Internal Medicine

## 2021-01-16 LAB — HEMOGLOBIN A1C: Hemoglobin A1C: 6.5

## 2021-01-29 ENCOUNTER — Other Ambulatory Visit: Payer: Self-pay

## 2021-01-29 ENCOUNTER — Ambulatory Visit (AMBULATORY_SURGERY_CENTER): Payer: Medicare Other | Admitting: *Deleted

## 2021-01-29 VITALS — Ht 68.5 in | Wt 185.0 lb

## 2021-01-29 DIAGNOSIS — Z1211 Encounter for screening for malignant neoplasm of colon: Secondary | ICD-10-CM

## 2021-01-29 MED ORDER — PLENVU 140 G PO SOLR: 1.0000 | ORAL | 0 refills | Status: DC

## 2021-01-29 MED ORDER — PEG 3350-KCL-NA BICARB-NACL 420 G PO SOLR: 4000.0000 mL | Freq: Once | ORAL | 0 refills | Status: AC

## 2021-01-29 NOTE — Progress Notes (Addendum)
No egg or soy allergy known to patient  No issues with past sedation with any surgeries or procedures Patient denies ever being told they had issues or difficulty with intubation  No FH of Malignant Hyperthermia No diet pills per patient No home 02 use per patient  No blood thinners per patient  Pt denies issues with constipation  No A fib or A flutter  EMMI video to pt or via [place] 19 guidelines implemented in [place] today with Pt and RN  Pt is fully vaccinated  for Covid     Due to the COVID-19 pandemic we are asking patients to follow certain guidelines.  Pt aware of COVID protocols and [facility] guidelines   Pt verified name, DOB, address and insurance during PV today. Pt mailed instruction packet to included paper to complete and mail back to [facility] with addressed and stamped envelope, Emmi video, copy of consent form to read and not return, and instructions.PV completed over the phone. Pt encouraged to call with questions or issues. My Chart instructions to pt as well    Pt called asking for a different prep- attempted to call pt- no answer- he sent a My Chart message-I did respond but no answer as of yet and I want to mail packet to make sure he gets in time-  I will send in Plenvu and new instructions and he can decide which prep he wants volume vs cost

## 2021-01-29 NOTE — Telephone Encounter (Signed)
Inbound call from pt stating that he is fine with having Plenvu. He stated that he is fine with paying $60 for the prep. Thank you.

## 2021-01-29 NOTE — Addendum Note (Signed)
Addended by: Steva Ready on: 01/29/2021 02:59 PM   Modules accepted: Orders

## 2021-02-12 ENCOUNTER — Ambulatory Visit (AMBULATORY_SURGERY_CENTER): Payer: Medicare Other | Admitting: Gastroenterology

## 2021-02-12 ENCOUNTER — Other Ambulatory Visit: Payer: Self-pay

## 2021-02-12 ENCOUNTER — Encounter: Payer: Self-pay | Admitting: Gastroenterology

## 2021-02-12 VITALS — BP 119/65 | HR 56 | Temp 97.8°F | Resp 20 | Ht 68.0 in | Wt 185.0 lb

## 2021-02-12 DIAGNOSIS — D123 Benign neoplasm of transverse colon: Secondary | ICD-10-CM

## 2021-02-12 DIAGNOSIS — Z1211 Encounter for screening for malignant neoplasm of colon: Secondary | ICD-10-CM

## 2021-02-12 MED ORDER — SODIUM CHLORIDE 0.9 % IV SOLN: 500.0000 mL | Freq: Once | INTRAVENOUS | Status: DC

## 2021-02-12 NOTE — Patient Instructions (Signed)
YOU HAD AN ENDOSCOPIC PROCEDURE TODAY AT THE [facility]:   Refer to the procedure report that was given to you for any specific questions about what was found during the examination.  If the procedure report does not answer your questions, please call your gastroenterologist to clarify.  If you requested that your care partner not be given the details of your procedure findings, then the procedure report has been included in a sealed envelope for you to review at your convenience later.  YOU SHOULD EXPECT: Some feelings of bloating in the abdomen. Passage of more gas than usual.  Walking can help get rid of the air that was put into your GI tract during the procedure and reduce the bloating. If you had a lower endoscopy (such as a colonoscopy or flexible sigmoidoscopy) you may notice spotting of blood in your stool or on the toilet paper. If you underwent a bowel prep for your procedure, you may not have a normal bowel movement for a few days.  Please Note:  You might notice some irritation and congestion in your nose or some drainage.  This is from the oxygen used during your procedure.  There is no need for concern and it should clear up in a day or so.  SYMPTOMS TO REPORT IMMEDIATELY:   Following lower endoscopy (colonoscopy or flexible sigmoidoscopy):  Excessive amounts of blood in the stool  Significant tenderness or worsening of abdominal pains  Swelling of the abdomen that is new, acute  Fever of 100F or higher   Following upper endoscopy (EGD)  Vomiting of blood or coffee ground material  New chest pain or pain under the shoulder blades  Painful or persistently difficult swallowing  New shortness of breath  Fever of 100F or higher  Black, tarry-looking stools  For urgent or emergent issues, a gastroenterologist can be reached at any hour by calling [phone]. Do not use MyChart messaging for urgent concerns.    DIET:  We do recommend a small meal at first, but  then you may proceed to your regular diet.  Drink plenty of fluids but you should avoid alcoholic beverages for 24 hours.  ACTIVITY:  You should plan to take it easy for the rest of today and you should NOT DRIVE or use heavy machinery until tomorrow (because of the sedation medicines used during the test).    FOLLOW UP: Our staff will call the number listed on your records 48-72 hours following your procedure to check on you and address any questions or concerns that you may have regarding the information given to you following your procedure. If we do not reach you, we will leave a message.  We will attempt to reach you two times.  During this call, we will ask if you have developed any symptoms of COVID 19. If you develop any symptoms (ie: fever, flu-like symptoms, shortness of breath, cough etc.) before then, please call [phone].  If you test positive for Covid 19 in the 2 weeks post procedure, please call and report this information to us.    If any biopsies were taken you will be contacted by phone or by letter within the next 1-3 weeks.  Please call us at [phone] if you have not heard about the biopsies in 3 weeks.    SIGNATURES/CONFIDENTIALITY: You and/or your care partner have signed paperwork which will be entered into your electronic medical record.  These signatures attest to the fact that that the information above on   your After Visit Summary has been reviewed and is understood.  Full responsibility of the confidentiality of this discharge information lies with you and/or your care-partner. 

## 2021-02-12 NOTE — Progress Notes (Signed)
pt tolerated well. VSS. awake and to recovery. Report given to RN.  

## 2021-02-12 NOTE — Progress Notes (Signed)
VS by CW. ?

## 2021-02-12 NOTE — Op Note (Signed)
[facility] Patient Name: Brett Johnson Procedure Date: 02/12/2021 9:02 AM MRN: [id number] Endoscopist: Ladene Artist , MD Age: 70 Referring MD:  Date of Birth: 07-08-1951 Gender: Male Account #: [id number] Procedure:                Colonoscopy Indications:              Screening for colorectal malignant neoplasm Medicines:                Monitored Anesthesia Care Procedure:                Pre-Anesthesia Assessment:                           - Prior to the procedure, a History and Physical                            was performed, and patient medications and                            allergies were reviewed. The patient's tolerance of                            previous anesthesia was also reviewed. The risks                            and benefits of the procedure and the sedation                            options and risks were discussed with the patient.                            All questions were answered, and informed consent                            was obtained. Prior Anticoagulants: The patient has                            taken no previous anticoagulant or antiplatelet                            agents. ASA Grade Assessment: II - A patient with                            mild systemic disease. After reviewing the risks                            and benefits, the patient was deemed in                            satisfactory condition to undergo the procedure.                           After obtaining informed consent, the colonoscope  was passed under direct vision. Throughout the                            procedure, the patient's blood pressure, pulse, and                            oxygen saturations were monitored continuously. The                            CF [id number] #[id number] was introduced through the anus                            and advanced to the the cecum, identified by                            appendiceal orifice  and ileocecal valve. The                            ileocecal valve, appendiceal orifice, and rectum                            were photographed. The quality of the bowel                            preparation was good. The colonoscopy was performed                            without difficulty. The patient tolerated the                            procedure well. Scope In: 9:15:50 AM Scope Out: 9:31:44 AM Scope Withdrawal Time: 0 hours 13 minutes 32 seconds  Total Procedure Duration: 0 hours 15 minutes 54 seconds  Findings:                 The perianal and digital rectal examinations were                            normal.                           Two sessile polyps were found in the transverse                            colon. The polyps were 7 mm in size. These polyps                            were removed with a cold snare. Resection and                            retrieval were complete.                           Scattered medium-mouthed diverticula were found in  the right colon. There was no evidence of                            diverticular bleeding.                           Multiple medium-mouthed diverticula were found in                            the left colon. There was evidence of diverticular                            spasm. There was no evidence of diverticular                            bleeding.                           Internal hemorrhoids were found during                            retroflexion. The hemorrhoids were small and Grade                            I (internal hemorrhoids that do not prolapse).                           The exam was otherwise without abnormality on                            direct and retroflexion views. Complications:            No immediate complications. Estimated blood loss:                            None. Estimated Blood Loss:     Estimated blood loss: none. Impression:               - Two 7 mm  polyps in the transverse colon, removed                            with a cold snare. Resected and retrieved.                           - Mild diverticulosis in the right colon.                           - Moderate diverticulosis in the left colon.                           - Internal hemorrhoids.                           - The examination was otherwise normal on direct  and retroflexion views. Recommendation:           - Repeat colonoscopy after studies are complete for                            surveillance based on pathology results.                           - Patient has a contact number available for                            emergencies. The signs and symptoms of potential                            delayed complications were discussed with the                            patient. Return to normal activities tomorrow.                            Written discharge instructions were provided to the                            patient.                           - High fiber diet.                           - Continue present medications.                           - Await pathology results. Ladene Artist, MD 02/12/2021 9:36:03 AM This report has been signed electronically.

## 2021-02-12 NOTE — Progress Notes (Signed)
Called to room to assist during endoscopic procedure.  Patient ID and intended procedure confirmed with present staff. Received instructions for my participation in the procedure from the performing physician.  

## 2021-02-16 ENCOUNTER — Telehealth: Payer: Self-pay

## 2021-02-16 ENCOUNTER — Telehealth: Payer: Self-pay | Admitting: *Deleted

## 2021-02-16 NOTE — Telephone Encounter (Signed)
Unable to reach: line busy

## 2021-02-16 NOTE — Telephone Encounter (Signed)
  Follow up Call-  Call back number 02/12/2021  Post procedure Call Back phone  # [phone]  Permission to leave phone message Yes  Some recent data might be hidden     Patient questions:  Do you have a fever, pain , or abdominal swelling? No. Pain Score  0 *  Have you tolerated food without any problems? Yes.    Have you been able to return to your normal activities? Yes.    Do you have any questions about your discharge instructions: Diet   No. Medications  No. Follow up visit  No.  Do you have questions or concerns about your Care? No.  Actions: * If pain score is 4 or above: No action needed, pain <4.   Have you developed a fever since your procedure? no  2.   Have you had an respiratory symptoms (SOB or cough) since your procedure? no  3.   Have you tested positive for COVID 19 since your procedure no  4.   Have you had any family members/close contacts diagnosed with the COVID 19 since your procedure?  no   If yes to any of these questions please route to Joylene John, RN and Joella Prince, RN

## 2021-02-23 ENCOUNTER — Other Ambulatory Visit: Payer: Self-pay | Admitting: Family Medicine

## 2021-02-23 ENCOUNTER — Encounter: Payer: Medicare Other | Admitting: Gastroenterology

## 2021-02-23 ENCOUNTER — Other Ambulatory Visit: Payer: Medicare Other

## 2021-02-23 DIAGNOSIS — G500 Trigeminal neuralgia: Secondary | ICD-10-CM

## 2021-02-25 ENCOUNTER — Encounter: Payer: Medicare Other | Admitting: Family Medicine

## 2021-02-25 ENCOUNTER — Encounter: Payer: Self-pay | Admitting: Gastroenterology

## 2021-02-27 ENCOUNTER — Other Ambulatory Visit: Payer: Medicare Other

## 2021-02-28 ENCOUNTER — Encounter: Payer: Self-pay | Admitting: Family Medicine

## 2021-02-28 DIAGNOSIS — K5790 Diverticulosis of intestine, part unspecified, without perforation or abscess without bleeding: Secondary | ICD-10-CM | POA: Insufficient documentation

## 2021-02-28 DIAGNOSIS — D126 Benign neoplasm of colon, unspecified: Secondary | ICD-10-CM | POA: Insufficient documentation

## 2021-03-09 NOTE — Telephone Encounter (Signed)
Attempted f/u call back.

## 2021-03-12 ENCOUNTER — Encounter: Payer: Self-pay | Admitting: Family Medicine

## 2021-03-13 ENCOUNTER — Other Ambulatory Visit: Payer: Self-pay | Admitting: Family Medicine

## 2021-03-13 DIAGNOSIS — E1169 Type 2 diabetes mellitus with other specified complication: Secondary | ICD-10-CM

## 2021-03-30 ENCOUNTER — Other Ambulatory Visit: Payer: Self-pay

## 2021-03-30 ENCOUNTER — Other Ambulatory Visit: Payer: Medicare Other

## 2021-03-30 DIAGNOSIS — E1129 Type 2 diabetes mellitus with other diabetic kidney complication: Secondary | ICD-10-CM | POA: Diagnosis not present

## 2021-03-30 DIAGNOSIS — I10 Essential (primary) hypertension: Secondary | ICD-10-CM | POA: Diagnosis not present

## 2021-03-30 DIAGNOSIS — E1169 Type 2 diabetes mellitus with other specified complication: Secondary | ICD-10-CM | POA: Diagnosis not present

## 2021-03-30 DIAGNOSIS — E782 Mixed hyperlipidemia: Secondary | ICD-10-CM | POA: Diagnosis not present

## 2021-03-30 DIAGNOSIS — M109 Gout, unspecified: Secondary | ICD-10-CM | POA: Diagnosis not present

## 2021-03-30 DIAGNOSIS — Z5181 Encounter for therapeutic drug level monitoring: Secondary | ICD-10-CM | POA: Diagnosis not present

## 2021-03-30 DIAGNOSIS — Z125 Encounter for screening for malignant neoplasm of prostate: Secondary | ICD-10-CM | POA: Diagnosis not present

## 2021-03-30 DIAGNOSIS — R809 Proteinuria, unspecified: Secondary | ICD-10-CM | POA: Diagnosis not present

## 2021-03-31 LAB — MICROALBUMIN / CREATININE URINE RATIO
Creatinine, Urine: 78.2 mg/dL
Microalb/Creat Ratio: 59 mg/g{creat} — ABNORMAL HIGH (ref 0–29)
Microalbumin, Urine: 46.1 ug/mL

## 2021-03-31 LAB — COMPREHENSIVE METABOLIC PANEL
ALT: 20 IU/L (ref 0–44)
AST: 14 IU/L (ref 0–40)
Albumin/Globulin Ratio: 2.2 (ref 1.2–2.2)
Albumin: 4.6 g/dL (ref 3.8–4.8)
Alkaline Phosphatase: 50 IU/L (ref 44–121)
BUN/Creatinine Ratio: 26 — ABNORMAL HIGH (ref 10–24)
BUN: 23 mg/dL (ref 8–27)
Bilirubin Total: 0.5 mg/dL (ref 0.0–1.2)
CO2: 22 mmol/L (ref 20–29)
Calcium: 9.6 mg/dL (ref 8.6–10.2)
Chloride: 102 mmol/L (ref 96–106)
Creatinine, Ser: 0.87 mg/dL (ref 0.76–1.27)
Globulin, Total: 2.1 g/dL (ref 1.5–4.5)
Glucose: 147 mg/dL — ABNORMAL HIGH (ref 65–99)
Potassium: 4.4 mmol/L (ref 3.5–5.2)
Sodium: 139 mmol/L (ref 134–144)
Total Protein: 6.7 g/dL (ref 6.0–8.5)
eGFR: 93 mL/min/{1.73_m2} (ref >59)

## 2021-03-31 LAB — TSH: TSH: 1.23 u[IU]/mL (ref 0.450–4.500)

## 2021-03-31 LAB — CBC WITH DIFFERENTIAL/PLATELET
Basophils Absolute: 0 10*3/uL (ref 0.0–0.2)
Basos: 1 %
EOS (ABSOLUTE): 0.2 10*3/uL (ref 0.0–0.4)
Eos: 3 %
Hematocrit: 42.4 % (ref 37.5–51.0)
Hemoglobin: 14.5 g/dL (ref 13.0–17.7)
Immature Grans (Abs): 0 10*3/uL (ref 0.0–0.1)
Immature Granulocytes: 0 %
Lymphocytes Absolute: 2.1 10*3/uL (ref 0.7–3.1)
Lymphs: 40 %
MCH: 32.4 pg (ref 26.6–33.0)
MCHC: 34.2 g/dL (ref 31.5–35.7)
MCV: 95 fL (ref 79–97)
Monocytes Absolute: 0.7 10*3/uL (ref 0.1–0.9)
Monocytes: 14 %
Neutrophils Absolute: 2.3 10*3/uL (ref 1.4–7.0)
Neutrophils: 42 %
Platelets: 184 10*3/uL (ref 150–450)
RBC: 4.48 x10E6/uL (ref 4.14–5.80)
RDW: 12 % (ref 11.6–15.4)
WBC: 5.4 10*3/uL (ref 3.4–10.8)

## 2021-03-31 LAB — PSA: Prostate Specific Ag, Serum: 0.8 ng/mL (ref 0.0–4.0)

## 2021-03-31 LAB — LIPID PANEL
Chol/HDL Ratio: 2.3 ratio (ref 0.0–5.0)
Cholesterol, Total: 130 mg/dL (ref 100–199)
HDL: 56 mg/dL (ref >39)
LDL Chol Calc (NIH): 49 mg/dL (ref 0–99)
Triglycerides: 150 mg/dL — ABNORMAL HIGH (ref 0–149)
VLDL Cholesterol Cal: 25 mg/dL (ref 5–40)

## 2021-03-31 LAB — HEMOGLOBIN A1C
Est. average glucose Bld gHb Est-mCnc: 148 mg/dL
Hgb A1c MFr Bld: 6.8 % — ABNORMAL HIGH (ref 4.8–5.6)

## 2021-03-31 LAB — URIC ACID: Uric Acid: 5.5 mg/dL (ref 3.8–8.4)

## 2021-04-01 ENCOUNTER — Encounter: Payer: Self-pay | Admitting: Family Medicine

## 2021-04-01 ENCOUNTER — Ambulatory Visit (INDEPENDENT_AMBULATORY_CARE_PROVIDER_SITE_OTHER): Payer: Medicare Other | Admitting: Family Medicine

## 2021-04-01 ENCOUNTER — Other Ambulatory Visit: Payer: Self-pay

## 2021-04-01 VITALS — BP 130/71 | HR 68 | Ht 68.0 in | Wt 189.4 lb

## 2021-04-01 DIAGNOSIS — E1159 Type 2 diabetes mellitus with other circulatory complications: Secondary | ICD-10-CM

## 2021-04-01 DIAGNOSIS — E1169 Type 2 diabetes mellitus with other specified complication: Secondary | ICD-10-CM

## 2021-04-01 DIAGNOSIS — I152 Hypertension secondary to endocrine disorders: Secondary | ICD-10-CM | POA: Diagnosis not present

## 2021-04-01 DIAGNOSIS — E1129 Type 2 diabetes mellitus with other diabetic kidney complication: Secondary | ICD-10-CM

## 2021-04-01 DIAGNOSIS — Z23 Encounter for immunization: Secondary | ICD-10-CM

## 2021-04-01 DIAGNOSIS — M109 Gout, unspecified: Secondary | ICD-10-CM

## 2021-04-01 DIAGNOSIS — R809 Proteinuria, unspecified: Secondary | ICD-10-CM | POA: Diagnosis not present

## 2021-04-01 DIAGNOSIS — E782 Mixed hyperlipidemia: Secondary | ICD-10-CM | POA: Diagnosis not present

## 2021-04-01 MED ORDER — LOSARTAN POTASSIUM 25 MG PO TABS: 25.0000 mg | ORAL_TABLET | Freq: Every day | ORAL | 3 refills | Status: DC

## 2021-04-01 MED ORDER — METFORMIN HCL ER 500 MG PO TB24: ORAL_TABLET | ORAL | 1 refills | Status: DC

## 2021-04-01 MED ORDER — ALLOPURINOL 300 MG PO TABS: 300.0000 mg | ORAL_TABLET | Freq: Every day | ORAL | 3 refills | Status: DC

## 2021-04-01 MED ORDER — OMEGA-3-ACID ETHYL ESTERS 1 G PO CAPS: ORAL_CAPSULE | ORAL | 3 refills | Status: DC

## 2021-04-01 MED ORDER — ATORVASTATIN CALCIUM 40 MG PO TABS: 40.0000 mg | ORAL_TABLET | Freq: Every day | ORAL | 3 refills | Status: DC

## 2021-04-01 NOTE — Progress Notes (Deleted)
  Subjective:     Patient ID: Brett Johnson, male   DOB: February 27, 1951, 70 y.o.   MRN: [id number]  HPI   Review of Systems     Objective:   Physical Exam     Assessment:     ***    Plan:     ***

## 2021-04-01 NOTE — Progress Notes (Signed)
Chief Complaint  Patient presents with   Diabetes    Nonfasting med check. No new concerns. Needs a 90 day rx on fish oil, 30 day was sent to [facility].    Patient presents for med check.  He had labs done prior to visit, see below.  Follow up on diabetes. He is compliant with taking Metformin 1526m daily (1 in the morning, 2 at night, takes before bed).  He denies side effects, notes some gas at night. He has microalbuminuria, last ratio was elevated at 157 in 03/2020. Last eye exam 08/2020, showed retinopathy. He checks his feet regularly.  He continues to have 2 glasses of wine most nights (splitting bottle with his wife, over dinner), with a cocktail before dinner a few nights/week. This remains about the same. He eats a late dinner, around 8pm.   Blood sugar readings weren't brought in today.  He can't recall the values, just being "a tad over range" per monitor, maybe 130's. Lower in the afternoons. Mornings are always higher than later in the day. A1c was 6.5 via his [place] in June.  Hyperlipidemia:  Following a lowfat, low cholesterol diet. Tolerating atorvastatin and Lovaza without side effects, reports compliance.  He cut back on Lovaza to 2-3/d instead of 4 for a couple of weeks earlier this month related to some bruising on his arm.  He is back up to 4 now.   Hypertension follow-up:  He is tolerating losartan without side effects. Denies dizziness, headaches, chest pain, edema. Blood pressure readings range from 106-130/69-76 in the last week.   Gout--taking allopurinol. Denies any recent flares (prior flares had been in his toes, ankle). Can't recall the last flare.    Trigeminal neuralgia since childhood. Saw neuro in the past and put on tegretol.  Weaned to every other day, which still controls it.  If he develops symptoms, he increases the dose back up to daily and also uses short-acting. Flares are rare, maybe not even one in the last year.   Vitamin D deficiency--s/p 12  weeks of prescription therapy in the past.  Last check was 34.0 in 08/2020, when taking MVI daily plus 1000 IU.  He is currently on same supplements    PMH, PSH, SH reviewed  Outpatient Encounter Medications as of 04/01/2021  Medication Sig Note   allopurinol (ZYLOPRIM) 300 MG tablet Take 1 tablet (300 mg total) by mouth daily.    atorvastatin (LIPITOR) 40 MG tablet Take 1 tablet (40 mg total) by mouth daily.    Blood Glucose Monitoring Suppl (ONE TOUCH ULTRA 2) w/Device KIT 1 kit by Does not apply route 2 (two) times daily.    carbamazepine (TEGRETOL XR) 100 MG 12 hr tablet TAKE 1 TABLET BY MOUTH EVERY DAY 04/01/2021: Takes every other day, only takes daily if he flares   carbamazepine (TEGRETOL) 100 MG chewable tablet CHEW 1 TABLET AS DIRECTED/AS NEEDED FOR TRIGEMINAL NEURALGIA FLARES.    cholecalciferol (VITAMIN D) 1000 units tablet Take 1,000 Units by mouth daily.    CINNAMON PO Take by mouth daily.    glucose blood (ONE TOUCH ULTRA TEST) test strip Use as instructed    Lancets (ONETOUCH ULTRASOFT) lancets Use as instructed    losartan (COZAAR) 25 MG tablet Take 1 tablet (25 mg total) by mouth daily.    metFORMIN (GLUCOPHAGE-XR) 500 MG 24 hr tablet TAKE 3 TABLETS (1,500 MG TOTAL) BY MOUTH DAILY WITH BREAKFAST. (Patient taking differently: 1 breakfast, two at night)    MILK  THISTLE PO Take by mouth daily.     Multiple Vitamin (MULTIVITAMIN) tablet Take 1 tablet by mouth daily.    omega-3 acid ethyl esters (LOVAZA) 1 g capsule TAKE TWO CAPSULES BY MOUTH TWICE DAILY    colchicine 0.6 MG tablet Take 2 tablets by mouth at onset of gout flare.  May repeat 1 tablet in 1 hour if needed (max 3 tabs/24 hr) (Patient not taking: No sig reported)    diclofenac (VOLTAREN) 75 MG EC tablet TAKE 1 TABLET BY MOUTH TWICE A DAY AS NEEDED (Patient not taking: No sig reported) 08/27/2020: Uses prn gout flare   EPINEPHrine 0.3 mg/0.3 mL IJ SOAJ injection Inject 0.3 mLs (0.3 mg total) into the muscle as needed for  anaphylaxis. (Patient not taking: No sig reported)    fexofenadine (ALLEGRA) 180 MG tablet Take 180 mg by mouth daily. (Patient not taking: No sig reported) 09/08/2017: Used for hives, resolved; uses prn allergies (rare)   Melatonin 3 MG TABS Take 3 mg by mouth as needed. (Patient not taking: Reported on 04/01/2021) 01/06/2017: Takes occasionally   naproxen sodium (ANAPROX) 220 MG tablet Take 220 mg by mouth 2 (two) times daily with a meal. (Patient not taking: No sig reported) 08/27/2020: Prn pain, rare   sildenafil (REVATIO) 20 MG tablet Take 2-5 tablets once daily as needed for erectile dysfunction (Patient not taking: No sig reported) 08/27/2020: Uses prn, 4-5 tablets   valACYclovir (VALTREX) 1000 MG tablet Take 2 tablets at onset of cold sore and repeat once in 12 hours (4 tablets/course) (Patient not taking: Reported on 04/01/2021)    No facility-administered encounter medications on file as of 04/01/2021.   Allergies  Allergen Reactions   Minocycline Anaphylaxis   Penicillins Hives    ROS: Denies fever, chills, URI symptoms, headaches, dizziness, shortness of breath, chest pain.  Denies nausea, vomiting, bowel changes, urinary complaints, bleeding, bruising, rash. No joint pains, moods are normal. See HPI    PHYSICAL EXAM:  BP 140/80   Pulse 68   Ht 5' 8"  (1.727 m)   Wt 189 lb 6.4 oz (85.9 kg)   BMI 28.80 kg/m   130/71 at home today  Wt Readings from Last 3 Encounters:  04/01/21 189 lb 6.4 oz (85.9 kg)  02/12/21 185 lb (83.9 kg)  01/29/21 185 lb (83.9 kg)   Well-developed, pleasant male, in no distress HEENT: conjunctiva and sclera are clear, EOMI, wearing mask Neck: no lymphadenopathy, thryomegaly or bruit Heart: regular rate and rhythm.  Some ectopy/skipped beats noted. No murmur Lungs: clear bilaterally Abdomen: soft, nontender, no mass Extremities: no edema Skin: normal turgor, no rash Psych: normal mood, affect, hygiene and grooming   Lab Results  Component Value  Date   HGBA1C 6.8 (H) 03/30/2021   Fasting glucose 147 Urine microalb/Cr ratio 59 (high, but improved from 157) PSA 0.8  Lab Results  Component Value Date   WBC 5.4 03/30/2021   HGB 14.5 03/30/2021   HCT 42.4 03/30/2021   MCV 95 03/30/2021   PLT 184 03/30/2021   Lab Results  Component Value Date   CHOL 130 03/30/2021   HDL 56 03/30/2021   LDLCALC 49 03/30/2021   LDLDIRECT 126.5 02/18/2012   TRIG 150 (H) 03/30/2021   CHOLHDL 2.3 03/30/2021   Lab Results  Component Value Date   TSH 1.230 03/30/2021   Lab Results  Component Value Date   LABURIC 5.5 03/30/2021     ASSESSMENT/PLAN:  Type 2 diabetes mellitus with microalbuminuria, without long-term current  use of insulin (HCC) - Overall controlled; morning sugars above goal, poss related to later eating, alcohol, timing of med. take metfromin prior to dinner. Cont current dose.  - Plan: losartan (COZAAR) 25 MG tablet, metFORMIN (GLUCOPHAGE-XR) 500 MG 24 hr tablet  Need for influenza vaccination - Plan: Flu Vaccine QUAD High Dose(Fluad)  Acute gout of hand, unspecified cause, unspecified laterality - cont allopurinol, uric acid at goal - Plan: allopurinol (ZYLOPRIM) 300 MG tablet  Mixed hyperlipidemia due to type 2 diabetes mellitus ([place]) - Cont statin and Lovaza.  TG borderline, had been taking less lovaza recently. Encouraged decrease in alcohol - Plan: atorvastatin (LIPITOR) 40 MG tablet, omega-3 acid ethyl esters (LOVAZA) 1 g capsule  Hypertension associated with diabetes ([place]) - BP borderline here, normal at home. Cont losartan  Microalbuminuria - persistent, but improved. Cont ARB. BP and DM are controlled.  Change evening metformin to prior to dinner, rather than at bedtime. Counseled re: alcohol, encouraged cutting back, and wt loss.  Recommended updated COVID vaccine when available.   F/u in 6 mos as scheduled for AWV. A1c at visit.

## 2021-04-01 NOTE — Patient Instructions (Addendum)
I recommend getting the new COVID vaccine (with better coverage for variants) when available in the near future.  Please change your metformin dosing--do not take it at bedtime.  Take it prior to your dinner (or at the beginning of the meal if that is easier for you to remember). I'd like for you to cut back on your alcohol, periodically check sugars at bedtime, as well as morning.

## 2021-04-19 ENCOUNTER — Encounter: Payer: Self-pay | Admitting: Family Medicine

## 2021-04-28 IMAGING — DX DG OS CALCIS 2+V*L*
2 series · 2 of 2 positions shown · non-contrast
Comparison: None.

CLINICAL DATA: Left heel pain for several months

EXAM:
LEFT OS CALCIS - 2+ VIEW

[dg os calcis left (1 of 2)]
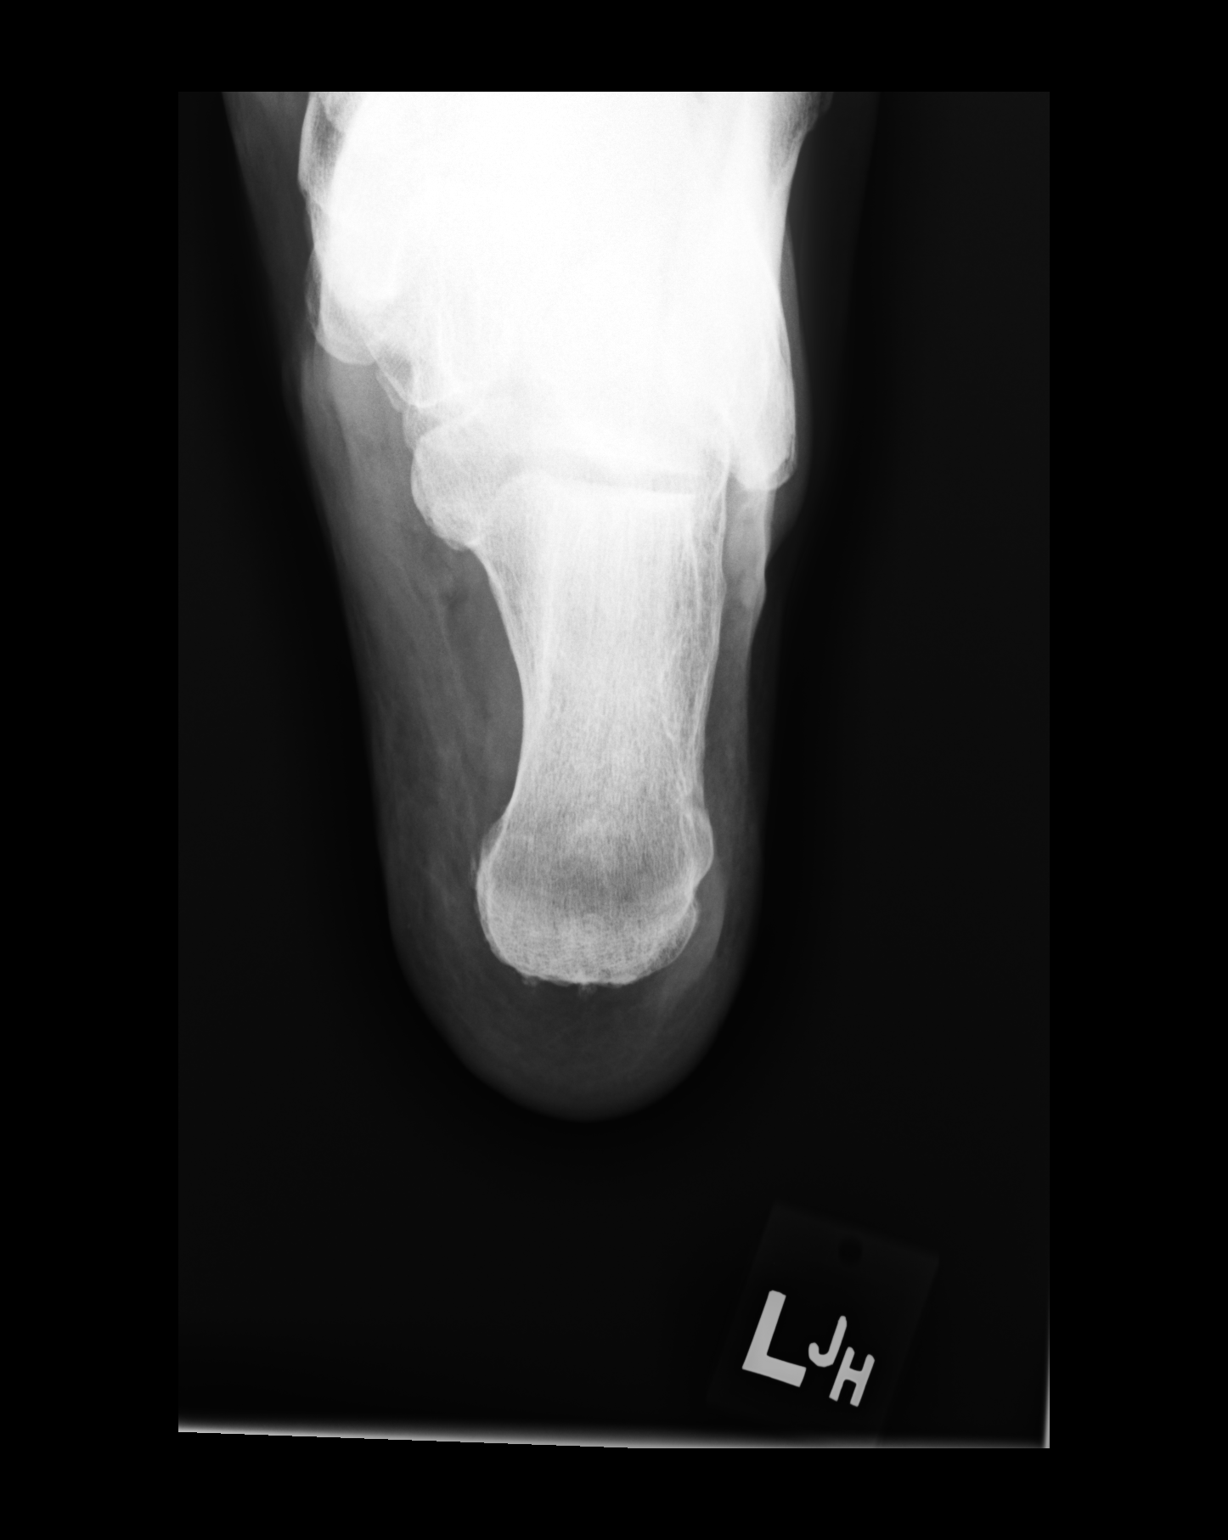

[dg os calcis left (2 of 2)]
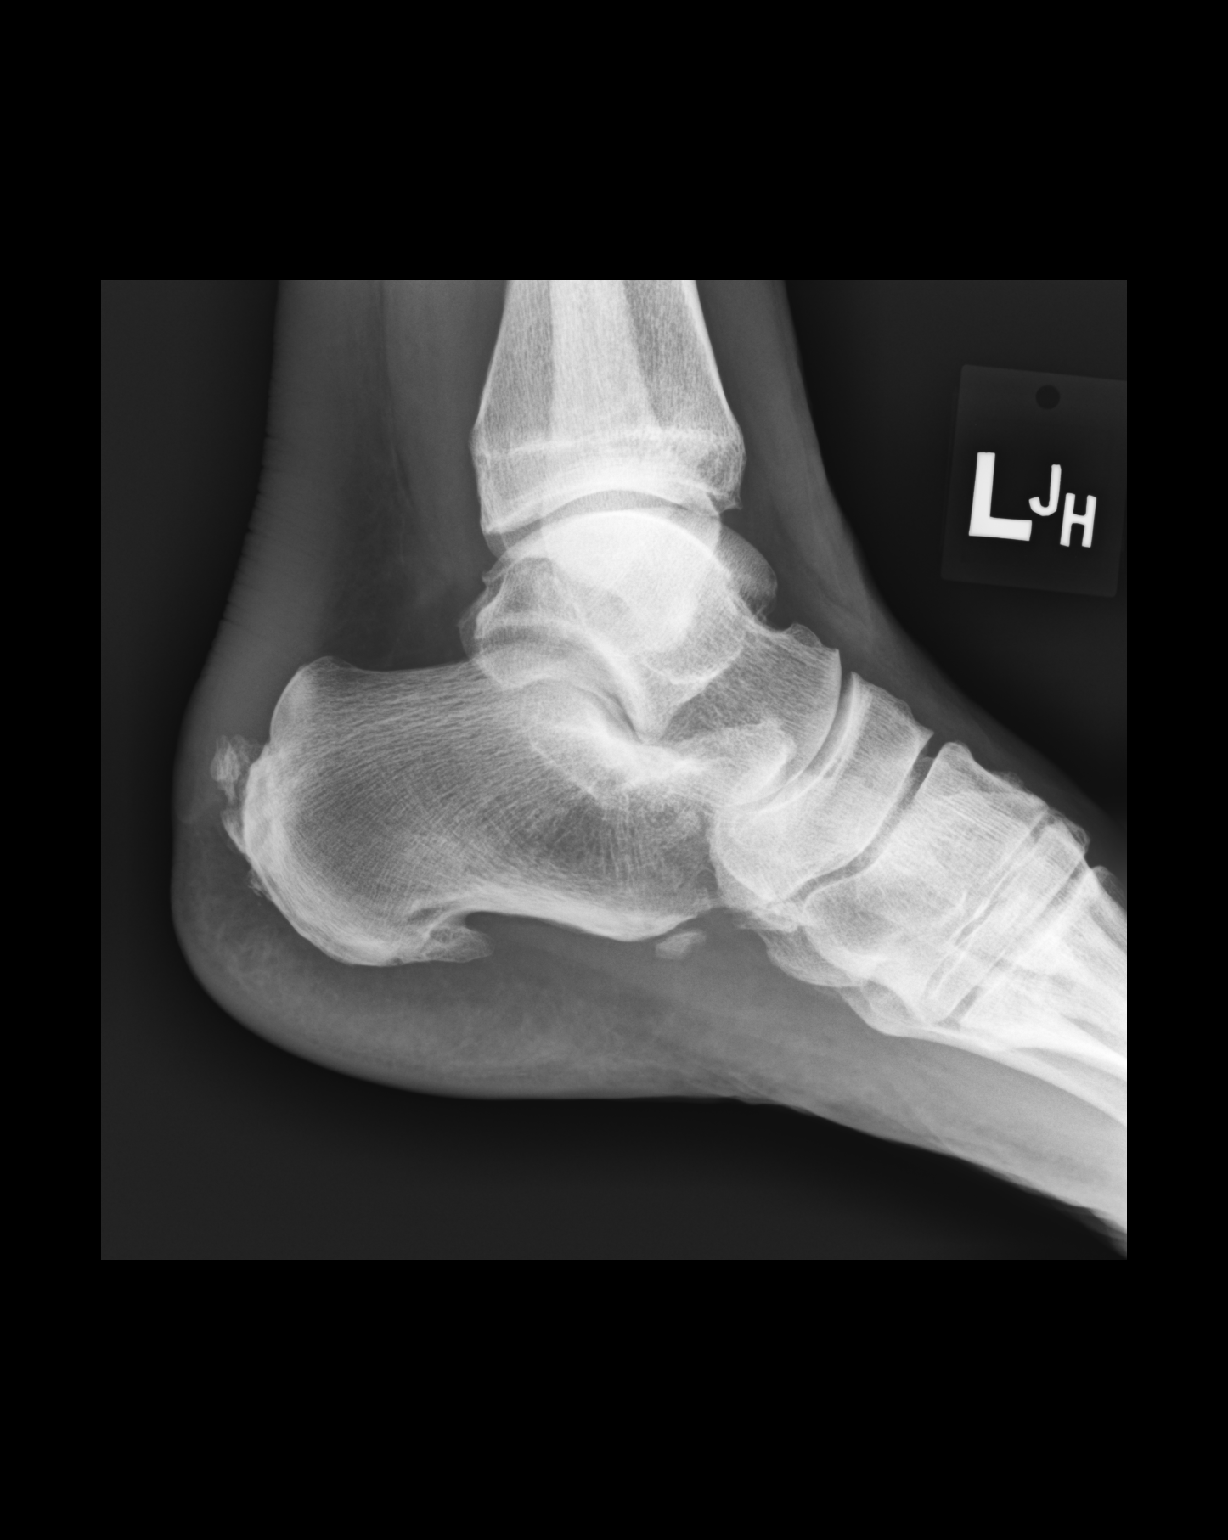

[2 of 2 positions shown; findings below may reference images not displayed]

FINDINGS: Posterior and plantar calcaneal spurs. No acute bony abnormality.
Specifically, no fracture, subluxation, or dislocation.
IMPRESSION: Calcaneal spurs.  No acute bony abnormality.

## 2021-05-05 DIAGNOSIS — D225 Melanocytic nevi of trunk: Secondary | ICD-10-CM | POA: Diagnosis not present

## 2021-05-05 DIAGNOSIS — D692 Other nonthrombocytopenic purpura: Secondary | ICD-10-CM | POA: Diagnosis not present

## 2021-05-05 DIAGNOSIS — L718 Other rosacea: Secondary | ICD-10-CM | POA: Diagnosis not present

## 2021-05-05 DIAGNOSIS — L821 Other seborrheic keratosis: Secondary | ICD-10-CM | POA: Diagnosis not present

## 2021-05-05 DIAGNOSIS — L570 Actinic keratosis: Secondary | ICD-10-CM | POA: Diagnosis not present

## 2021-05-05 DIAGNOSIS — D1801 Hemangioma of skin and subcutaneous tissue: Secondary | ICD-10-CM | POA: Diagnosis not present

## 2021-05-05 DIAGNOSIS — Z85828 Personal history of other malignant neoplasm of skin: Secondary | ICD-10-CM | POA: Diagnosis not present

## 2021-05-26 ENCOUNTER — Other Ambulatory Visit: Payer: Self-pay | Admitting: Family Medicine

## 2021-05-26 DIAGNOSIS — G500 Trigeminal neuralgia: Secondary | ICD-10-CM

## 2021-06-02 ENCOUNTER — Encounter: Payer: Self-pay | Admitting: Family Medicine

## 2021-06-10 ENCOUNTER — Encounter: Payer: Self-pay | Admitting: Family Medicine

## 2021-07-01 DIAGNOSIS — E119 Type 2 diabetes mellitus without complications: Secondary | ICD-10-CM | POA: Diagnosis not present

## 2021-07-01 DIAGNOSIS — H25813 Combined forms of age-related cataract, bilateral: Secondary | ICD-10-CM | POA: Diagnosis not present

## 2021-07-01 DIAGNOSIS — H43811 Vitreous degeneration, right eye: Secondary | ICD-10-CM | POA: Diagnosis not present

## 2021-07-01 DIAGNOSIS — H40013 Open angle with borderline findings, low risk, bilateral: Secondary | ICD-10-CM | POA: Diagnosis not present

## 2021-07-01 LAB — HM DIABETES EYE EXAM

## 2021-07-07 DIAGNOSIS — L718 Other rosacea: Secondary | ICD-10-CM | POA: Diagnosis not present

## 2021-07-07 DIAGNOSIS — L821 Other seborrheic keratosis: Secondary | ICD-10-CM | POA: Diagnosis not present

## 2021-07-07 DIAGNOSIS — D692 Other nonthrombocytopenic purpura: Secondary | ICD-10-CM | POA: Diagnosis not present

## 2021-07-07 DIAGNOSIS — Z85828 Personal history of other malignant neoplasm of skin: Secondary | ICD-10-CM | POA: Diagnosis not present

## 2021-07-21 ENCOUNTER — Other Ambulatory Visit: Payer: Self-pay | Admitting: Family Medicine

## 2021-07-21 DIAGNOSIS — R809 Proteinuria, unspecified: Secondary | ICD-10-CM

## 2021-07-21 NOTE — Telephone Encounter (Signed)
Spoke to pt has plenty of Metformin on hand. This is old prescription

## 2021-08-21 ENCOUNTER — Other Ambulatory Visit: Payer: Self-pay | Admitting: Family Medicine

## 2021-08-21 DIAGNOSIS — G500 Trigeminal neuralgia: Secondary | ICD-10-CM

## 2021-08-30 NOTE — Patient Instructions (Addendum)
°  HEALTH MAINTENANCE RECOMMENDATIONS:  It is recommended that you get at least 30 minutes of aerobic exercise at least 5 days/week (for weight loss, you may need as much as 60-90 minutes). This can be any activity that gets your heart rate up. This can be divided in 10-15 minute intervals if needed, but try and build up your endurance at least once a week.  Weight bearing exercise is also recommended twice weekly.  Eat a healthy diet with lots of vegetables, fruits and fiber.  "Colorful" foods have a lot of vitamins (ie green vegetables, tomatoes, red peppers, etc).  Limit sweet tea, regular sodas and alcoholic beverages, all of which has a lot of calories and sugar.  Up to 2 alcoholic drinks daily may be beneficial for men (unless trying to lose weight, watch sugars).  Drink a lot of water.  Sunscreen of at least SPF 30 should be used on all sun-exposed parts of the skin when outside between the hours of 10 am and 4 pm (not just when at beach or pool, but even with exercise, golf, tennis, and yard work!)  Use a sunscreen that says "broad spectrum" so it covers both UVA and UVB rays, and make sure to reapply every 1-2 hours.  Remember to change the batteries in your smoke detectors when changing your clock times in the spring and fall.  Carbon monoxide detectors are recommended for your home.  Use your seat belt every time you are in a car, and please drive safely and not be distracted with cell phones and texting while driving.    Brett Johnson , Thank you for taking time to come for your Medicare Wellness Visit. I appreciate your ongoing commitment to your health goals. Please review the following plan we discussed and let me know if I can assist you in the future.   This is a list of the screening recommended for you and due dates:  Health Maintenance  Topic Date Due   Complete foot exam   08/27/2021   Eye exam for diabetics  08/28/2021   Hemoglobin A1C  09/29/2021   Tetanus Vaccine   04/28/2026   Colon Cancer Screening  02/13/2028   Pneumonia Vaccine  Completed   Flu Shot  Completed   COVID-19 Vaccine  Completed   Hepatitis C Screening: USPSTF Recommendation to screen - Ages 18-79 yo.  Completed   Zoster (Shingles) Vaccine  Completed   HPV Vaccine  Aged Out   Ideally your metformin should be PRIOR to meals.

## 2021-08-30 NOTE — Progress Notes (Signed)
Chief Complaint  Patient presents with   Medicare Wellness    Nonfasting AWV. When he takes his BP at home, every once in a while, 20% of the time he gets an icon on his machine that indicates irregular heartbeat. He had eval with Dr. Midge Aver for cataracts, he said to hold off on surgery for now. He also did his diabetic eye exam at that time(I will get).    Brett Johnson is a 71 y.o. male who presents for annual wellness visit and follow-up on chronic medical conditions.    He reports he spoke to a clinical pharmacist through [facility], and he questioned whether or not he should restart the aspirin he stopped (related to having diabetes).  Follow up on diabetes. He is compliant with taking Metformin 1567m daily (1 in the morning, 2 before dinner--prior to his August visit he had been taking the evening dose at bedtime). He admits that he usually takes meformin after dinner rather than before, and sometimes between dinner and bedtime. He denies side effects, notes some gas at night, unchanged. He has microalbuminuria, last ratio was elevated at 59 in 03/2021, improved from 157 the year prior. Eye exam 08/2020, showed retinopathy. He subsequently had full exam from Dr. CMidge Aver11/2022 (and letter to his primary ophtho reportedly said no diabetic changes). Note not received here. He saw Dr. GKaty Fitchin November, not needing cataract surgery at this time.  "Saw something on one of the optic nerves", had a scan afterwards, not told anything wrong. He checks his feet regularly, denies concerns. Sometimes the bottoms of his feet feel a little different.  He continues to have 2 glasses of wine most nights (splitting bottle with his wife, over dinner, but last week, just 1 cocktail while she was out of town), with a cocktail before dinner a few nights/week. This remains about the same. He eats a late dinner, around 8pm.   Blood sugars are running a little higher in the mornings than before  lunch/dinner. He didn't bring in a list, just the average (which is from all times of day). Average 135, low 109, high 167. A1c was 6.64 via his [place] in 06/2021.  Hyperlipidemia:  Following a lowfat, low cholesterol diet. Tolerating atorvastatin and Lovaza without side effects, reports compliance.  At one point he cut back on Lovaza to 2-3d due to bruising (in August), but has been back on 4/day without recurrent issues.  Lab Results  Component Value Date   CHOL 130 03/30/2021   HDL 56 03/30/2021   LDLCALC 49 03/30/2021   LDLDIRECT 126.5 02/18/2012   TRIG 150 (H) 03/30/2021   CHOLHDL 2.3 03/30/2021   Hypertension follow-up:  He is tolerating losartan without side effects. Denies dizziness, headaches, chest pain, edema. Blood pressure has been running 98-130/66-83.  One low was 98/66, others all >114.  He checks it 3x (and records the 3rd reading). Value prior to the low one was 127/64. Today 3rd reading was 130/75.  Irregular heartbeat icon has shown occasionally.  He denies any palpitations or symptoms.  BP Readings from Last 3 Encounters:  08/31/21 140/80  04/01/21 130/71  02/12/21 119/65   Gout--taking allopurinol. Denies any recent flares (prior flares had been in his toes, ankle). Can't recall the last flare.    Trigeminal neuralgia since childhood. Saw neuro in the past and put on tegretol.  Weaned to every other day, which still controls it.  If he develops symptoms, he increases the dose back  up to daily and also uses short-acting tegretol. Flares are rare, maybe not even one in the last year. Can't recall when the last one was.   Vitamin D deficiency--s/p 12 weeks of prescription therapy in the past.  Last check was 34.0 in 08/2020, when taking MVI daily plus 1000 IU.  He is currently on same supplements   ED--Sildenafil was effective (at a dose of 4-5 tablets). Last Rx was from 2020. He didn't like the lack of spontanaity. We previously also discussed daily Cialis.  Today  he reports that he has been doing okay without use of sildenafil.   Immunization History  Administered Date(s) Administered   Fluad Quad(high Dose 65+) 04/16/2019, 04/22/2020, 04/01/2021   Influenza Split 05/11/2012   Influenza, High Dose Seasonal PF 04/13/2017, 05/02/2018   Influenza,inj,Quad PF,6+ Mos 04/23/2013, 05/06/2014, 05/12/2015, 04/28/2016   PFIZER Comirnaty(Gray Top)Covid-19 Tri-Sucrose Vaccine 11/03/2020   PFIZER(Purple Top)SARS-COV-2 Vaccination 08/22/2019, 09/12/2019, 04/28/2020   Pfizer Covid-19 Vaccine Bivalent Booster 37yr & up 04/26/2021   Pneumococcal Conjugate-13 04/28/2016   Pneumococcal Polysaccharide-23 09/08/2017   Td 04/28/2016   Tdap 05/02/2006   Zoster Recombinat (Shingrix) 01/06/2017, 04/13/2017   Zoster, Live 03/05/2013   Last colonoscopy: 01/2021 with Dr. SElvera Bicker(tubular adenomas), diverticulosis, internal hemorrhoids. 7 year f/u recommended Last PSA Lab Results  Component Value Date   PSA1 0.8 03/30/2021   PSA1 0.6 03/26/2020   PSA1 0.7 01/04/2019   PSA 0.6 04/15/2016   PSA 0.66 11/13/2014   PSA 0.69 01/02/2014  Dentist:every 6 months  Ophtho: yearly Exercise:  Walks daily 30-60 minutes, most days (weather-permitting, usually 45 mins).  Light weights and pushups occasionally at home. Swims in the summer only.  Patient Care Team: KRita Ohara MD as PCP - General (Family Medicine) GI: Dr. MLucio EdwardDentist: Dr. ASatira SarkOphtho: [facility]. Saw Dr. CMidge Averfor cataracts recently Derm: [facility] Ortho: Dr. CTheda SersCardiologist: Dr. STamala JulianSports Med: Dr. SKarlton Lemon(for achilles tendonitis) allergist and neuro many years ago  [facility] 08/31/2021 08/27/2020 03/31/2020 07/18/2019 01/10/2019  Decreased Interest 0 0 0 0 0  Down, Depressed, Hopeless 0 0 0 0 0  PHQ - 2 Score 0 0 0 0 0     Falls screen:  Fall Risk  08/31/2021 04/01/2021 08/27/2020 07/18/2019 01/10/2019  Falls in the past  year? 0 0 0 0 0  Number falls in past yr: 0 0 - - 0  Injury with Fall? 0 0 - - 0  Risk for fall due to : No Fall Risks No Fall Risks - - -  Follow up Falls evaluation completed Falls evaluation completed - - -     Functional Status Survey: Is the patient deaf or have difficulty hearing?: No Does the patient have difficulty seeing, even when wearing glasses/contacts?: No (does not yet need cataract surgery) Does the patient have difficulty concentrating, remembering, or making decisions?: No Does the patient have difficulty walking or climbing stairs?: No Does the patient have difficulty dressing or bathing?: No Does the patient have difficulty doing errands alone such as visiting a doctor's office or shopping?: No  Mini-Cog Scoring: 5     End of Life Discussion:  Patient has a living will and medical power of attorney   PMH, PSH, [place] FH were reviewed and updated   Outpatient Encounter Medications as of 08/31/2021  Medication Sig Note   allopurinol (ZYLOPRIM) 300 MG tablet Take 1 tablet (300 mg total) by mouth daily.  atorvastatin (LIPITOR) 40 MG tablet Take 1 tablet (40 mg total) by mouth daily.    Blood Glucose Monitoring Suppl (ONE TOUCH ULTRA 2) w/Device KIT 1 kit by Does not apply route 2 (two) times daily.    carbamazepine (TEGRETOL XR) 100 MG 12 hr tablet TAKE 1 TABLET BY MOUTH EVERY DAY    cholecalciferol (VITAMIN D) 1000 units tablet Take 1,000 Units by mouth daily.    CINNAMON PO Take by mouth daily.    glucose blood (ONE TOUCH ULTRA TEST) test strip Use as instructed    Lancets (ONETOUCH ULTRASOFT) lancets Use as instructed    losartan (COZAAR) 25 MG tablet Take 1 tablet (25 mg total) by mouth daily.    metFORMIN (GLUCOPHAGE-XR) 500 MG 24 hr tablet Take 1 tablet before breakfast, 2 tablets before dinner    MILK THISTLE PO Take by mouth daily.     Multiple Vitamin (MULTIVITAMIN) tablet Take 1 tablet by mouth daily.    omega-3 acid ethyl esters (LOVAZA) 1 g capsule  TAKE TWO CAPSULES BY MOUTH TWICE DAILY    carbamazepine (TEGRETOL) 100 MG chewable tablet CHEW 1 TABLET AS DIRECTED/AS NEEDED FOR TRIGEMINAL NEURALGIA FLARES. (Patient not taking: Reported on 08/31/2021)    colchicine 0.6 MG tablet Take 2 tablets by mouth at onset of gout flare.  May repeat 1 tablet in 1 hour if needed (max 3 tabs/24 hr) (Patient not taking: Reported on 01/29/2021) 08/31/2021: Has not needed   diclofenac (VOLTAREN) 75 MG EC tablet TAKE 1 TABLET BY MOUTH TWICE A DAY AS NEEDED (Patient not taking: **DO NOT CRUSH**) 08/27/2020: Uses prn gout flare   EPINEPHrine 0.3 mg/0.3 mL IJ SOAJ injection Inject 0.3 mLs (0.3 mg total) into the muscle as needed for anaphylaxis. (Patient not taking: Reported on 01/29/2021)    fexofenadine (ALLEGRA) 180 MG tablet Take 180 mg by mouth daily. (Patient not taking: Reported on 01/29/2021) 09/08/2017: Used for hives, resolved; uses prn allergies (rare)   Melatonin 3 MG TABS Take 3 mg by mouth as needed. (Patient not taking: Reported on 04/01/2021) 08/31/2021: As needed   naproxen sodium (ANAPROX) 220 MG tablet Take 220 mg by mouth 2 (two) times daily with a meal. (Patient not taking: Reported on 03/31/2020) 08/27/2020: Prn pain, rare   sildenafil (REVATIO) 20 MG tablet Take 2-5 tablets once daily as needed for erectile dysfunction (Patient not taking: Reported on 01/29/2021) 08/27/2020: Uses prn, 4-5 tablets   valACYclovir (VALTREX) 1000 MG tablet Take 2 tablets at onset of cold sore and repeat once in 12 hours (4 tablets/course) (Patient not taking: Reported on 04/01/2021)    No facility-administered encounter medications on file as of 08/31/2021.   Allergies  Allergen Reactions   Minocycline Anaphylaxis   Penicillins Hives    ROS: The patient denies anorexia, fever, weight changes, headaches,  vision loss, decreased hearing, ear pain, chest pain, palpitations, dizziness, syncope, dyspnea on exertion, swelling, nausea, vomiting, diarrhea, constipation, abdominal pain,  melena, hematochezia, indigestion/heartburn, hematuria, incontinence, nocturia, weakened urine stream, dysuria, genital lesions, joint pains, numbness, tingling (slight in feet, when barefoot, per HPI), weakness, suspicious skin lesions, depression, anxiety, abnormal bleeding/bruising, or enlarged lymph nodes.  ED improved, per HPI Up 2-3 times to void at night, unchanged.   PHYSICAL EXAM:  BP 140/80    Pulse 68    Ht _0  (1.727 m)    Wt 192 lb 6.4 oz (87.3 kg)    BMI 29.25 kg/m    Wt Readings from Last 3 Encounters:  08/31/21  192 lb 6.4 oz (87.3 kg)  04/01/21 189 lb 6.4 oz (85.9 kg)  02/12/21 185 lb (83.9 kg)    General Appearance:    Alert, cooperative, no distress, appears stated age  Head:    Normocephalic, without obvious abnormality, atraumatic  Eyes:    PERRL, conjunctiva/corneas clear, EOM's intact, fundi benign  Ears:    Normal TM's and external ear canals  Nose:   Not examined, wearing mask due to COVID-19 pandemic  Throat:   Not examined, wearing mask due to COVID-19 pandemic  Neck:   Supple, no lymphadenopathy;  thyroid:  no enlargement/tenderness/ nodules; no carotid bruit or JVD  Back:    Spine nontender, no curvature, ROM normal, no CVA tenderness  Lungs:     Clear to auscultation bilaterally without wheezes, rales or ronchi; respirations unlabored  Chest Wall:    No tenderness or deformity   Heart:    Regular rate and rhythm, S1 and S2 normal, no murmur, rub or gallop. No ectopy noted today, regular.  Breast Exam:    No chest wall tenderness, masses or gynecomastia  Abdomen:     Soft, non-tender, nondistended, normoactive bowel sounds, no masses, no hepatosplenomegaly  Genitalia:    Normal male external genitalia without lesions. Testicles without masses. No inguinal hernias.  Rectal:    Normal sphincter tone, no masses or tenderness; guaiac negative stool.  Prostate smooth, no nodules, not enlarged.  Extremities:   No clubbing, cyanosis or edema.   Pulses:   2+ and  symmetric all extremities  Skin:   Skin color, texture, turgor normal, no rashes. He has purpura on LUE. Lots of scrapes and scabs on lower legs.  Lymph nodes:   Cervical, supraclavicular, and inguinal nodes normal  Neurologic:   Normal strength, sensation and gait; reflexes 2+ and symmetric throughout. Normal monofilament exam                             Psych:   Normal mood, affect, hygiene and grooming.     Diabetic foot exam--normal  Lab Results  Component Value Date   HGBA1C 6.4 (A) 08/31/2021     ASSESSMENT/PLAN:  Medicare annual wellness visit, subsequent  Type 2 diabetes mellitus with microalbuminuria, without long-term current use of insulin ([place]) - well controlled; reviewed proper timing of metformin (with/prior to meal, not after). Wt loss and decreased alcohol recommended - Plan: Hemoglobin A1c, TSH, Microalbumin / creatinine urine ratio, Comprehensive metabolic panel, HgB W0J  Mixed hyperlipidemia due to type 2 diabetes mellitus (HCC) - cont statin and Lovaza, lowfat diet - Plan: Lipid panel  Hypertension associated with diabetes ([place]) - white coat HTN in office, normal at home.  monitor prev verified as accurate. Cont current meds, low Na diet  Microalbuminuria - Plan: Microalbumin / creatinine urine ratio  Trigeminal neuralgia - rare flares, doing well on tegretol qod  Vitamin D deficiency - continue current supplements  Erectile dysfunction, unspecified erectile dysfunction type - improved, no longer needing sildenafil. To contact us if worsens, or if prefers daily cialis  Screening for prostate cancer - normal prostate exam today. PSA due prior to next visit in 6 mos - Plan: PSA  Senile purpura ([place]) - noted on exam (NOT taking any aspirin)  Medication monitoring encounter - Plan: Hemoglobin A1c, CBC with Differential/Platelet, Lipid panel, Comprehensive metabolic panel, Uric acid  Acute gout of hand, unspecified cause, unspecified laterality - cont allopurinol  - Plan: Uric acid  Denies needing refills, thinks he has plenty of metformin at home.  No other RF needed either. Will check with pharmacist re: aspirin recs.  Encouraged cutting back on alcohol, weight loss.  F/u 6 months with labs prior-- A1c, c-met, lipids, CBC, PSA, TSH, urine microalb, uric acid   Discussed PSA screening (risks/benefits), recommended at least 30 minutes of aerobic activity at least 5 days/week, weight-bearing exercise at least 2x/week; proper sunscreen use reviewed; healthy diet and alcohol recommendations (less than or equal to 2 drinks/day) reviewed; regular seatbelt use; changing batteries in smoke detectors.. Immunization recommendations discussed--UTD, continue yearly high dose flu shots. Colonoscopy recommendations reviewed, due 01/2028  MOST form reviewed/updated. Full Code, Full Care.    Medicare Attestation I have personally reviewed: The patient's medical and social history Their use of alcohol, tobacco or illicit drugs Their current medications and supplements The patient's functional ability including ADLs,fall risks, home safety risks, cognitive, and hearing and visual impairment Diet and physical activities Evidence for depression or mood disorders  The patient's weight, height, BMI, and visual acuity have been recorded in the chart.  I have made referrals, counseling, and provided education to the patient based on review of the above and I have provided the patient with a written personalized care plan for preventive services.

## 2021-08-31 ENCOUNTER — Encounter: Payer: Self-pay | Admitting: Family Medicine

## 2021-08-31 ENCOUNTER — Ambulatory Visit (INDEPENDENT_AMBULATORY_CARE_PROVIDER_SITE_OTHER): Payer: Medicare Other | Admitting: Family Medicine

## 2021-08-31 ENCOUNTER — Encounter: Payer: Self-pay | Admitting: *Deleted

## 2021-08-31 ENCOUNTER — Other Ambulatory Visit: Payer: Self-pay

## 2021-08-31 VITALS — BP 130/75 | HR 68 | Ht 68.0 in | Wt 192.4 lb

## 2021-08-31 DIAGNOSIS — Z125 Encounter for screening for malignant neoplasm of prostate: Secondary | ICD-10-CM | POA: Diagnosis not present

## 2021-08-31 DIAGNOSIS — R809 Proteinuria, unspecified: Secondary | ICD-10-CM | POA: Diagnosis not present

## 2021-08-31 DIAGNOSIS — G500 Trigeminal neuralgia: Secondary | ICD-10-CM

## 2021-08-31 DIAGNOSIS — Z5181 Encounter for therapeutic drug level monitoring: Secondary | ICD-10-CM

## 2021-08-31 DIAGNOSIS — Z0000 Encounter for general adult medical examination without abnormal findings: Secondary | ICD-10-CM

## 2021-08-31 DIAGNOSIS — E1159 Type 2 diabetes mellitus with other circulatory complications: Secondary | ICD-10-CM | POA: Diagnosis not present

## 2021-08-31 DIAGNOSIS — E1169 Type 2 diabetes mellitus with other specified complication: Secondary | ICD-10-CM | POA: Diagnosis not present

## 2021-08-31 DIAGNOSIS — E1129 Type 2 diabetes mellitus with other diabetic kidney complication: Secondary | ICD-10-CM

## 2021-08-31 DIAGNOSIS — E782 Mixed hyperlipidemia: Secondary | ICD-10-CM

## 2021-08-31 DIAGNOSIS — D692 Other nonthrombocytopenic purpura: Secondary | ICD-10-CM

## 2021-08-31 DIAGNOSIS — E559 Vitamin D deficiency, unspecified: Secondary | ICD-10-CM | POA: Diagnosis not present

## 2021-08-31 DIAGNOSIS — I152 Hypertension secondary to endocrine disorders: Secondary | ICD-10-CM

## 2021-08-31 DIAGNOSIS — N529 Male erectile dysfunction, unspecified: Secondary | ICD-10-CM

## 2021-08-31 DIAGNOSIS — M109 Gout, unspecified: Secondary | ICD-10-CM

## 2021-08-31 LAB — POCT GLYCOSYLATED HEMOGLOBIN (HGB A1C): Hemoglobin A1C: 6.4 % — AB (ref 4.0–5.6)

## 2021-09-01 ENCOUNTER — Encounter: Payer: Self-pay | Admitting: Family Medicine

## 2021-10-15 ENCOUNTER — Encounter: Payer: Self-pay | Admitting: Family Medicine

## 2021-10-23 ENCOUNTER — Encounter: Payer: Self-pay | Admitting: Family Medicine

## 2021-10-23 DIAGNOSIS — E1129 Type 2 diabetes mellitus with other diabetic kidney complication: Secondary | ICD-10-CM

## 2021-10-23 MED ORDER — METFORMIN HCL ER 500 MG PO TB24: ORAL_TABLET | ORAL | 1 refills | Status: DC

## 2021-10-29 ENCOUNTER — Encounter: Payer: Self-pay | Admitting: *Deleted

## 2021-10-30 ENCOUNTER — Encounter: Payer: Self-pay | Admitting: Family Medicine

## 2021-12-01 ENCOUNTER — Encounter: Payer: Self-pay | Admitting: Family Medicine

## 2021-12-27 ENCOUNTER — Encounter: Payer: Self-pay | Admitting: Family Medicine

## 2022-01-13 ENCOUNTER — Encounter: Payer: Self-pay | Admitting: Family Medicine

## 2022-03-04 ENCOUNTER — Encounter: Payer: Self-pay | Admitting: Family Medicine

## 2022-03-04 ENCOUNTER — Telehealth (INDEPENDENT_AMBULATORY_CARE_PROVIDER_SITE_OTHER): Payer: Medicare Other | Admitting: Medical

## 2022-03-04 VITALS — Wt 185.0 lb

## 2022-03-04 DIAGNOSIS — U071 COVID-19: Secondary | ICD-10-CM

## 2022-03-04 DIAGNOSIS — R519 Headache, unspecified: Secondary | ICD-10-CM

## 2022-03-04 MED ORDER — MOLNUPIRAVIR EUA 200MG CAPSULE: 4.0000 | ORAL_CAPSULE | Freq: Two times a day (BID) | ORAL | 0 refills | Status: AC

## 2022-03-04 MED ORDER — EMERGEN-C IMMUNE PLUS PO PACK: 1.0000 | PACK | Freq: Two times a day (BID) | ORAL | 0 refills | Status: DC

## 2022-03-04 NOTE — Progress Notes (Signed)
Subjective:     Patient ID: Brett Johnson, male   DOB: June 14, 1951, 71 y.o.   MRN: [id number]  This visit type was conducted due to national recommendations for restrictions regarding the COVID-19 Pandemic (e.g. social distancing) in an effort to limit this patient's exposure and mitigate transmission in our community.  Due to their co-morbid illnesses, this patient is at least at moderate risk for complications without adequate follow up.  This format is felt to be most appropriate for this patient at this time.    Documentation for virtual audio and video telecommunications through [place] encounter:  The patient was located at home. The provider was located in the office. The patient did consent to this visit and is aware of possible charges through their insurance for this visit.  The other persons participating in this telemedicine service were none. Time spent on call was 20 minutes and in review of previous records 20 minutes total.  This virtual service is not related to other E/M service within previous 7 days.   HPI Chief Complaint  Patient presents with   Covid Positive    Positive covid. Symptoms started during the night with headache, Wednesday- feeling bad- symptoms, achy, stuffy nose, HA, Thursday- tested positive today.    He reports 1.5 day history of symptoms including started with scratchy throat a day and a half ago.  Yesterday morning was feeling okay but by the afternoon he started feeling worse, malaise.  No sore throat now but he does have headache and stuffy nose, occasional cough.  Some achiness and tired.  He does not feel really bad.  He has felt definitely worse with the flu in the past.  No diarrhea.  No prior history of COVID infection.  He is taking some naproxen and using an over-the-counter cough and cold medication that includes antihistamine, decongestant and cough suppressant.  Otherwise normal state of health.  He has not gotten sick in a long time  and only 5 to call just fine.  No other aggravating or relieving factors. No other complaint.  Past Medical History:  Diagnosis Date   Allergy    spring with pollen   Cancer [facility])    Basal cell cancer; followed annually by dermatology/[facility].   Cataract    forming   Diabetes ([place])    Diverticulosis    Glucose intolerance (impaired glucose tolerance)    Gout 08/02/2012   Herpes labialis    Hyperlipidemia    Hypertension    Internal hemorrhoids    Squamous cell carcinoma of skin 2017   right arm, removed by Dr. [place]   Trigeminal neuralgia of left side of face    since childhood. controlled by tegretol (has had it on both sides in the past)   Tubular adenoma of colon    Current Outpatient Medications on File Prior to Visit  Medication Sig Dispense Refill   allopurinol (ZYLOPRIM) 300 MG tablet Take 1 tablet (300 mg total) by mouth daily. 90 tablet 3   atorvastatin (LIPITOR) 40 MG tablet Take 1 tablet (40 mg total) by mouth daily. 90 tablet 3   carbamazepine (TEGRETOL XR) 100 MG 12 hr tablet TAKE 1 TABLET BY MOUTH EVERY DAY 90 tablet 1   carbamazepine (TEGRETOL) 100 MG chewable tablet CHEW 1 TABLET AS DIRECTED/AS NEEDED FOR TRIGEMINAL NEURALGIA FLARES. 30 tablet 0   cholecalciferol (VITAMIN D) 1000 units tablet Take 1,000 Units by mouth daily.     CINNAMON PO Take by mouth daily.  EPINEPHrine 0.3 mg/0.3 mL IJ SOAJ injection Inject 0.3 mLs (0.3 mg total) into the muscle as needed for anaphylaxis. 1 each 0   fexofenadine (ALLEGRA) 180 MG tablet Take 180 mg by mouth daily.     glucose blood (ONE TOUCH ULTRA TEST) test strip Use as instructed 200 each 12   losartan (COZAAR) 25 MG tablet Take 1 tablet (25 mg total) by mouth daily. 90 tablet 3   Melatonin 3 MG TABS Take 3 mg by mouth as needed.     metFORMIN (GLUCOPHAGE-XR) 500 MG 24 hr tablet Take 1 tablet before breakfast, 2 tablets before dinner 270 tablet 1   MILK THISTLE PO Take by mouth daily.      Multiple Vitamin  (MULTIVITAMIN) tablet Take 1 tablet by mouth daily.     naproxen sodium (ANAPROX) 220 MG tablet Take 220 mg by mouth 2 (two) times daily with a meal.     omega-3 acid ethyl esters (LOVAZA) 1 g capsule TAKE TWO CAPSULES BY MOUTH TWICE DAILY 360 capsule 3   sildenafil (REVATIO) 20 MG tablet Take 2-5 tablets once daily as needed for erectile dysfunction 50 tablet 1   valACYclovir (VALTREX) 1000 MG tablet Take 2 tablets at onset of cold sore and repeat once in 12 hours (4 tablets/course) 28 tablet 3   Blood Glucose Monitoring Suppl (ONE TOUCH ULTRA 2) w/Device KIT 1 kit by Does not apply route 2 (two) times daily. 1 each 0   colchicine 0.6 MG tablet Take 2 tablets by mouth at onset of gout flare.  May repeat 1 tablet in 1 hour if needed (max 3 tabs/24 hr) (Patient not taking: Reported on 03/04/2022) 20 tablet 0   diclofenac (VOLTAREN) 75 MG EC tablet TAKE 1 TABLET BY MOUTH TWICE A DAY AS NEEDED (Patient not taking: Reported on 03/04/2022) 60 tablet 0   Lancets (ONETOUCH ULTRASOFT) lancets Use as instructed 200 each 12   No current facility-administered medications on file prior to visit.      Review of Systems As in subjective    Objective:   Physical Exam Due to coronavirus pandemic stay at home measures, patient visit was virtual and they were not examined in person.   Wt 185 lb (83.9 kg)   BMI 28.13 kg/m   Gen: wd, wn nad No wheezing no obvious sob, answers question appropriately     Assessment:     Encounter Diagnoses  Name Primary?   COVID Yes   Nonintractable headache, unspecified chronicity pattern, unspecified headache type        Plan:      General recommendations: I recommend you rest, hydrate well with water and clear fluids throughout the day.   You can use Tylenol for pain or fever You can continue to use over the counter cough and cold medication for the next few days, but hold off on your allergy medication that you haven't been taking in general, while on the cough  and cold medication You can use over the counter Emetrol for nausea.     We discussed vitamin pack below and possibly using Molnupiravir.  He is going to consider Molnupiravir, but will see how he does the next 24-48 hours.  If you are having trouble breathing, if you are very weak, have high fever 103 or higher consistently despite Tylenol, or uncontrollable nausea and vomiting, then call or go to the emergency department.    If you have other questions or have other symptoms or questions you are concerned about  then please make a virtual visit  Covid symptoms such as fatigue and cough can linger over 2 weeks, even after the initial fever, aches, chills, and other initial symptoms.   Self Quarantine: The CDC, Centers for Disease Control has recommended a self quarantine of 5 days from the start of your illness until you are symptom-free including at least 24 hours of no symptoms including no fever, no shortness of breath, and no body aches and chills, by day 5 before returning to work or general contact with the public.  What does self quarantine mean: avoiding contact with people as much as possible.   Particularly in your house, isolate your self from others in a separate room, wear a mask when possible in the room, particularly if coughing a lot.   Have others bring food, water, medications, etc., to your door, but avoid direct contact with your household contacts during this time to avoid spreading the infection to them.   If you have a separate bathroom and living quarters during the next 2 weeks away from others, that would be preferable.    If you can't completely isolate, then wear a mask, wash hands frequently with soap and water for at least 15 seconds, minimize close contact with others, and have a friend or family member check regularly from a distance to make sure you are not getting seriously worse.     You should not be going out in public, should not be going to stores, to work or  other public places until all your symptoms have resolved and at least 5 days + 24 hours of no symptoms at all have transpired.   Ideally you should avoid contact with others for a full 5 days if possible.  One of the goals is to limit spread to high risk people; people that are older and elderly, people with multiple health issues like diabetes, heart disease, lung disease, and anybody that has weakened immune systems such as people with cancer or on immunosuppressive therapy.     Grover was seen today for covid positive.  Diagnoses and all orders for this visit:  COVID  Nonintractable headache, unspecified chronicity pattern, unspecified headache type  Other orders -     molnupiravir EUA (LAGEVRIO) 200 mg CAPS capsule; Take 4 capsules (800 mg total) by mouth 2 (two) times daily for 5 days. -     Multiple Vitamins-Minerals (EMERGEN-C IMMUNE PLUS) PACK; Take 1 tablet by mouth 2 (two) times daily.    F/u prn

## 2022-03-23 ENCOUNTER — Other Ambulatory Visit: Payer: Self-pay | Admitting: Family Medicine

## 2022-03-23 DIAGNOSIS — E1129 Type 2 diabetes mellitus with other diabetic kidney complication: Secondary | ICD-10-CM

## 2022-03-23 DIAGNOSIS — E1169 Type 2 diabetes mellitus with other specified complication: Secondary | ICD-10-CM

## 2022-03-23 DIAGNOSIS — M109 Gout, unspecified: Secondary | ICD-10-CM

## 2022-03-23 NOTE — Telephone Encounter (Signed)
Sent my chart message to see if pt needs meds refilled.

## 2022-03-30 ENCOUNTER — Other Ambulatory Visit: Payer: Medicare Other

## 2022-03-31 ENCOUNTER — Encounter: Payer: Medicare Other | Admitting: Family Medicine

## 2022-04-07 ENCOUNTER — Encounter: Payer: Self-pay | Admitting: Internal Medicine

## 2022-04-15 ENCOUNTER — Other Ambulatory Visit: Payer: Medicare Other

## 2022-04-15 DIAGNOSIS — E1129 Type 2 diabetes mellitus with other diabetic kidney complication: Secondary | ICD-10-CM

## 2022-04-15 DIAGNOSIS — E1169 Type 2 diabetes mellitus with other specified complication: Secondary | ICD-10-CM | POA: Diagnosis not present

## 2022-04-15 DIAGNOSIS — M109 Gout, unspecified: Secondary | ICD-10-CM

## 2022-04-15 DIAGNOSIS — Z5181 Encounter for therapeutic drug level monitoring: Secondary | ICD-10-CM | POA: Diagnosis not present

## 2022-04-15 DIAGNOSIS — R809 Proteinuria, unspecified: Secondary | ICD-10-CM | POA: Diagnosis not present

## 2022-04-15 DIAGNOSIS — Z125 Encounter for screening for malignant neoplasm of prostate: Secondary | ICD-10-CM

## 2022-04-15 DIAGNOSIS — E782 Mixed hyperlipidemia: Secondary | ICD-10-CM | POA: Diagnosis not present

## 2022-04-17 LAB — TSH: TSH: 1.09 u[IU]/mL (ref 0.450–4.500)

## 2022-04-17 LAB — LIPID PANEL
Chol/HDL Ratio: 2.3 ratio (ref 0.0–5.0)
Cholesterol, Total: 133 mg/dL (ref 100–199)
HDL: 58 mg/dL (ref >39)
LDL Chol Calc (NIH): 54 mg/dL (ref 0–99)
Triglycerides: 122 mg/dL (ref 0–149)
VLDL Cholesterol Cal: 21 mg/dL (ref 5–40)

## 2022-04-17 LAB — COMPREHENSIVE METABOLIC PANEL
ALT: 29 IU/L (ref 0–44)
AST: 33 IU/L (ref 0–40)
Albumin/Globulin Ratio: 2.4 — ABNORMAL HIGH (ref 1.2–2.2)
Albumin: 4.8 g/dL (ref 3.9–4.9)
Alkaline Phosphatase: 55 IU/L (ref 44–121)
BUN/Creatinine Ratio: 20 (ref 10–24)
BUN: 16 mg/dL (ref 8–27)
Bilirubin Total: 0.6 mg/dL (ref 0.0–1.2)
CO2: 23 mmol/L (ref 20–29)
Calcium: 10.1 mg/dL (ref 8.6–10.2)
Chloride: 99 mmol/L (ref 96–106)
Creatinine, Ser: 0.82 mg/dL (ref 0.76–1.27)
Globulin, Total: 2 g/dL (ref 1.5–4.5)
Glucose: 158 mg/dL — ABNORMAL HIGH (ref 70–99)
Potassium: 4.6 mmol/L (ref 3.5–5.2)
Sodium: 138 mmol/L (ref 134–144)
Total Protein: 6.8 g/dL (ref 6.0–8.5)
eGFR: 94 mL/min/{1.73_m2} (ref >59)

## 2022-04-17 LAB — CBC WITH DIFFERENTIAL/PLATELET
Basophils Absolute: 0 10*3/uL (ref 0.0–0.2)
Basos: 1 %
EOS (ABSOLUTE): 0.2 10*3/uL (ref 0.0–0.4)
Eos: 3 %
Hematocrit: 43.3 % (ref 37.5–51.0)
Hemoglobin: 14.4 g/dL (ref 13.0–17.7)
Immature Grans (Abs): 0 10*3/uL (ref 0.0–0.1)
Immature Granulocytes: 0 %
Lymphocytes Absolute: 2 10*3/uL (ref 0.7–3.1)
Lymphs: 34 %
MCH: 32.1 pg (ref 26.6–33.0)
MCHC: 33.3 g/dL (ref 31.5–35.7)
MCV: 97 fL (ref 79–97)
Monocytes Absolute: 0.7 10*3/uL (ref 0.1–0.9)
Monocytes: 12 %
Neutrophils Absolute: 2.9 10*3/uL (ref 1.4–7.0)
Neutrophils: 50 %
Platelets: 175 10*3/uL (ref 150–450)
RBC: 4.48 x10E6/uL (ref 4.14–5.80)
RDW: 13.2 % (ref 11.6–15.4)
WBC: 5.7 10*3/uL (ref 3.4–10.8)

## 2022-04-17 LAB — MICROALBUMIN / CREATININE URINE RATIO
Creatinine, Urine: 124.5 mg/dL
Microalb/Creat Ratio: 64 mg/g creat — ABNORMAL HIGH (ref 0–29)
Microalbumin, Urine: 80.2 ug/mL

## 2022-04-17 LAB — HEMOGLOBIN A1C
Est. average glucose Bld gHb Est-mCnc: 154 mg/dL
Hgb A1c MFr Bld: 7 % — ABNORMAL HIGH (ref 4.8–5.6)

## 2022-04-17 LAB — PSA: Prostate Specific Ag, Serum: 0.8 ng/mL (ref 0.0–4.0)

## 2022-04-17 LAB — URIC ACID: Uric Acid: 5 mg/dL (ref 3.8–8.4)

## 2022-04-20 DIAGNOSIS — I10 Essential (primary) hypertension: Secondary | ICD-10-CM | POA: Diagnosis not present

## 2022-04-20 DIAGNOSIS — I498 Other specified cardiac arrhythmias: Secondary | ICD-10-CM | POA: Diagnosis not present

## 2022-04-20 DIAGNOSIS — R059 Cough, unspecified: Secondary | ICD-10-CM | POA: Diagnosis not present

## 2022-04-20 DIAGNOSIS — E785 Hyperlipidemia, unspecified: Secondary | ICD-10-CM | POA: Diagnosis not present

## 2022-04-20 DIAGNOSIS — I491 Atrial premature depolarization: Secondary | ICD-10-CM | POA: Diagnosis not present

## 2022-04-20 DIAGNOSIS — R0602 Shortness of breath: Secondary | ICD-10-CM | POA: Diagnosis not present

## 2022-04-20 DIAGNOSIS — I4891 Unspecified atrial fibrillation: Secondary | ICD-10-CM | POA: Diagnosis not present

## 2022-04-20 DIAGNOSIS — I499 Cardiac arrhythmia, unspecified: Secondary | ICD-10-CM | POA: Diagnosis not present

## 2022-04-20 DIAGNOSIS — Z20822 Contact with and (suspected) exposure to covid-19: Secondary | ICD-10-CM | POA: Diagnosis not present

## 2022-04-21 ENCOUNTER — Telehealth: Payer: Self-pay

## 2022-04-21 NOTE — Telephone Encounter (Signed)
Transition Care Management Unsuccessful Follow-up Telephone Call  Date of discharge and from where:  [facility] hospital 04/20/22  Attempts:  1st Attempt  Reason for unsuccessful TCM follow-up call:  Left voice message

## 2022-04-21 NOTE — Progress Notes (Unsigned)
No chief complaint on file.  Patient presents for follow-up on chronic problems.  He had labs done prior to his visit, see below.  He is also here to f/u recent ER visit.  He went to [facility] 9/19 with itching, shortness of breath, dry cough. They thought he had new onset afib, sent him to ER.  EKG in ER showed ectopic atrial rhythm with PVC's. Evaluation included normal troponin, CBC, PT/INR, and negative RSV/COVID/flu. Normal CXR (hypoventilatory) He was advised to f/u with cardiologist within a week.  Follow up on diabetes. He is compliant with taking Metformin '1500mg'$  daily.  He previously reported he usually took meformin after dinner rather than before, and sometimes between dinner and bedtime. He was asked to try and take it before meals.  A1c was 6.4% in January 2023.  He has microalbuminuria. Eye exam 06/2021, no retinopathy.  He checks his feet regularly, denies concerns. Sometimes the bottoms of his feet feel a little different.   He continues to have 2 glasses of wine most nights (splitting bottle with his wife, over dinner), and a cocktail before dinner a few nights/week. This remains about the same. He eats a late dinner, around 8pm.   Blood sugars are running    Hyperlipidemia:  Following a lowfat, low cholesterol diet. Tolerating atorvastatin and Lovaza without side effects, reports compliance.     Hypertension follow-up:  He is tolerating losartan without side effects. Denies dizziness, headaches, chest pain, edema. Blood pressure runs  Irregular heartbeat icon has shown occasionally.  He denies any palpitations or symptoms.  BP Readings from Last 3 Encounters:  08/31/21 130/75  04/01/21 130/71  02/12/21 119/65    Gout--taking allopurinol. Denies any recent flares (prior flares had been in his toes, ankle). Can't recall the last flare.    Trigeminal neuralgia since childhood. Saw neuro in the past and put on tegretol.  Weaned to every other day, which still controls it.  If  he develops symptoms, he increases the dose back up to daily and also uses short-acting tegretol. Can't recall when the last flare was.   Vitamin D deficiency--s/p 12 weeks of prescription therapy in the past.  Last check was 34.0 in 08/2020, when taking MVI daily plus 1000 IU.  He is currently on same supplements    PMH, PSH, SH reviewed  ROS: Denies fever, chills, URI symptoms, headaches, dizziness, shortness of breath, chest pain.  Denies nausea, vomiting, bowel changes, urinary complaints, bleeding, bruising, rash. No joint pains, moods are normal.  Itching?  SOB Palpitations?  See HPI  PHYSICAL EXAM:  There were no vitals taken for this visit.  Wt Readings from Last 3 Encounters:  03/04/22 185 lb (83.9 kg)  08/31/21 192 lb 6.4 oz (87.3 kg)  04/01/21 189 lb 6.4 oz (85.9 kg)   Well-developed, pleasant male, in no distress HEENT: conjunctiva and sclera are clear, EOMI Neck: no lymphadenopathy, thryomegaly or bruit Heart: regular rate and rhythm.  Some ectopy/skipped beats noted. No murmur Lungs: clear bilaterally Abdomen: soft, nontender, no mass Extremities: no edema Skin: normal turgor, no rash Psych: normal mood, affect, hygiene and grooming   ***update heart  Lab Results  Component Value Date   HGBA1C 7.0 (H) 04/15/2022   Urine microalb/Cr 64 (high) Fasting glu 158    Chemistry      Component Value Date/Time   NA 138 04/15/2022 0842   K 4.6 04/15/2022 0842   CL 99 04/15/2022 0842   CO2 23 04/15/2022 0842   BUN  16 04/15/2022 0842   CREATININE 0.82 04/15/2022 0842   CREATININE 0.90 01/05/2017 0757      Component Value Date/Time   CALCIUM 10.1 04/15/2022 0842   ALKPHOS 55 04/15/2022 0842   AST 33 04/15/2022 0842   ALT 29 04/15/2022 0842   BILITOT 0.6 04/15/2022 0842     Lab Results  Component Value Date   CHOL 133 04/15/2022   HDL 58 04/15/2022   LDLCALC 54 04/15/2022   LDLDIRECT 126.5 02/18/2012   TRIG 122 04/15/2022   CHOLHDL 2.3 04/15/2022    Lab Results  Component Value Date   WBC 5.7 04/15/2022   HGB 14.4 04/15/2022   HCT 43.3 04/15/2022   MCV 97 04/15/2022   PLT 175 04/15/2022   Lab Results  Component Value Date   TSH 1.090 04/15/2022   Lab Results  Component Value Date   LABURIC 5.0 04/15/2022   Lab Results  Component Value Date   PSA1 0.8 04/15/2022   PSA1 0.8 03/30/2021   PSA1 0.6 03/26/2020   PSA 0.6 04/15/2016   PSA 0.66 11/13/2014   PSA 0.69 01/02/2014    ASSESSMENT/PLAN:  High dose flu shot RSV and COVID   Sheena RF allopurinol and lipitor x 1 yr (prior to labs)??!?!?!?!? Needs Lovaza, metformin --?increase dose? --?STUDY?? --farxiga/jardiance for renoprotection and CV benefit???  Microalbuminuria DM above goal Lipids at goal  Atrial arrhythmia?  F/u with Dr. Tamala Julian

## 2022-04-21 NOTE — Telephone Encounter (Signed)
Transition Care Management Follow-up Telephone Call Date of discharge and from where: [place] 04/20/22 How have you been since you were released from the hospital? fair Any questions or concerns? No  Items Reviewed: Did the pt receive and understand the discharge instructions provided? Yes  Medications obtained and verified? Yes  Other? No  Any new allergies since your discharge? No  Dietary orders reviewed? Yes Do you have support at home? Yes   Home Care and Equipment/Supplies: Were home health services ordered? no   Follow up appointments reviewed:  PCP Hospital f/u appt confirmed? Yes  Scheduled to see Dr. Tomi Bamberger on 04/22/22 @ 11:30. [facility] f/u appt confirmed? No  pt. Needs to call cardiology to schedule f/u which he stated he will do.  Are transportation arrangements needed? No  If their condition worsens, is the pt aware to call PCP or go to the Emergency Dept.? Yes Was the patient provided with contact information for the PCP's office or ED? Yes Was to pt encouraged to call back with questions or concerns? Yes

## 2022-04-21 NOTE — Patient Instructions (Incomplete)
I recommend getting the new RSV vaccine from the pharmacy (should wait 2 weeks after today's flu shot). I also recommend getting the updated COVID booster when it becomes available.  This should be 2 weeks from other vaccines as well. (Feel free to switch the order of these 2 vaccines if the COVID vaccine is available in 2 weeks).  Your diabetes is not adequately treated, and you continue to spill protein into the urine (a sign of diabetes affecting your kidneys).

## 2022-04-22 ENCOUNTER — Ambulatory Visit (INDEPENDENT_AMBULATORY_CARE_PROVIDER_SITE_OTHER): Payer: Medicare Other | Admitting: Family Medicine

## 2022-04-22 ENCOUNTER — Encounter: Payer: Self-pay | Admitting: Family Medicine

## 2022-04-22 VITALS — BP 132/78 | HR 60 | Ht 68.0 in | Wt 194.0 lb

## 2022-04-22 DIAGNOSIS — Z23 Encounter for immunization: Secondary | ICD-10-CM | POA: Diagnosis not present

## 2022-04-22 DIAGNOSIS — E782 Mixed hyperlipidemia: Secondary | ICD-10-CM | POA: Diagnosis not present

## 2022-04-22 DIAGNOSIS — I498 Other specified cardiac arrhythmias: Secondary | ICD-10-CM | POA: Diagnosis not present

## 2022-04-22 DIAGNOSIS — R809 Proteinuria, unspecified: Secondary | ICD-10-CM

## 2022-04-22 DIAGNOSIS — I152 Hypertension secondary to endocrine disorders: Secondary | ICD-10-CM

## 2022-04-22 DIAGNOSIS — E1159 Type 2 diabetes mellitus with other circulatory complications: Secondary | ICD-10-CM | POA: Diagnosis not present

## 2022-04-22 DIAGNOSIS — E559 Vitamin D deficiency, unspecified: Secondary | ICD-10-CM | POA: Diagnosis not present

## 2022-04-22 DIAGNOSIS — E1169 Type 2 diabetes mellitus with other specified complication: Secondary | ICD-10-CM

## 2022-04-22 DIAGNOSIS — E1129 Type 2 diabetes mellitus with other diabetic kidney complication: Secondary | ICD-10-CM

## 2022-04-22 DIAGNOSIS — M109 Gout, unspecified: Secondary | ICD-10-CM | POA: Diagnosis not present

## 2022-04-22 MED ORDER — CARBAMAZEPINE 100 MG PO CHEW: CHEWABLE_TABLET | ORAL | 0 refills | Status: DC

## 2022-04-22 MED ORDER — DICLOFENAC SODIUM 75 MG PO TBEC: 75.0000 mg | DELAYED_RELEASE_TABLET | Freq: Two times a day (BID) | ORAL | 0 refills | Status: DC | PRN

## 2022-04-23 NOTE — Progress Notes (Signed)
I have faxed pharmquest order

## 2022-04-25 NOTE — Progress Notes (Deleted)
Cardiology Office Note:    Date:  04/25/2022   ID:  [name], DOB 10-22-1950, MRN [id number]  PCP:  Rita Ohara, MD   [facility] Providers Cardiologist:  None { Click to update primary MD,subspecialty MD or APP then REFRESH:1}    Referring MD: Rita Ohara, MD   Chief Complaint: ***  History of Present Illness:    Brett Johnson is a *** 71 y.o. male with a hx of pretension, hyperlipidemia, gout.   Referred to cardiology for evaluation of abnormal EKG and seen by Dr. Tamala Julian on 09/13/2017.  EKG demonstrated poor R wave progression raising the question of anterior lateral infarction.  He had no complaints consistent with ischemia.  Reported prior cardiac work-up in 2008 with stress echo that did not demonstrate evidence of ischemia.  There was adequate exercise tolerance.  Stress test was done at that time because of "abnormal EKG." Paternal grandfather had coronary disease.  Nuclear exercise stress test with no evidence of prior infarct or ischemia, hypertensive response to exercise, excellent exercise capacity.  LVEF calculated at 49% but visually appears better. Correlation with echocardiogram recommended.  Echo revealed LVEF 50 to 83%, grade 1 diastolic dysfunction, mild to moderate aortic valve regurgitation. Cardiac monitor was ordered due to frequent PVCs during stress test.  Cardiac monitor revealed normal sinus rhythm with PVC and PAC burden each less than 4%, no correlation with symptoms. He was advised to follow-up as needed.  Today, he is here    Past Medical History:  Diagnosis Date   Allergy    spring with pollen   Cancer [facility])    Basal cell cancer; followed annually by dermatology/[facility].   Cataract    forming   Diabetes ([place])    Diverticulosis    Glucose intolerance (impaired glucose tolerance)    Gout 08/02/2012   Herpes labialis    Hyperlipidemia    Hypertension    Internal hemorrhoids    Squamous cell carcinoma of skin 2017   right arm,  removed by Dr. Martinique   Trigeminal neuralgia of left side of face    since childhood. controlled by tegretol (has had it on both sides in the past)   Tubular adenoma of colon     Past Surgical History:  Procedure Laterality Date   COLONOSCOPY  11/01/2010   normal; repeat in 10 years.  [facility].    Current Medications: No outpatient medications have been marked as taking for the 04/29/22 encounter (Appointment) with Ann Maki, Lanice Schwab, NP.     Allergies:   Minocycline and Penicillins   Social History   Socioeconomic History   Marital status: Married    Spouse name: Not on file   Number of children: Not on file   Years of education: Not on file   Highest education level: Not on file  Occupational History   Not on file  Tobacco Use   Smoking status: Never   Smokeless tobacco: Never  Vaping Use   Vaping Use: Never used  Substance and Sexual Activity   Alcohol use: Yes    Alcohol/week: 14.0 standard drinks of alcohol    Types: 14 Standard drinks or equivalent per week    Comment: 2 glasses of wine/night, with 1 cocktail prior to dinner a few times/week   Drug use: No   Sexual activity: Yes    Partners: Female  Other Topics Concern   Not on file  Social History Narrative   Marital status:  Married (6/78)  Children:  1 daughter; 2 grandchildren in [place]      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   Wife also retired spring 2018      Tobacco: none      Alcohol:  2-3 glasses of wine daily, occasional cocktail      Drugs: none      Exercising: walking 30-60 minutes daily.      Seatbelt: 100%      Guns:  Loaded partially secured; no children in house.      Sunscreen:  SPF 30-50.      Updated 08/2021   Social Determinants of Health   Financial Resource Strain: Not on file  Food Insecurity: Not on file  Transportation Needs: Not on file  Physical Activity:  Not on file  Stress: Not on file  Social Connections: Not on file     Family History: The patient's ***family history includes Arthritis in his father; Cancer in his brother and father; Colon polyps in his father; Diabetes in his brother and father; Heart disease in his father and maternal grandfather; Hyperlipidemia in his brother and father; Hypertension in his brother; Kidney Stones in his daughter; Parkinson's disease in his father; Pulmonary embolism in his mother. There is no history of Colon cancer, Esophageal cancer, Rectal cancer, or Stomach cancer.  ROS:   Please see the history of present illness.    *** All other systems reviewed and are negative.  Labs/Other Studies Reviewed:    The following studies were reviewed today:  Exercise Myoview 10/05/17  Nuclear stress EF: 49%. Blood pressure demonstrated a hypertensive response to exercise. There was no ST segment deviation noted during stress. The study is normal. This is a low risk study. The left ventricular ejection fraction is mildly decreased (45-54%).   Normal exercise nuclear stress test with no evidence for prior infarct and ischemia.  Hypertensive response to exercise.  Excellent exercise capacity.  LVEF calculated at 49% but visually appears better,  Correlation with an echocardiogram is recommended.  Echo 10/21/17   Left ventricle: The cavity size was normal. Wall thickness was    normal. Systolic function was normal. The estimated ejection    fraction was in the range of 50% to 55%. Doppler parameters are    consistent with abnormal left ventricular relaxation (grade 1    diastolic dysfunction).  - Aortic valve: There was mild to moderate regurgitation directed    centrally in the LVOT.  - Left atrium: The atrium was mildly dilated.   Cardiac monitor 10/30/17  PVC's and AIVR occur and are assymptomatinc No sustained arrhythmis PAC's ith brief assymptomatic runs.   NSR PAC's and PVC's with brief  asymptomatic runs including accelerated idioventricular rhythm. PVC and PAC burden each < 4%.     Minimum HR: 42 BPM at 7:42:23 AM(2) Maximum HR: 110 BPM at 7:12:48 PM Average HR: 65 BPM   Recent Labs: 04/15/2022: ALT 29; BUN 16; Creatinine, Ser 0.82; Hemoglobin 14.4; Platelets 175; Potassium 4.6; Sodium 138; TSH 1.090  Recent Lipid Panel    Component Value Date/Time   CHOL 133 04/15/2022 0842   TRIG 122 04/15/2022 0842   HDL 58 04/15/2022 0842   CHOLHDL 2.3 04/15/2022 0842   CHOLHDL 2.9 01/05/2017 0757   VLDL 39 (H) 01/05/2017 0757   LDLCALC 54 04/15/2022 0842   LDLDIRECT 126.5 02/18/2012 0819     Risk Assessment/Calculations:   {  Does this patient have ATRIAL FIBRILLATION?[phone]}       Physical Exam:    VS:  There were no vitals taken for this visit.    Wt Readings from Last 3 Encounters:  04/22/22 194 lb (88 kg)  03/04/22 185 lb (83.9 kg)  08/31/21 192 lb 6.4 oz (87.3 kg)     GEN: *** Well nourished, well developed in no acute distress HEENT: Normal NECK: No JVD; No carotid bruits CARDIAC: ***RRR, no murmurs, rubs, gallops RESPIRATORY:  Clear to auscultation without rales, wheezing or rhonchi  ABDOMEN: Soft, non-tender, non-distended MUSCULOSKELETAL:  No edema; No deformity. *** pedal pulses, ***bilaterally SKIN: Warm and dry NEUROLOGIC:  Alert and oriented x 3 PSYCHIATRIC:  Normal affect   EKG:  EKG is *** ordered today.  The ekg ordered today demonstrates ***  No BP recorded.  {Refresh Note OR Click here to enter BP  :1}***    Diagnoses:    No diagnosis found. Assessment and Plan:     Hypertension:   {Are you ordering a CV Procedure (e.g. stress test, cath, DCCV, TEE, etc)?   Press F2        :[id number]}   Disposition:  Medication Adjustments/Labs and Tests Ordered: Current medicines are reviewed at length with the patient today.  Concerns regarding medicines are outlined above.  No orders of the defined types were placed in this  encounter.  No orders of the defined types were placed in this encounter.   There are no Patient Instructions on file for this visit.   Signed, Emmaline Life, NP  04/25/2022 1:44 PM    [place]

## 2022-04-26 ENCOUNTER — Encounter: Payer: Self-pay | Admitting: Family Medicine

## 2022-04-28 ENCOUNTER — Encounter: Payer: Self-pay | Admitting: Family Medicine

## 2022-04-28 DIAGNOSIS — B001 Herpesviral vesicular dermatitis: Secondary | ICD-10-CM

## 2022-04-28 MED ORDER — VALACYCLOVIR HCL 1 G PO TABS: ORAL_TABLET | ORAL | 3 refills | Status: DC

## 2022-04-29 ENCOUNTER — Ambulatory Visit: Payer: Medicare Other | Admitting: Nurse Practitioner

## 2022-05-11 ENCOUNTER — Encounter: Payer: Self-pay | Admitting: Internal Medicine

## 2022-05-15 ENCOUNTER — Other Ambulatory Visit: Payer: Self-pay | Admitting: Family Medicine

## 2022-05-20 ENCOUNTER — Other Ambulatory Visit: Payer: Self-pay | Admitting: Family Medicine

## 2022-05-20 DIAGNOSIS — E1129 Type 2 diabetes mellitus with other diabetic kidney complication: Secondary | ICD-10-CM

## 2022-05-20 DIAGNOSIS — M109 Gout, unspecified: Secondary | ICD-10-CM

## 2022-05-21 NOTE — Progress Notes (Unsigned)
Cardiology Office Note:    Date:  05/21/2022   ID:  Brett Johnson, DOB [birth year], MRN [id number]  PCP:  Rita Ohara, [place] Providers Cardiologist:  None { Click to update primary MD,subspecialty MD or APP then REFRESH:1}    Referring MD: Rita Ohara, MD   No chief complaint on file. ***  History of Present Illness:    Brett Johnson is a 71 y.o. male with a hx of ***  Past Medical History:  Diagnosis Date   Allergy    spring with pollen   Cancer [facility])    Basal cell cancer; followed annually by dermatology/[facility].   Cataract    forming   Diabetes ([place])    Diverticulosis    Glucose intolerance (impaired glucose tolerance)    Gout 08/02/2012   Herpes labialis    Hyperlipidemia    Hypertension    Internal hemorrhoids    Squamous cell carcinoma of skin 2017   right arm, removed by Dr. Martinique   Trigeminal neuralgia of left side of face    since childhood. controlled by tegretol (has had it on both sides in the past)   Tubular adenoma of colon     Past Surgical History:  Procedure Laterality Date   COLONOSCOPY  11/01/2010   normal; repeat in 10 years.  [facility].    Current Medications: No outpatient medications have been marked as taking for the 05/24/22 encounter (Appointment) with Freada Bergeron, MD.     Allergies:   Minocycline and Penicillins   Social History   Socioeconomic History   Marital status: Married    Spouse name: Not on file   Number of children: Not on file   Years of education: Not on file   Highest education level: Not on file  Occupational History   Not on file  Tobacco Use   Smoking status: Never   Smokeless tobacco: Never  Vaping Use   Vaping Use: Never used  Substance and Sexual Activity   Alcohol use: Yes    Alcohol/week: 14.0 standard drinks of alcohol    Types: 14 Standard drinks or equivalent per week    Comment: 2 glasses of wine/night, with 1 cocktail prior to dinner a few times/week    Drug use: No   Sexual activity: Yes    Partners: Female  Other Topics Concern   Not on file  Social History Narrative   Marital status:  Married (6/78)      Children:  1 daughter; 2 grandchildren in [place]      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   Wife also retired spring 2018      Tobacco: none      Alcohol:  2-3 glasses of wine daily, occasional cocktail      Drugs: none      Exercising: walking 30-60 minutes daily.      Seatbelt: 100%      Guns:  Loaded partially secured; no children in house.      Sunscreen:  SPF 30-50.      Updated 08/2021   Social Determinants of Health   Financial Resource Strain: Not on file  Food Insecurity: Not on file  Transportation Needs: Not on file  Physical Activity: Not on file  Stress: Not on file  Social Connections: Not on file     Family History: The  patient's ***family history includes Arthritis in his father; Cancer in his brother and father; Colon polyps in his father; Diabetes in his brother and father; Heart disease in his father and maternal grandfather; Hyperlipidemia in his brother and father; Hypertension in his brother; Kidney Stones in his daughter; Parkinson's disease in his father; Pulmonary embolism in his mother. There is no history of Colon cancer, Esophageal cancer, Rectal cancer, or Stomach cancer.  ROS:   Please see the history of present illness.    *** All other systems reviewed and are negative.  EKGs/Labs/Other Studies Reviewed:    The following studies were reviewed today: ***  EKG:  EKG is *** ordered today.  The ekg ordered today demonstrates ***  Recent Labs: 04/15/2022: ALT 29; BUN 16; Creatinine, Ser 0.82; Hemoglobin 14.4; Platelets 175; Potassium 4.6; Sodium 138; TSH 1.090  Recent Lipid Panel    Component Value Date/Time   CHOL 133 04/15/2022 0842   TRIG 122 04/15/2022 0842   HDL 58  04/15/2022 0842   CHOLHDL 2.3 04/15/2022 0842   CHOLHDL 2.9 01/05/2017 0757   VLDL 39 (H) 01/05/2017 0757   LDLCALC 54 04/15/2022 0842   LDLDIRECT 126.5 02/18/2012 0819     Risk Assessment/Calculations:   {Does this patient have ATRIAL FIBRILLATION?:[phone]}  No BP recorded.  {Refresh Note OR Click here to enter BP  :1}***         Physical Exam:    VS:  There were no vitals taken for this visit.    Wt Readings from Last 3 Encounters:  04/22/22 194 lb (88 kg)  03/04/22 185 lb (83.9 kg)  08/31/21 192 lb 6.4 oz (87.3 kg)     GEN: *** Well nourished, well developed in no acute distress HEENT: Normal NECK: No JVD; No carotid bruits LYMPHATICS: No lymphadenopathy CARDIAC: ***RRR, no murmurs, rubs, gallops RESPIRATORY:  Clear to auscultation without rales, wheezing or rhonchi  ABDOMEN: Soft, non-tender, non-distended MUSCULOSKELETAL:  No edema; No deformity  SKIN: Warm and dry NEUROLOGIC:  Alert and oriented x 3 PSYCHIATRIC:  Normal affect   ASSESSMENT:    No diagnosis found. PLAN:    In order of problems listed above:  ***      {Are you ordering a CV Procedure (e.g. stress test, cath, DCCV, TEE, etc)?   Press F2        :[id number]}    Medication Adjustments/Labs and Tests Ordered: Current medicines are reviewed at length with the patient today.  Concerns regarding medicines are outlined above.  No orders of the defined types were placed in this encounter.  No orders of the defined types were placed in this encounter.   There are no Patient Instructions on file for this visit.   Signed, Freada Bergeron, MD  05/21/2022 8:39 AM    [place]

## 2022-05-24 ENCOUNTER — Ambulatory Visit: Payer: Medicare Other | Attending: Cardiology

## 2022-05-24 ENCOUNTER — Ambulatory Visit: Payer: Medicare Other | Attending: Cardiology | Admitting: Cardiology

## 2022-05-24 ENCOUNTER — Telehealth: Payer: Self-pay | Admitting: Family Medicine

## 2022-05-24 ENCOUNTER — Other Ambulatory Visit: Payer: Self-pay | Admitting: Family Medicine

## 2022-05-24 ENCOUNTER — Encounter: Payer: Self-pay | Admitting: Cardiology

## 2022-05-24 VITALS — BP 160/75 | HR 68 | Ht 68.5 in | Wt 193.4 lb

## 2022-05-24 DIAGNOSIS — I351 Nonrheumatic aortic (valve) insufficiency: Secondary | ICD-10-CM | POA: Insufficient documentation

## 2022-05-24 DIAGNOSIS — R9431 Abnormal electrocardiogram [ECG] [EKG]: Secondary | ICD-10-CM | POA: Insufficient documentation

## 2022-05-24 DIAGNOSIS — R002 Palpitations: Secondary | ICD-10-CM | POA: Diagnosis not present

## 2022-05-24 NOTE — Patient Instructions (Signed)
Medication Instructions:   Your physician recommends that you continue on your current medications as directed. Please refer to the Current Medication list given to you today.  *If you need a refill on your cardiac medications before your next appointment, please call your pharmacy*   Testing/Procedures:  Your physician has requested that you have an echocardiogram. Echocardiography is a painless test that uses sound waves to create images of your heart. It provides your doctor with information about the size and shape of your heart and how well your heart's chambers and valves are working. This procedure takes approximately one hour. There are no restrictions for this procedure. Please do NOT wear cologne, perfume, aftershave, or lotions (deodorant is allowed). Please arrive 15 minutes prior to your appointment time.    ZIO XT- Long Term Monitor Instructions  Your physician has requested you wear a ZIO patch monitor for 3 days.  This is a single patch monitor. [employer] supplies one patch monitor per enrollment. Additional stickers are not available. Please do not apply patch if you will be having a Nuclear Stress Test,  Echocardiogram, Cardiac CT, MRI, or Chest Xray during the period you would be wearing the  monitor. The patch cannot be worn during these tests. You cannot remove and re-apply the  ZIO XT patch monitor.  Your ZIO patch monitor will be mailed 3 day USPS to your address on file. It may take 3-5 days  to receive your monitor after you have been enrolled.  Once you have received your monitor, please review the enclosed instructions. Your monitor  has already been registered assigning a specific monitor serial # to you.  Billing and Patient Assistance Program Information  We have supplied [employer] with any of your insurance information on file for billing purposes. [employer] offers a sliding scale Patient Assistance Program for patients that do not have  insurance, or whose  insurance does not completely cover the cost of the ZIO monitor.  You must apply for the Patient Assistance Program to qualify for this discounted rate.  To apply, please call [employer] at [phone], select option 4, select option 2, ask to apply for  Patient Assistance Program. Brett Johnson will ask your household income, and how many people  are in your household. They will quote your out-of-pocket cost based on that information.  [employer] will also be able to set up a 52-month interest-free payment plan if needed.  Applying the monitor   Shave hair from upper left chest.  Hold abrader disc by orange tab. Rub abrader in 40 strokes over the upper left chest as  indicated in your monitor instructions.  Clean area with 4 enclosed alcohol pads. Let dry.  Apply patch as indicated in monitor instructions. Patch will be placed under collarbone on left  side of chest with arrow pointing upward.  Rub patch adhesive wings for 2 minutes. Remove white label marked "1". Remove the white  label marked "2". Rub patch adhesive wings for 2 additional minutes.  While looking in a mirror, press and release button in center of patch. A small green light will  flash 3-4 times. This will be your only indicator that the monitor has been turned on.  Do not shower for the first 24 hours. You may shower after the first 24 hours.  Press the button if you feel a symptom. You will hear a small click. Record Date, Time and  Symptom in the Patient Logbook.  When you are ready to remove the patch, follow instructions  on the last 2 pages of Patient  Logbook. Stick patch monitor onto the last page of Patient Logbook.  Place Patient Logbook in the blue and white box. Use locking tab on box and tape box closed  securely. The blue and white box has prepaid postage on it. Please place it in the mailbox as  soon as possible. Your physician should have your test results approximately 7 days after the  monitor has been mailed back  to [facility].  Call [place] at [phone] if you have questions regarding  your ZIO XT patch monitor. Call them immediately if you see an orange light blinking on your  monitor.  If your monitor falls off in less than 4 days, contact our Monitor department at [phone].  If your monitor becomes loose or falls off after 4 days call [employer] at [phone] for  suggestions on securing your monitor    Follow-Up: At [facility], you and your health needs are our priority.  As part of our continuing mission to provide you with exceptional heart care, we have created designated Provider Care Teams.  These Care Teams include your primary Cardiologist (physician) and Advanced Practice Providers (APPs -  Physician Assistants and Nurse Practitioners) who all work together to provide you with the care you need, when you need it.  We recommend signing up for the patient portal called "MyChart".  Sign up information is provided on this After Visit Summary.  MyChart is used to connect with patients for Virtual Visits (Telemedicine).  Patients are able to view lab/test results, encounter notes, upcoming appointments, etc.  Non-urgent messages can be sent to your provider as well.   To learn more about what you can do with MyChart, go to [email or web address].    Your next appointment:   1 year(s)  The format for your next appointment:   In Person  Provider:   Dr. Johney Frame

## 2022-05-24 NOTE — Progress Notes (Unsigned)
Enrolled for [employer] to mail a ZIO XT long term holter monitor to the patients address on file.  

## 2022-05-24 NOTE — Telephone Encounter (Signed)
Please call re Tegretol XR 100 mg rx, he said that it was denied and he was wanting to know why denied

## 2022-05-24 NOTE — Progress Notes (Signed)
Cardiology Office Note:    Date:  05/24/2022   ID:  Brett Johnson, DOB July 20, 1951, MRN [id number]  PCP:  Rita Ohara, [place] Providers Cardiologist:  None {  Referring MD: Rita Ohara, MD    History of Present Illness:    Brett Johnson is a 71 y.o. male with a hx of HTN, HLD, DMII, and trigeminal neuralgia who was referred to Cardiology by Dr. Tomi Bamberger for further evaluation of abnormal ECG.  Patient seen by Dr. Tomi Bamberger on 04/22/22. Note reviewed. Had recent ER visit with SOB, itching and dry cough. ECG during that visit with ectopic atrial rhythm with PVCs. Trop negative. CXR without acute pathology. He was referred to Cardiology for further evaluation.  Today, he states that he feels much better.  He recounted the experience that led him to visit urgent care and the ED recently. He was in [employer] about to go home when he began sneezing repeatedly many times. His wife says he sneezed longer than usual. He then began itching severely on his legs, so much so that his scratching drew blood. He began feeling lightheaded and short of breath. He believed this might be an allergic reaction and had his epipen on hand. They drove to urgent care at that time. He was told he was possibly in Afib as he had an irregular pulse on exam. They subsequently went to the ED and he was told he was not in Afib but had a possible ectopic atrial rhythm with a PVC. He received IVF and was discharged home. Symptoms resolved while in the ER.  Currently, he feels well and is back to baseline. No palpitations, lightheadedness or dizziness.   His at home blood pressure this morning was 120/70-74. He endorses white coat hypertension.   He is fairly active and is able to walk without chest pain.  He denies any palpitations, chest pain, or peripheral edema. No headaches, syncope, orthopnea, or PND.  Past Medical History:  Diagnosis Date   Allergy    spring with pollen    Cancer [facility])    Basal cell cancer; followed annually by dermatology/[place].   Cataract    forming   Diabetes ([place])    Diverticulosis    Glucose intolerance (impaired glucose tolerance)    Gout 08/02/2012   Herpes labialis    Hyperlipidemia    Hypertension    Internal hemorrhoids    Squamous cell carcinoma of skin 2017   right arm, removed by Dr. [place]   Trigeminal neuralgia of left side of face    since childhood. controlled by tegretol (has had it on both sides in the past)   Tubular adenoma of colon     Past Surgical History:  Procedure Laterality Date   COLONOSCOPY  11/01/2010   normal; repeat in 10 years.  [facility].    Current Medications: Current Meds  Medication Sig   allopurinol (ZYLOPRIM) 300 MG tablet TAKE 1 TABLET BY MOUTH EVERY DAY   atorvastatin (LIPITOR) 40 MG tablet TAKE 1 TABLET BY MOUTH EVERY DAY   Blood Glucose Monitoring Suppl (ONE TOUCH ULTRA 2) w/Device KIT 1 kit by Does not apply route 2 (two) times daily.   carbamazepine (TEGRETOL XR) 100 MG 12 hr tablet TAKE 1 TABLET BY MOUTH EVERY DAY   carbamazepine (TEGRETOL) 100 MG chewable tablet CHEW 1 TABLET AS DIRECTED/AS NEEDED FOR TRIGEMINAL NEURALGIA FLARES.   cholecalciferol (VITAMIN D) 1000 units tablet Take 1,000 Units by mouth  daily.   CINNAMON PO Take by mouth daily.   diclofenac (VOLTAREN) 75 MG EC tablet Take 1 tablet (75 mg total) by mouth 2 (two) times daily as needed.   EPINEPHrine 0.3 mg/0.3 mL IJ SOAJ injection Inject 0.3 mLs (0.3 mg total) into the muscle as needed for anaphylaxis.   fexofenadine (ALLEGRA) 180 MG tablet Take 180 mg by mouth daily.   losartan (COZAAR) 25 MG tablet TAKE 1 TABLET (25 MG TOTAL) BY MOUTH DAILY.   Melatonin 3 MG TABS Take 3 mg by mouth as needed.   metFORMIN (GLUCOPHAGE-XR) 500 MG 24 hr tablet TAKE 1 TABLET BEFORE BREAKFAST, 2 TABLETS BEFORE DINNER   MILK THISTLE PO Take by mouth daily.    Multiple Vitamin (MULTIVITAMIN) tablet Take 1 tablet by mouth daily.    naproxen sodium (ANAPROX) 220 MG tablet Take 220 mg by mouth 2 (two) times daily with a meal.   omega-3 acid ethyl esters (LOVAZA) 1 g capsule TAKE TWO CAPSULES BY MOUTH TWICE DAILY   sildenafil (REVATIO) 20 MG tablet Take 2-5 tablets once daily as needed for erectile dysfunction   valACYclovir (VALTREX) 1000 MG tablet Take 2 tablets at onset of cold sore and repeat once in 12 hours (4 tablets/course)     Allergies:   Minocycline and Penicillins   Social History   Socioeconomic History   Marital status: Married    Spouse name: Not on file   Number of children: Not on file   Years of education: Not on file   Highest education level: Not on file  Occupational History   Not on file  Tobacco Use   Smoking status: Never   Smokeless tobacco: Never  Vaping Use   Vaping Use: Never used  Substance and Sexual Activity   Alcohol use: Yes    Alcohol/week: 14.0 standard drinks of alcohol    Types: 14 Standard drinks or equivalent per week    Comment: 2 glasses of wine/night, with 1 cocktail prior to dinner a few times/week   Drug use: No   Sexual activity: Yes    Partners: Female  Other Topics Concern   Not on file  Social History Narrative   Marital status:  Married (6/78)      Children:  1 daughter; 2 grandchildren in [place]      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   Wife also retired spring 2018      Tobacco: none      Alcohol:  2-3 glasses of wine daily, occasional cocktail      Drugs: none      Exercising: walking 30-60 minutes daily.      Seatbelt: 100%      Guns:  Loaded partially secured; no children in house.      Sunscreen:  SPF 30-50.      Updated 08/2021   Social Determinants of Health   Financial Resource Strain: Not on file  Food Insecurity: Not on file  Transportation Needs: Not on file  Physical Activity: Not on file  Stress: Not on file  Social  Connections: Not on file     Family History: The patient's family history includes Arthritis in his father; Cancer in his brother and father; Colon polyps in his father; Diabetes in his brother and father; Heart disease in his father and maternal grandfather; Hyperlipidemia in his brother and father; Hypertension in his brother; Kidney Stones  in his daughter; Parkinson's disease in his father; Pulmonary embolism in his mother. There is no history of Colon cancer, Esophageal cancer, Rectal cancer, or Stomach cancer.  ROS:   Please see the history of present illness.    All other systems reviewed and are negative.  EKGs/Labs/Other Studies Reviewed:    The following studies were reviewed today:  Echo 10/21/2017 Study Conclusions   - Left ventricle: The cavity size was normal. Wall thickness was    normal. Systolic function was normal. The estimated ejection    fraction was in the range of 50% to 55%. Doppler parameters are    consistent with abnormal left ventricular relaxation (grade 1    diastolic dysfunction).  - Aortic valve: There was mild to moderate regurgitation directed    centrally in the LVOT.  - Left atrium: The atrium was mildly dilated.    EKG:  EKG is personally reviewed. 05/24/22: Sinus rhythm. Rate 68 bpm. Left axis deviation. LVH.  Recent Labs: 04/15/2022: ALT 29; BUN 16; Creatinine, Ser 0.82; Hemoglobin 14.4; Platelets 175; Potassium 4.6; Sodium 138; TSH 1.090  Recent Lipid Panel    Component Value Date/Time   CHOL 133 04/15/2022 0842   TRIG 122 04/15/2022 0842   HDL 58 04/15/2022 0842   CHOLHDL 2.3 04/15/2022 0842   CHOLHDL 2.9 01/05/2017 0757   VLDL 39 (H) 01/05/2017 0757   LDLCALC 54 04/15/2022 0842   LDLDIRECT 126.5 02/18/2012 0819     Risk Assessment/Calculations:      HYPERTENSION CONTROL Vitals:   05/24/22 0939 05/24/22 1000  BP: (!) 156/80 (!) 160/75    The patient's blood pressure is elevated above target today.  In order to address the  patient's elevated BP: The blood pressure is usually elevated in clinic.  Blood pressures monitored at home have been optimal.            Physical Exam:    VS:  BP (!) 160/75 (BP Location: Left Arm, Patient Position: Sitting, Cuff Size: Normal)   Pulse 68   Ht 5' 8.5" (1.74 m)   Wt 193 lb 6.4 oz (87.7 kg)   SpO2 98%   BMI 28.98 kg/m     Wt Readings from Last 3 Encounters:  05/24/22 193 lb 6.4 oz (87.7 kg)  04/22/22 194 lb (88 kg)  03/04/22 185 lb (83.9 kg)     GEN:  Well nourished, well developed in no acute distress HEENT: Normal NECK: No JVD; No carotid bruits CARDIAC: RRR, 1-2/6 murmur, rubs, gallops RESPIRATORY:  Clear to auscultation without rales, wheezing or rhonchi  ABDOMEN: Soft, non-tender, non-distended MUSCULOSKELETAL:  No edema; No deformity  SKIN: Warm and dry NEUROLOGIC:  Alert and oriented x 3 PSYCHIATRIC:  Normal affect   ASSESSMENT:    1. Nonspecific abnormal electrocardiogram (ECG) (EKG)   2. Moderate aortic regurgitation   3. Palpitations     PLAN:    In order of problems listed above:  #Abnormal ECG: ECG from OSH ER with possible ectopic atrial rhythm however baseline is poor and suspect it is NSR with PVCs. Currently doing better with no significant ectopy on ECG. Denies palpitations, SOB, chest pain or exertional symptoms. Prior cardiac monitor with <4% burden of PACs and PVCs. Will repeat for monitoring. -Check zio monitor  #HTN: Elevated in the office but reports white coat HTN. Runs normal at home.  -Continue losartan 86m daily  #Mild to Moderate AR: Noted on TTE in 2019.  -Repeat TTE for monitoring  Follow up: 1 year.  Medication Adjustments/Labs and Tests Ordered: Current medicines are reviewed at length with the patient today.  Concerns regarding medicines are outlined above.  Orders Placed This Encounter  Procedures   LONG TERM MONITOR (3-14 DAYS)   EKG 12-Lead   ECHOCARDIOGRAM COMPLETE   No orders of the  defined types were placed in this encounter.   Patient Instructions  Medication Instructions:   Your physician recommends that you continue on your current medications as directed. Please refer to the Current Medication list given to you today.  *If you need a refill on your cardiac medications before your next appointment, please call your pharmacy*   Testing/Procedures:  Your physician has requested that you have an echocardiogram. Echocardiography is a painless test that uses sound waves to create images of your heart. It provides your doctor with information about the size and shape of your heart and how well your heart's chambers and valves are working. This procedure takes approximately one hour. There are no restrictions for this procedure. Please do NOT wear cologne, perfume, aftershave, or lotions (deodorant is allowed). Please arrive 15 minutes prior to your appointment time.    ZIO XT- Long Term Monitor Instructions  Your physician has requested you wear a ZIO patch monitor for 3 days.  This is a single patch monitor. [employer] supplies one patch monitor per enrollment. Additional stickers are not available. Please do not apply patch if you will be having a Nuclear Stress Test,  Echocardiogram, Cardiac CT, MRI, or Chest Xray during the period you would be wearing the  monitor. The patch cannot be worn during these tests. You cannot remove and re-apply the  ZIO XT patch monitor.  Your ZIO patch monitor will be mailed 3 day USPS to your address on file. It may take 3-5 days  to receive your monitor after you have been enrolled.  Once you have received your monitor, please review the enclosed instructions. Your monitor  has already been registered assigning a specific monitor serial # to you.  Billing and Patient Assistance Program Information  We have supplied [employer] with any of your insurance information on file for billing purposes. [employer] offers a sliding scale Patient  Assistance Program for patients that do not have  insurance, or whose insurance does not completely cover the cost of the ZIO monitor.  You must apply for the Patient Assistance Program to qualify for this discounted rate.  To apply, please call [employer] at [phone], select option 4, select option 2, ask to apply for  Patient Assistance Program. Theodore Demark will ask your household income, and how many people  are in your household. They will quote your out-of-pocket cost based on that information.  [employer] will also be able to set up a 27-month interest-free payment plan if needed.  Applying the monitor   Shave hair from upper left chest.  Hold abrader disc by orange tab. Rub abrader in 40 strokes over the upper left chest as  indicated in your monitor instructions.  Clean area with 4 enclosed alcohol pads. Let dry.  Apply patch as indicated in monitor instructions. Patch will be placed under collarbone on left  side of chest with arrow pointing upward.  Rub patch adhesive wings for 2 minutes. Remove white label marked "1". Remove the white  label marked "2". Rub patch adhesive wings for 2 additional minutes.  While looking in a mirror, press and release button in center of patch. A small green light will  flash  3-4 times. This will be your only indicator that the monitor has been turned on.  Do not shower for the first 24 hours. You may shower after the first 24 hours.  Press the button if you feel a symptom. You will hear a small click. Record Date, Time and  Symptom in the Patient Logbook.  When you are ready to remove the patch, follow instructions on the last 2 pages of Patient  Logbook. Stick patch monitor onto the last page of Patient Logbook.  Place Patient Logbook in the blue and white box. Use locking tab on box and tape box closed  securely. The blue and white box has prepaid postage on it. Please place it in the mailbox as  soon as possible. Your physician should have your test  results approximately 7 days after the  monitor has been mailed back to [facility].  Call [place] at [phone] if you have questions regarding  your ZIO XT patch monitor. Call them immediately if you see an orange light blinking on your  monitor.  If your monitor falls off in less than 4 days, contact our Monitor department at [phone].  If your monitor becomes loose or falls off after 4 days call [employer] at [phone] for  suggestions on securing your monitor    Follow-Up: At [facility], you and your health needs are our priority.  As part of our continuing mission to provide you with exceptional heart care, we have created designated Provider Care Teams.  These Care Teams include your primary Cardiologist (physician) and Advanced Practice Providers (APPs -  Physician Assistants and Nurse Practitioners) who all work together to provide you with the care you need, when you need it.  We recommend signing up for the patient portal called "MyChart".  Sign up information is provided on this After Visit Summary.  MyChart is used to connect with patients for Virtual Visits (Telemedicine).  Patients are able to view lab/test results, encounter notes, upcoming appointments, etc.  Non-urgent messages can be sent to your provider as well.   To learn more about what you can do with MyChart, go to [email or web address].    Your next appointment:   1 year(s)  The format for your next appointment:   In Person  Provider:   Dr. Calton Dach Adamick,acting as a scribe for Freada Bergeron, MD.,have documented all relevant documentation on the behalf of Freada Bergeron, MD,as directed by  Freada Bergeron, MD while in the presence of Freada Bergeron, MD.  I, Freada Bergeron, MD, have reviewed all documentation for this visit. The documentation on 05/24/22 for the exam, diagnosis, procedures, and orders are all  accurate and complete.    Signed, Freada Bergeron, MD  05/24/2022 10:35 AM    [place]

## 2022-05-24 NOTE — Telephone Encounter (Signed)
Spoke with patient, they sent me the chewable.

## 2022-06-03 ENCOUNTER — Other Ambulatory Visit (INDEPENDENT_AMBULATORY_CARE_PROVIDER_SITE_OTHER): Payer: Medicare Other

## 2022-06-03 DIAGNOSIS — Z23 Encounter for immunization: Secondary | ICD-10-CM | POA: Diagnosis not present

## 2022-06-08 ENCOUNTER — Other Ambulatory Visit: Payer: Self-pay | Admitting: Family Medicine

## 2022-06-08 DIAGNOSIS — E782 Mixed hyperlipidemia: Secondary | ICD-10-CM

## 2022-06-09 ENCOUNTER — Ambulatory Visit (HOSPITAL_COMMUNITY): Payer: Medicare Other | Attending: Cardiology

## 2022-06-09 DIAGNOSIS — I351 Nonrheumatic aortic (valve) insufficiency: Secondary | ICD-10-CM | POA: Diagnosis not present

## 2022-06-09 DIAGNOSIS — R002 Palpitations: Secondary | ICD-10-CM | POA: Diagnosis not present

## 2022-06-09 LAB — ECHOCARDIOGRAM COMPLETE
Area-P 1/2: 2.1 cm2
P 1/2 time: 437 msec
S' Lateral: 4.3 cm

## 2022-06-10 ENCOUNTER — Encounter: Payer: Self-pay | Admitting: Cardiology

## 2022-06-10 ENCOUNTER — Encounter: Payer: Self-pay | Admitting: Family Medicine

## 2022-06-10 ENCOUNTER — Telehealth: Payer: Self-pay | Admitting: *Deleted

## 2022-06-10 DIAGNOSIS — I340 Nonrheumatic mitral (valve) insufficiency: Secondary | ICD-10-CM

## 2022-06-10 DIAGNOSIS — I77819 Aortic ectasia, unspecified site: Secondary | ICD-10-CM

## 2022-06-10 DIAGNOSIS — I351 Nonrheumatic aortic (valve) insufficiency: Secondary | ICD-10-CM

## 2022-06-10 NOTE — Telephone Encounter (Signed)
-----   Message from Freada Bergeron, MD sent at 06/10/2022  9:39 AM EST ----- His echo shows his pumping function is normal. There is mild leakiness of the mitral valve and mild leakiness of the aortic valve. His aorta is mildly dilated. We will watch this with repeat echoes yearly and we just need to ensure his blood pressure is well controlled to prevent this from worsening.  A very small part of his muscle moves a little slowly (hypokinesis). If he is feeling okay and able to complete activity without any chest pain/SOB, we will just monitor this for now. If he is having worsening symptoms with exertion, will check a myoview to assess further.

## 2022-06-10 NOTE — Telephone Encounter (Signed)
The patient has been notified of the result and verbalized understanding.  All questions (if any) were answered.  Pt states he does not have any chest pain/SOB when completing any activity.  Pt states he is very active, walks everyday and swims frequently.  He states he can complete these task with no difficulties and with no sob or chest pain.  He states he will keep Dr. Johney Frame posted as needed, if symptoms do occur with these activity.   Pt aware that he needs to continue monitoring his pressures at home and to maintain good BP control.  He is aware to avoid excess salt in his diet and to continue with his current med regimen.  He states he monitors his pressures at home and will keep Dr. Johney Frame posted, if he has any concerning or high values.   Pt aware that I will place an order for repeat echo in one year in the system and send a message to our Echo Scheduler to call him back and arrange this appt.   Pt states he will be seeing his PCP in the new year for a Physical, and will keep [place] posted as needed, if there are any concerns with his BP then.   Pt aware I will share all this information with Dr. Johney Frame, as well as forward a copy of his echo report to his PCP on file.   He is aware we will follow-up with him when his zio monitor is complete.  Pt verbalized understanding and agrees with this plan.

## 2022-06-15 ENCOUNTER — Encounter: Payer: Self-pay | Admitting: *Deleted

## 2022-06-15 ENCOUNTER — Encounter: Payer: Self-pay | Admitting: Family Medicine

## 2022-06-17 ENCOUNTER — Encounter: Payer: Self-pay | Admitting: Family Medicine

## 2022-06-23 DIAGNOSIS — R002 Palpitations: Secondary | ICD-10-CM | POA: Diagnosis not present

## 2022-06-30 DIAGNOSIS — L821 Other seborrheic keratosis: Secondary | ICD-10-CM | POA: Diagnosis not present

## 2022-06-30 DIAGNOSIS — Z85828 Personal history of other malignant neoplasm of skin: Secondary | ICD-10-CM | POA: Diagnosis not present

## 2022-06-30 DIAGNOSIS — L570 Actinic keratosis: Secondary | ICD-10-CM | POA: Diagnosis not present

## 2022-06-30 DIAGNOSIS — D1801 Hemangioma of skin and subcutaneous tissue: Secondary | ICD-10-CM | POA: Diagnosis not present

## 2022-07-06 DIAGNOSIS — H43811 Vitreous degeneration, right eye: Secondary | ICD-10-CM | POA: Diagnosis not present

## 2022-07-06 DIAGNOSIS — H25813 Combined forms of age-related cataract, bilateral: Secondary | ICD-10-CM | POA: Diagnosis not present

## 2022-07-06 DIAGNOSIS — E119 Type 2 diabetes mellitus without complications: Secondary | ICD-10-CM | POA: Diagnosis not present

## 2022-07-06 DIAGNOSIS — H40013 Open angle with borderline findings, low risk, bilateral: Secondary | ICD-10-CM | POA: Diagnosis not present

## 2022-07-06 LAB — HM DIABETES EYE EXAM

## 2022-07-08 ENCOUNTER — Encounter: Payer: Self-pay | Admitting: *Deleted

## 2022-08-21 ENCOUNTER — Encounter: Payer: Self-pay | Admitting: Family Medicine

## 2022-08-21 ENCOUNTER — Other Ambulatory Visit: Payer: Self-pay | Admitting: Family Medicine

## 2022-08-21 DIAGNOSIS — E1169 Type 2 diabetes mellitus with other specified complication: Secondary | ICD-10-CM

## 2022-08-21 MED ORDER — OMEGA-3-ACID ETHYL ESTERS 1 G PO CAPS: 2.0000 | ORAL_CAPSULE | Freq: Two times a day (BID) | ORAL | 0 refills | Status: DC

## 2022-08-23 ENCOUNTER — Encounter: Payer: Self-pay | Admitting: Family Medicine

## 2022-08-28 ENCOUNTER — Telehealth: Payer: Self-pay | Admitting: Family Medicine

## 2022-08-28 NOTE — Telephone Encounter (Signed)
P.A. OMEGA 3 approved til 08/28/23, sent mychart message

## 2022-09-06 ENCOUNTER — Ambulatory Visit: Payer: Medicare Other | Admitting: Family Medicine

## 2022-09-15 NOTE — Progress Notes (Signed)
Chief Complaint  Patient presents with   Medicare Wellness    Fasting AWV. Over the last about 6 months he does wake up in the middle of the night with some abdominal pain and some diarrhea occasionally. Will take a RF on his Tegretol. And will take Prevnar today. No other concerns.    Brett Johnson is a 72 y.o. male who presents for annual wellness visit and follow-up on chronic medical conditions.    Over the last few months, he occasionally wakes up in the middle of the night having to have a bowel movement, and it is loose/unformed.  No abdominal pain. Doesn't occur every week, sometimes twice in one week.  Can't associate with any particular food. He does have some gas related to when he takes his Metformin (now taking it at 6pm rather than later).  Follow up on diabetes. He is compliant with taking Metformin 1575m daily (1 in the morning, 2 in the evening). Morning dose is before he eats.  He eats late (never before 8), but is now much better at taking the metformin earlier/before eating (takes it around 6pm). He has microalbuminuria. Eye exam was 07/2022. He checks his feet regularly, denies concerns.    He continues to have 2 glasses of wine most nights (splitting bottle with his wife, over dinner), and a cocktail before dinner a few nights/week. This remains about the same. A1c was up to 7 (from 6.4) in September, which he related to time with grandkids over the summer (was their daycare during the week), not eatoing as well when watching them. He reported his A1c through LMontserratwas 6.79 in November, still on 15049m(never increased the dose).  In September we discussed options--Pharmquest study, vs max out metformin to 2g daily (and try taking meds all before breakfast, since taking well after eating dinner much of the time), or add another med--discussed options including farxiga/jardiance (CV benefits, and poss renal), vs rybelsus, vs januvia class. He did not do the  Pharmquest study.  He did not increase the metformin to 200066maily as we had discussed. HbA1c today was up to 7.2%.  He states he has been traveling a lot.  No change in diet at home, but [place]  When traveling, drinks more alcohol, eats food he doesn't usually eat (ie burger).  Doesn't check sugars while away. His sugars were better after trip to [place]--had been very active, lots of walking. He didn't bring in list of sugars, just average from his monitor--  90d day average sugar 139, with low of 110, high of 165 (never checked while traveling, when sugars were likely much higher).   He is asking about weekly injections/ozempic as diabetes treatment option. He is a little concerned about increasing metformin dose, related to possible GI effects, notes some gas related to taking 2 tablets together in the evening.   Hyperlipidemia:  Following a lowfat, low cholesterol diet. Tolerating atorvastatin and Lovaza without side effects, reports compliance.  Lipids were at goal on this regimen in September.  Lab Results  Component Value Date   CHOL 133 04/15/2022   HDL 58 04/15/2022   LDLCALC 54 04/15/2022   LDLDIRECT 126.5 02/18/2012   TRIG 122 04/15/2022   CHOLHDL 2.3 04/15/2022    Hypertension follow-up:  He is tolerating losartan without side effects. Denies headaches, chest pain, edema. He had a few episodes where he felt a little dizzy when standing  and voiding, when up in the middle of the night.  This occurred a few times over a few weeks, but not recently.  No other dizziness.   Blood pressure has been running: 111-159/59-81. Mostly upper 120's-130's/60's. Elevated this past week, 140's/60's. This morning was 135-140/69-76.  After exercise (at 1pm) BP 124/70 (He checks it 3-4 times at once, the first value usually higher, as he doesn't relax/rest prior to checking).   BP Readings from Last 3 Encounters:   09/16/22 130/70  05/24/22 (!) 160/75  04/22/22 132/78    Irregular heartbeat icon was noted frequently on his prior blood pressure monitor, and had abnormal EKG.  He was referred to cardiology and saw Dr. Johney Frame in October. He since got a new BP monitor. He had Zio patch 06/2022   Patch wear time was 3 days   Predominant rhythm was NSR with average HR 74bpm   There were 7 runs of NSVT with longest lasting 8 beats   There were 530 runs of SVT with longest lasting 32.6s   Isolated SVE were occasional 5.0%   Isolated VE were occasional 3.9%   No Afib or significant pauses  He declined offer of metoprolol, as he wasn't having frequent palpitations. He still denies palpitations, just the "funny" feeling (slightly dizzy) when voiding, sporadically.  Echo 06/2022: IMPRESSIONS   1. Hypokinesis of the basal inferolateral wall with overall preserved LV  function; mild AI.   2. Left ventricular ejection fraction, by estimation, is 55 to 60%. The  left ventricle has normal function. The left ventricle demonstrates  regional wall motion abnormalities (see scoring diagram/findings for  description). The left ventricular internal cavity size was mildly dilated.  Left ventricular diastolic parameters are consistent with Grade I diastolic  dysfunction (impaired relaxation).   3. Right ventricular systolic function is normal. The right ventricular  size is normal.   4. Left atrial size was moderately dilated.   5. The mitral valve is normal in structure. Mild mitral valve  regurgitation. No evidence of mitral stenosis.   6. The aortic valve is tricuspid. Aortic valve regurgitation is mild.  Aortic valve sclerosis is present, with no evidence of aortic valve  stenosis.   7. Aortic dilatation noted. There is mild dilatation of the aortic root,  measuring 41 mm.   8. The inferior vena cava is normal in size with greater than 50%  respiratory variability, suggesting right atrial pressure of 3  mmHg.   Repeat echo and f/u with Dr. Johney Frame was recommended in 1 year (06/2023).  Gout--taking allopurinol. Denies any recent flares (prior flares had been in his toes, ankle). Can't recall the last flare. Lab Results  Component Value Date   LABURIC 5.0 04/15/2022      Trigeminal neuralgia since childhood. Saw neuro in the past and put on tegretol. Weaned to every other day, which still controls it.  If he develops symptoms, he increases the dose back up to daily and also uses short-acting tegretol. Last big flare was when he had COVID last summer, one very mild flare since.   Vitamin D deficiency--s/p 12 weeks of prescription therapy in the past.  Last check was 34.0 in 08/2020, when taking MVI daily plus 1000 IU.  He is currently on same supplements  ED--Sildenafil was effective in the past (at a dose of 4-5 tablets), didn't like the lack of spontaneity.   He reports he continues to do well without the use of sildenafil. Hasn't needed it recently, still has meds  at home.    Immunization History  Administered Date(s) Administered   COVID-19, mRNA, vaccine(Comirnaty)12 years and older 06/03/2022   Fluad Quad(high Dose 65+) 04/16/2019, 04/22/2020, 04/01/2021, 04/22/2022   Influenza Split 05/11/2012   Influenza, High Dose Seasonal PF 04/13/2017, 05/02/2018   Influenza,inj,Quad PF,6+ Mos 04/23/2013, 05/06/2014, 05/12/2015, 04/28/2016   PFIZER Comirnaty(Gray Top)Covid-19 Tri-Sucrose Vaccine 11/03/2020   PFIZER(Purple Top)SARS-COV-2 Vaccination 08/22/2019, 09/12/2019, 04/28/2020   PNEUMOCOCCAL CONJUGATE-20 09/16/2022   Pfizer Covid-19 Vaccine Bivalent Booster 26yr & up 04/26/2021, 12/27/2021   Pneumococcal Conjugate-13 04/28/2016   Pneumococcal Polysaccharide-23 09/08/2017   Respiratory Syncytial Virus Vaccine,Recomb Aduvanted(Arexvy) 06/15/2022   Td 04/28/2016   Tdap 05/02/2006   Zoster Recombinat (Shingrix) 01/06/2017, 04/13/2017   Zoster, Live 03/05/2013   Last colonoscopy:  01/2021 with Dr. SElvera Bicker(tubular adenomas), diverticulosis, internal hemorrhoids. 7 year f/u recommended Last PSA Lab Results  Component Value Date   PSA1 0.8 04/15/2022   PSA1 0.8 03/30/2021   PSA1 0.6 03/26/2020   PSA 0.6 04/15/2016   PSA 0.66 11/13/2014   PSA 0.69 01/02/2014  Dentist:every 6 months  Ophtho: yearly Exercise:  Walks daily 30-60 minutes, most days (weather-permitting, usually 45 mins).  Light weights and pushups occasionally at home. Swims in the summer only. [facility]   Has been traveling a lot--always walks, [place]  Patient Care Team: KRita Ohara MD as PCP - General (Family Medicine) SLadene Artist MD as Consulting Physician (Gastroenterology) CSydnee Cabal MD as Consulting Physician (Orthopedic Surgery) HDene Gentry MD as Consulting Physician (Sports Medicine) PFreada Bergeron MD as Consulting Physician (Cardiology) Dentist: Dr. ASatira SarkOphtho: Dr. CMidge Averfor cataracts Derm: [facility]ist and neuro many years ago     09/16/2022    2:41 PM 08/31/2021   10:59 AM 08/27/2020   10:07 AM 03/31/2020   10:58 AM 07/18/2019    1:40 PM  Depression screen PHQ 2/9  Decreased Interest 0 0 0 0 0  Down, Depressed, Hopeless 0 0 0 0 0  PHQ - 2 Score 0 0 0 0 0     Falls screen:     09/16/2022    2:41 PM 04/22/2022   11:34 AM 08/31/2021   11:00 AM 04/01/2021   10:30 AM 08/27/2020   10:07 AM  [place] the past year? 0 0 0 0 0  Number falls in past yr: 0 0 0 0   Injury with Fall? 0 0 0 0   Risk for fall due to : No Fall Risks No Fall Risks No Fall Risks No Fall Risks   Follow up Falls evaluation completed Falls evaluation completed Falls evaluation completed Falls evaluation completed      Functional Status Survey: Is the patient deaf or have difficulty hearing?: No Does the patient have difficulty seeing, even when wearing  glasses/contacts?: Yes (has R eye cataract, not yet ready to be removed. Has one on L eye but not causing any issues) Does the patient have difficulty concentrating, remembering, or making decisions?: No Does the patient have difficulty walking or climbing stairs?: No Does the patient have difficulty dressing or bathing?: No Does the patient have difficulty doing errands alone such as visiting a doctor's office or shopping?: No     Mini-Cog--declined by patient, but visually told/showed me how he would draw a clock.   End of Life Discussion:  Patient has a living will and medical power of attorney  PMH, PSH, SH and FH were reviewed and updated  Outpatient Encounter Medications as of 09/16/2022  Medication Sig Note   allopurinol (ZYLOPRIM) 300 MG tablet TAKE 1 TABLET BY MOUTH EVERY DAY    atorvastatin (LIPITOR) 40 MG tablet TAKE 1 TABLET BY MOUTH EVERY DAY    carbamazepine (TEGRETOL XR) 100 MG 12 hr tablet TAKE 1 TABLET BY MOUTH EVERY DAY 09/16/2022: Takes every other day   cholecalciferol (VITAMIN D) 1000 units tablet Take 1,000 Units by mouth daily.    CINNAMON PO Take by mouth daily.    fexofenadine (ALLEGRA) 180 MG tablet Take 180 mg by mouth daily.    losartan (COZAAR) 25 MG tablet TAKE 1 TABLET (25 MG TOTAL) BY MOUTH DAILY.    metFORMIN (GLUCOPHAGE-XR) 500 MG 24 hr tablet TAKE 1 TABLET BEFORE BREAKFAST, 2 TABLETS BEFORE DINNER    MILK THISTLE PO Take by mouth daily.     Multiple Vitamin (MULTIVITAMIN) tablet Take 1 tablet by mouth daily.    omega-3 acid ethyl esters (LOVAZA) 1 g capsule Take 2 capsules (2 g total) by mouth 2 (two) times daily.    carbamazepine (TEGRETOL) 100 MG chewable tablet CHEW 1 TABLET AS DIRECTED/AS NEEDED FOR TRIGEMINAL NEURALGIA FLARES. (Patient not taking: Reported on 09/16/2022) 09/16/2022: As needed   diclofenac (VOLTAREN) 75 MG EC tablet Take 1 tablet (75 mg total) by mouth 2 (two) times daily as needed. (Patient not taking: Reported on 09/16/2022) 09/16/2022:  Prn, rarely   EPINEPHrine 0.3 mg/0.3 mL IJ SOAJ injection Inject 0.3 mLs (0.3 mg total) into the muscle as needed for anaphylaxis. (Patient not taking: Reported on 09/16/2022)    Melatonin 3 MG TABS Take 3 mg by mouth as needed. (Patient not taking: Reported on 09/16/2022) 09/16/2022: As needed, periodlically   naproxen sodium (ANAPROX) 220 MG tablet Take 220 mg by mouth 2 (two) times daily with a meal. (Patient not taking: Reported on 09/16/2022) 09/16/2022: As needed   sildenafil (REVATIO) 20 MG tablet Take 2-5 tablets once daily as needed for erectile dysfunction (Patient not taking: Reported on 09/16/2022) 09/16/2022: As needed   valACYclovir (VALTREX) 1000 MG tablet Take 2 tablets at onset of cold sore and repeat once in 12 hours (4 tablets/course) (Patient not taking: Reported on 09/16/2022) 09/16/2022: As needed   [DISCONTINUED] Blood Glucose Monitoring Suppl (ONE TOUCH ULTRA 2) w/Device KIT 1 kit by Does not apply route 2 (two) times daily.    No facility-administered encounter medications on file as of 09/16/2022.   Allergies  Allergen Reactions   Minocycline Anaphylaxis   Penicillins Hives   ROS: The patient denies anorexia, fever, weight changes, headaches,  vision loss, decreased hearing, ear pain, chest pain, palpitations, dizziness, syncope, dyspnea on exertion, swelling, nausea, vomiting, diarrhea, constipation, abdominal pain, melena, hematochezia, indigestion/heartburn, hematuria, incontinence, weakened urine stream, dysuria, genital lesions, joint pains, numbness, tingling (slight in feet, when barefoot), weakness, suspicious skin lesions, depression, anxiety, abnormal bleeding/bruising, or enlarged lymph nodes.  ED improved. Up at least 2-3 times to void at night, unchanged. Infrequent palpitations. Rare slightly dizzy spells when voiding in the middle of the night, not recently. See HPI.   PHYSICAL EXAM:  BP 130/70   Pulse 60   Ht 5' 8"$  (1.727 m)   Wt 193 lb 3.2 oz (87.6 kg)    BMI 29.38 kg/m   Wt Readings from Last 3 Encounters:  09/16/22 193 lb 3.2 oz (87.6 kg)  05/24/22 193 lb 6.4 oz (87.7 kg)  04/22/22 194 lb (88 kg)  08/3021 Wt 192# 6.4 oz  General Appearance:    Alert, cooperative, no distress, appears stated age  Head:    Normocephalic, without obvious abnormality, atraumatic  Eyes:    PERRL, conjunctiva/corneas clear, EOM's intact, fundi benign  Ears:    Normal TM's and external ear canals  Nose:   No drainage or sinus tenderness  Throat:   Normal mucosa  Neck:   Supple, no lymphadenopathy;  thyroid:  no enlargement/tenderness/ nodules; no carotid bruit or JVD  Back:    Spine nontender, no curvature, ROM normal, no CVA tenderness  Lungs:     Clear to auscultation bilaterally without wheezes, rales or ronchi; respirations unlabored  Chest Wall:    No tenderness or deformity   Heart:    Regular rate and rhythm, S1 and S2 normal, no murmur, rub or gallop. Occasional ectopy noted (pt not aware)  Breast Exam:    No chest wall tenderness, masses or gynecomastia  Abdomen:     Soft, non-tender, nondistended, normoactive bowel sounds, no masses, no hepatosplenomegaly  Genitalia:    Normal male external genitalia without lesions. Testicles without masses. No inguinal hernias.  Rectal:    Normal sphincter tone, no masses or tenderness; guaiac negative stool.  Prostate smooth, no nodules, not enlarged.  Extremities:   No clubbing, cyanosis or edema.   Pulses:   2+ and symmetric all extremities  Skin:   Skin color, texture, turgor normal, no rashes. He has purpura on right forearm  Lymph nodes:   Cervical, supraclavicular, and inguinal nodes normal  Neurologic:   Normal strength, sensation and gait; reflexes 2+ and symmetric throughout. Normal monofilament exam                             Psych:   Normal mood, affect, hygiene and grooming.     Diabetic foot exam--normal  Lab Results  Component Value Date   HGBA1C 7.2 (A) 09/16/2022      ASSESSMENT/PLAN:  Medicare annual wellness visit, subsequent  Type 2 diabetes mellitus with microalbuminuria, without long-term current use of insulin ([place]) - A1c above goal, related to travel, change in diet, not checking sugars then. Increase metformin to 2gm qd; check glu on vacation. Discussed other options - Plan: HgB A1c  Hypertension associated with diabetes ([place]) - well controlled here, some higher values at home. To record comments on log; lower today, had exercised. Low Na diet, wt loss rec  Mixed hyperlipidemia due to type 2 diabetes mellitus ([place]) - at goal per last check, cont current meds  Vitamin D deficiency - continue daily supplements  Microalbuminuria - cont ARB, work on improved control of BP and DM  Screening for prostate cancer  Senile purpura ([place]) - stable, reassurred  Medication monitoring encounter  Acute gout of hand, unspecified cause, unspecified laterality - pt asked if could stop med--advised only if stops alcohol.  Prefers to continue allopurinol (and alcohol)  Need for pneumococcal vaccination - Plan: Pneumococcal conjugate vaccine 20-valent (Prevnar 20)  Trigeminal neuralgia - stable on current regimen (takes qod, doubles up prn and uses short-acting with flares) - Plan: carbamazepine (TEGRETOL XR) 100 MG 12 hr tablet  BMI 29.0-29.9,adult - counseled re: healthy diet, exercise, weight loss  DM--A1c above goal.  Discussed ozempic, answered questions.  Will wait to discuss with his pharmacist friend when he sees her in [place]. In the interim, encouraged him to increase metformin to 2 BID (2gm/day); if he doesn't tolerate  this, can potentially try 1031m BID of regular metformin (currently on ER). Encouraged him to check sugars while on vacation to help hold him accountable. Wt loss, cutting back on alcohol, and healthy diet in general, was encouraged.  Will do [place] A1c again in about May, and [place].  May need med check to  further discuss if remains elevated.  If okay, then f/u in 6 months for med check.  To have  labs prior--A1c, c-met, lipids, CBC, PSA, TSH, urine microalb, uric acid   Discussed PSA screening (risks/benefits), recommended at least 30 minutes of aerobic activity at least 5 days/week, weight-bearing exercise at least 2x/week; proper sunscreen use reviewed; healthy diet and alcohol recommendations (less than or equal to 2 drinks/day) reviewed; regular seatbelt use; changing batteries in smoke detectors.. Immunization recommendations discussed--continue yearly high dose flu shots. Prevnar-20 given. Colon cancer screening recommendations reviewed, due 01/2028  MOST form reviewed/updated. Full Code, Full Care.    Medicare Attestation I have personally reviewed: The patient's medical and social history Their use of alcohol, tobacco or illicit drugs Their current medications and supplements The patient's functional ability including ADLs,fall risks, home safety risks, cognitive, and hearing and visual impairment Diet and physical activities Evidence for depression or mood disorders  The patient's weight, height, BMI, and visual acuity have been recorded in the chart.  I have made referrals, counseling, and provided education to the patient based on review of the above and I have provided the patient with a written personalized care plan for preventive services.

## 2022-09-15 NOTE — Patient Instructions (Incomplete)
HEALTH MAINTENANCE RECOMMENDATIONS:  It is recommended that you get at least 30 minutes of aerobic exercise at least 5 days/week (for weight loss, you may need as much as 60-90 minutes). This can be any activity that gets your heart rate up. This can be divided in 10-15 minute intervals if needed, but try and build up your endurance at least once a week.  Weight bearing exercise is also recommended twice weekly.  Eat a healthy diet with lots of vegetables, fruits and fiber.  "Colorful" foods have a lot of vitamins (ie green vegetables, tomatoes, red peppers, etc).  Limit sweet tea, regular sodas and alcoholic beverages, all of which has a lot of calories and sugar.  Up to 2 alcoholic drinks daily may be beneficial for men (unless trying to lose weight, watch sugars).  Drink a lot of water.  Sunscreen of at least SPF 30 should be used on all sun-exposed parts of the skin when outside between the hours of 10 am and 4 pm (not just when at beach or pool, but even with exercise, golf, tennis, and yard work!)  Use a sunscreen that says "broad spectrum" so it covers both UVA and UVB rays, and make sure to reapply every 1-2 hours.  Remember to change the batteries in your smoke detectors when changing your clock times in the spring and fall.  Carbon monoxide detectors are recommended for your home.  Use your seat belt every time you are in a car, and please drive safely and not be distracted with cell phones and texting while driving.    Brett Johnson , Thank you for taking time to come for your Medicare Wellness Visit. I appreciate your ongoing commitment to your health goals. Please review the following plan we discussed and let me know if I can assist you in the future.   This is a list of the screening recommended for you and due dates:  Health Maintenance  Topic Date Due   COVID-19 Vaccine (8 - 2023-24 season) 07/29/2022   Complete foot exam   08/31/2022   Hemoglobin A1C  10/14/2022   Yearly  kidney function blood test for diabetes  04/16/2023   Yearly kidney health urinalysis for diabetes  04/16/2023   Eye exam for diabetics  07/07/2023   Medicare Annual Wellness Visit  09/17/2023   DTaP/Tdap/Td vaccine (3 - Td or Tdap) 04/28/2026   Colon Cancer Screening  02/13/2028   Pneumonia Vaccine  Completed   Flu Shot  Completed   Hepatitis C Screening: USPSTF Recommendation to screen - Ages 23-79 yo.  Completed   Zoster (Shingles) Vaccine  Completed   HPV Vaccine  Aged Out    We discussed potentially using Ozempic. We also discussed increasing the metformin dose in the interim. We discussed trying 2 pills twice daily of the extended release ones that you have, versus switching to 1000 mg tablet of the regular metformin, taking it twice daily.  You can see which of these two are easier on your stomach. Call us if you want to try the 1000 mg dose, if not tolerating 4 pills/day of the extended release ones. If you start Ozempic, we can likely back down on the metformin, at your sugars improve and as the dose is increased.  I encourage you to check your sugars while on vacation to try and hold your diet somewhat accountable, so that your sugars aren't going too crazy without you knowing it.  If your blood pressure remains elevated (goal is <130/80),  consider starting the metoprolol (beta blocker) suggested by Dr. Allean Found helps with palpitations/abnormal beats, as well as lower blood pressure.

## 2022-09-16 ENCOUNTER — Ambulatory Visit (INDEPENDENT_AMBULATORY_CARE_PROVIDER_SITE_OTHER): Payer: Medicare Other | Admitting: Family Medicine

## 2022-09-16 ENCOUNTER — Encounter: Payer: Self-pay | Admitting: Family Medicine

## 2022-09-16 VITALS — BP 130/70 | HR 60 | Ht 68.0 in | Wt 193.2 lb

## 2022-09-16 DIAGNOSIS — E782 Mixed hyperlipidemia: Secondary | ICD-10-CM

## 2022-09-16 DIAGNOSIS — I152 Hypertension secondary to endocrine disorders: Secondary | ICD-10-CM

## 2022-09-16 DIAGNOSIS — E1129 Type 2 diabetes mellitus with other diabetic kidney complication: Secondary | ICD-10-CM

## 2022-09-16 DIAGNOSIS — Z6829 Body mass index (BMI) 29.0-29.9, adult: Secondary | ICD-10-CM

## 2022-09-16 DIAGNOSIS — Z125 Encounter for screening for malignant neoplasm of prostate: Secondary | ICD-10-CM | POA: Diagnosis not present

## 2022-09-16 DIAGNOSIS — M109 Gout, unspecified: Secondary | ICD-10-CM

## 2022-09-16 DIAGNOSIS — G500 Trigeminal neuralgia: Secondary | ICD-10-CM

## 2022-09-16 DIAGNOSIS — E1169 Type 2 diabetes mellitus with other specified complication: Secondary | ICD-10-CM | POA: Diagnosis not present

## 2022-09-16 DIAGNOSIS — Z23 Encounter for immunization: Secondary | ICD-10-CM | POA: Diagnosis not present

## 2022-09-16 DIAGNOSIS — D692 Other nonthrombocytopenic purpura: Secondary | ICD-10-CM

## 2022-09-16 DIAGNOSIS — Z5181 Encounter for therapeutic drug level monitoring: Secondary | ICD-10-CM | POA: Diagnosis not present

## 2022-09-16 DIAGNOSIS — E1159 Type 2 diabetes mellitus with other circulatory complications: Secondary | ICD-10-CM

## 2022-09-16 DIAGNOSIS — R809 Proteinuria, unspecified: Secondary | ICD-10-CM | POA: Diagnosis not present

## 2022-09-16 DIAGNOSIS — Z0000 Encounter for general adult medical examination without abnormal findings: Secondary | ICD-10-CM

## 2022-09-16 DIAGNOSIS — E559 Vitamin D deficiency, unspecified: Secondary | ICD-10-CM

## 2022-09-16 LAB — POCT GLYCOSYLATED HEMOGLOBIN (HGB A1C): Hemoglobin A1C: 7.2 % — AB (ref 4.0–5.6)

## 2022-09-16 MED ORDER — CARBAMAZEPINE ER 100 MG PO TB12: 100.0000 mg | ORAL_TABLET | Freq: Every day | ORAL | 1 refills | Status: DC

## 2022-09-17 ENCOUNTER — Encounter: Payer: Self-pay | Admitting: Family Medicine

## 2022-09-29 ENCOUNTER — Ambulatory Visit: Payer: Medicare Other | Admitting: Family Medicine

## 2022-10-01 ENCOUNTER — Encounter: Payer: Self-pay | Admitting: Family Medicine

## 2022-10-03 NOTE — Telephone Encounter (Signed)
P.A. done & approved

## 2022-10-12 ENCOUNTER — Encounter: Payer: Self-pay | Admitting: Family Medicine

## 2022-10-12 ENCOUNTER — Encounter: Payer: Self-pay | Admitting: *Deleted

## 2022-11-10 ENCOUNTER — Other Ambulatory Visit: Payer: Self-pay | Admitting: Family Medicine

## 2022-11-10 DIAGNOSIS — R809 Proteinuria, unspecified: Secondary | ICD-10-CM

## 2022-11-12 ENCOUNTER — Encounter: Payer: Self-pay | Admitting: Family Medicine

## 2022-11-12 DIAGNOSIS — E1129 Type 2 diabetes mellitus with other diabetic kidney complication: Secondary | ICD-10-CM

## 2022-11-12 MED ORDER — METFORMIN HCL ER 500 MG PO TB24: ORAL_TABLET | ORAL | 1 refills | Status: DC

## 2023-01-12 ENCOUNTER — Encounter: Payer: Self-pay | Admitting: Family Medicine

## 2023-01-21 ENCOUNTER — Other Ambulatory Visit: Payer: Self-pay | Admitting: Family Medicine

## 2023-01-21 DIAGNOSIS — E1169 Type 2 diabetes mellitus with other specified complication: Secondary | ICD-10-CM

## 2023-03-12 ENCOUNTER — Other Ambulatory Visit: Payer: Self-pay | Admitting: Family Medicine

## 2023-03-12 DIAGNOSIS — E1169 Type 2 diabetes mellitus with other specified complication: Secondary | ICD-10-CM

## 2023-03-12 DIAGNOSIS — M109 Gout, unspecified: Secondary | ICD-10-CM

## 2023-03-12 DIAGNOSIS — E1129 Type 2 diabetes mellitus with other diabetic kidney complication: Secondary | ICD-10-CM

## 2023-04-09 ENCOUNTER — Other Ambulatory Visit: Payer: Self-pay | Admitting: Family Medicine

## 2023-04-09 DIAGNOSIS — E1169 Type 2 diabetes mellitus with other specified complication: Secondary | ICD-10-CM

## 2023-04-09 DIAGNOSIS — M109 Gout, unspecified: Secondary | ICD-10-CM

## 2023-04-09 DIAGNOSIS — E1129 Type 2 diabetes mellitus with other diabetic kidney complication: Secondary | ICD-10-CM

## 2023-04-12 ENCOUNTER — Other Ambulatory Visit: Payer: Medicare Other

## 2023-04-13 ENCOUNTER — Encounter: Payer: Medicare Other | Admitting: Family Medicine

## 2023-04-13 ENCOUNTER — Other Ambulatory Visit: Payer: Medicare Other

## 2023-04-13 NOTE — Progress Notes (Unsigned)
No chief complaint on file.  Patient presents for 6 month follow-up on chronic problems.  Follow up on diabetes:  A1c was elevated at 7.2% in 09/2022.  He was asking about Ozempic--we had discussed in detail, but was waiting to discuss further with his pharmacist friend, whom he'd be seeing in [place]. In the interim, he was advised to increase his Metformin dose from 1500 mg daily to 2000 mg daily (and discussed if he doesn't tolerate this, can potentially try 1000mg  BID of regular metformin (currently on ER)). Encouraged him to check sugars while on vacation to help hold him accountable. Wt loss, cutting back on alcohol, and healthy diet in general, was encouraged.   He was supposed to do another [place] A1c in May, and forward [place] the results--to follow-up for a med check sooner if A1c remained >7.  He did not send [place] a copy of these results.  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 Eye exam was 07/2022. He checks his feet regularly, denies concerns.    Component Ref Range & Units 6 mo ago (09/16/22) 12 mo ago (04/15/22) 1 yr ago (08/31/21) 2 yr ago (03/30/21) 2 yr ago (01/16/21) 2 yr ago (12/31/20) 2 yr ago (08/27/20)  Hemoglobin A1C 4.0 - 5.6 % 7.2 Abnormal  7.0 High  R, CM 6.4 Abnormal  6.8 High  R, CM 6.5 R, CM 6.5 R, CM 6.8 Abnormal      He continues to have 2 glasses of wine most nights (splitting bottle with his wife, over dinner), and a cocktail before dinner a few nights/week. This remains about the same.    At his last visit, he reported traveling a lot.  No change in diet at home, but different when traveling--drinks more alcohol, eats food he doesn't usually eat (ie burger).  Doesn't check sugars while away.   Hyperlipidemia:  Following a lowfat, low cholesterol diet. Tolerating atorvastatin and Lovaza without side effects, reports compliance.  Lipids were at goal on this regimen in September. Due for recheck. Lab  Results  Component Value Date   CHOL 133 04/15/2022   HDL 58 04/15/2022   LDLCALC 54 04/15/2022   LDLDIRECT 126.5 02/18/2012   TRIG 122 04/15/2022   CHOLHDL 2.3 04/15/2022      Hypertension follow-up:  He is tolerating losartan without side effects. Denies headaches, chest pain, edema.   He had a few episodes where he felt a little dizzy when standing and voiding, when up in the middle of the night.  This occurred a few times over a few weeks, but not recently.  No other dizziness. UPDATE ***   Blood pressure has been running:  BP Readings from Last 3 Encounters:  09/16/22 130/70  05/24/22 (!) 160/75  04/22/22 132/78      Irregular heartbeat icon was noted frequently on his prior blood pressure monitor, and had abnormal EKG.  He was referred to cardiology and saw Dr. Shari Prows in October.  Zio patch 06/2022 showed some NSVT and SVT, no atrial fib.  He declined metoprolol, as he wasn't having frequent palpitations.  Echo 06/2022--EF 55-60%, grade 1 diastolic dysfunction. Moderately dilated L atrium, mild MR, mild aortic regurg. Mild dilation of aortic root. Repeat echo and f/u with Dr. Shari Prows was recommended in 1 year (06/2023).   Gout--taking allopurinol. Denies any recent flares (prior flares had been in his toes, ankle). Can't recall the last flare. Due for recheck of uric acid  Lab Results  Component Value Date   LABURIC 5.0 04/15/2022    Trigeminal neuralgia since childhood. Saw neuro in the past and put on tegretol. Weaned to every other day, which still controls it.  If he develops symptoms, he increases the dose back up to daily and also uses short-acting tegretol. No recent flares   PMH, PSH, SH reviewed   ROS: Denies fever, chills, URI symptoms, headaches, dizziness, shortness of breath, chest pain. Denies palpitations. Denies nausea, vomiting, bowel changes, urinary complaints, bleeding, bruising, rash. No joint pains, moods are normal.    PHYSICAL  EXAM:  There were no vitals taken for this visit.  Wt Readings from Last 3 Encounters:  09/16/22 193 lb 3.2 oz (87.6 kg)  05/24/22 193 lb 6.4 oz (87.7 kg)  04/22/22 194 lb (88 kg)    Well-developed, pleasant male, in no distress HEENT: conjunctiva and sclera are clear, EOMI. OP clear Neck: no lymphadenopathy, thryomegaly or bruit Heart: regular rate and rhythm.  Some ectopy/skipped beats noted. Overall regular rhythm. 2/6 SE murmur noted at RUSB Lungs: clear bilaterally Abdomen: soft, nontender, no mass Extremities: no edema Skin: normal turgor, no rash/ Psych: normal mood, affect, hygiene and grooming  ***update ectopy, murmur   ASSESSMENT/PLAN:  Flu and COVID shots  A1c RELEASE LABS if fasting, since he didn't get them done prior to his visit.  He never sent [place] A1c results that he was planning to repeat in May. Is he taking 2000 mg (4 pills) of metformin daily? Did he talk to his pharmacist friend about Ozempic--does he want to try this??

## 2023-04-14 ENCOUNTER — Encounter: Payer: Self-pay | Admitting: Family Medicine

## 2023-04-14 ENCOUNTER — Ambulatory Visit (INDEPENDENT_AMBULATORY_CARE_PROVIDER_SITE_OTHER): Payer: Medicare Other | Admitting: Family Medicine

## 2023-04-14 ENCOUNTER — Other Ambulatory Visit: Payer: Self-pay | Admitting: Family Medicine

## 2023-04-14 VITALS — BP 132/86 | HR 72 | Ht 68.0 in | Wt 194.2 lb

## 2023-04-14 DIAGNOSIS — E1159 Type 2 diabetes mellitus with other circulatory complications: Secondary | ICD-10-CM

## 2023-04-14 DIAGNOSIS — D692 Other nonthrombocytopenic purpura: Secondary | ICD-10-CM

## 2023-04-14 DIAGNOSIS — E1129 Type 2 diabetes mellitus with other diabetic kidney complication: Secondary | ICD-10-CM | POA: Diagnosis not present

## 2023-04-14 DIAGNOSIS — R809 Proteinuria, unspecified: Secondary | ICD-10-CM | POA: Diagnosis not present

## 2023-04-14 DIAGNOSIS — E782 Mixed hyperlipidemia: Secondary | ICD-10-CM

## 2023-04-14 DIAGNOSIS — I152 Hypertension secondary to endocrine disorders: Secondary | ICD-10-CM

## 2023-04-14 DIAGNOSIS — Z125 Encounter for screening for malignant neoplasm of prostate: Secondary | ICD-10-CM

## 2023-04-14 DIAGNOSIS — M109 Gout, unspecified: Secondary | ICD-10-CM

## 2023-04-14 DIAGNOSIS — E1169 Type 2 diabetes mellitus with other specified complication: Secondary | ICD-10-CM

## 2023-04-14 DIAGNOSIS — G500 Trigeminal neuralgia: Secondary | ICD-10-CM | POA: Diagnosis not present

## 2023-04-14 DIAGNOSIS — Z5181 Encounter for therapeutic drug level monitoring: Secondary | ICD-10-CM | POA: Diagnosis not present

## 2023-04-14 LAB — POCT GLYCOSYLATED HEMOGLOBIN (HGB A1C): Hemoglobin A1C: 6.7 % — AB (ref 4.0–5.6)

## 2023-04-14 MED ORDER — NEFFY 2 MG/0.1ML NA SOLN: 2.0000 mg | NASAL | 0 refills | Status: DC

## 2023-04-14 MED ORDER — ALLOPURINOL 300 MG PO TABS
300.0000 mg | ORAL_TABLET | Freq: Every day | ORAL | 3 refills | Status: DC
Start: 2023-04-14 — End: 2024-04-04

## 2023-04-14 MED ORDER — CARBAMAZEPINE ER 100 MG PO TB12
100.0000 mg | ORAL_TABLET | Freq: Every day | ORAL | 1 refills | Status: DC
Start: 2023-04-14 — End: 2024-01-03

## 2023-04-14 MED ORDER — CARBAMAZEPINE 100 MG PO CHEW: CHEWABLE_TABLET | ORAL | 0 refills | Status: DC

## 2023-04-14 MED ORDER — ATORVASTATIN CALCIUM 40 MG PO TABS
40.0000 mg | ORAL_TABLET | Freq: Every day | ORAL | 3 refills | Status: DC
Start: 2023-04-14 — End: 2024-04-04

## 2023-04-14 MED ORDER — LOSARTAN POTASSIUM 25 MG PO TABS
25.0000 mg | ORAL_TABLET | Freq: Every day | ORAL | 1 refills | Status: DC
Start: 2023-04-14 — End: 2023-10-05

## 2023-04-14 MED ORDER — OMEGA-3-ACID ETHYL ESTERS 1 G PO CAPS
2.0000 | ORAL_CAPSULE | Freq: Two times a day (BID) | ORAL | 3 refills | Status: DC
Start: 2023-04-14 — End: 2024-04-19

## 2023-04-14 MED ORDER — METFORMIN HCL ER 500 MG PO TB24
ORAL_TABLET | ORAL | 1 refills | Status: DC
Start: 2023-04-14 — End: 2023-10-07

## 2023-04-15 ENCOUNTER — Encounter: Payer: Self-pay | Admitting: Family Medicine

## 2023-04-15 LAB — CBC WITH DIFFERENTIAL/PLATELET
Basophils Absolute: 0 10*3/uL (ref 0.0–0.2)
Basos: 0 %
EOS (ABSOLUTE): 0.1 10*3/uL (ref 0.0–0.4)
Eos: 3 %
Hematocrit: 43.7 % (ref 37.5–51.0)
Hemoglobin: 14.1 g/dL (ref 13.0–17.7)
Immature Grans (Abs): 0 10*3/uL (ref 0.0–0.1)
Immature Granulocytes: 0 %
Lymphocytes Absolute: 1.6 10*3/uL (ref 0.7–3.1)
Lymphs: 33 %
MCH: 32.1 pg (ref 26.6–33.0)
MCHC: 32.3 g/dL (ref 31.5–35.7)
MCV: 100 fL — ABNORMAL HIGH (ref 79–97)
Monocytes Absolute: 0.7 10*3/uL (ref 0.1–0.9)
Monocytes: 14 %
Neutrophils Absolute: 2.4 10*3/uL (ref 1.4–7.0)
Neutrophils: 50 %
Platelets: 190 10*3/uL (ref 150–450)
RBC: 4.39 x10E6/uL (ref 4.14–5.80)
RDW: 12.6 % (ref 11.6–15.4)
WBC: 4.9 10*3/uL (ref 3.4–10.8)

## 2023-04-15 LAB — COMPREHENSIVE METABOLIC PANEL
ALT: 25 IU/L (ref 0–44)
AST: 17 IU/L (ref 0–40)
Albumin: 4.6 g/dL (ref 3.8–4.8)
Alkaline Phosphatase: 51 IU/L (ref 44–121)
BUN/Creatinine Ratio: 15 (ref 10–24)
BUN: 11 mg/dL (ref 8–27)
Bilirubin Total: 0.5 mg/dL (ref 0.0–1.2)
CO2: 23 mmol/L (ref 20–29)
Calcium: 9.2 mg/dL (ref 8.6–10.2)
Chloride: 102 mmol/L (ref 96–106)
Creatinine, Ser: 0.72 mg/dL — ABNORMAL LOW (ref 0.76–1.27)
Globulin, Total: 1.8 g/dL (ref 1.5–4.5)
Glucose: 170 mg/dL — ABNORMAL HIGH (ref 70–99)
Potassium: 4.9 mmol/L (ref 3.5–5.2)
Sodium: 140 mmol/L (ref 134–144)
Total Protein: 6.4 g/dL (ref 6.0–8.5)
eGFR: 98 mL/min/{1.73_m2} (ref >59)

## 2023-04-15 LAB — LIPID PANEL
Chol/HDL Ratio: 2.1 ratio (ref 0.0–5.0)
Cholesterol, Total: 115 mg/dL (ref 100–199)
HDL: 55 mg/dL (ref >39)
LDL Chol Calc (NIH): 41 mg/dL (ref 0–99)
Triglycerides: 106 mg/dL (ref 0–149)
VLDL Cholesterol Cal: 19 mg/dL (ref 5–40)

## 2023-04-15 LAB — MICROALBUMIN / CREATININE URINE RATIO
Creatinine, Urine: 127.9 mg/dL
Microalb/Creat Ratio: 74 mg/g{creat} — ABNORMAL HIGH (ref 0–29)
Microalbumin, Urine: 94.4 ug/mL

## 2023-04-15 LAB — PSA: Prostate Specific Ag, Serum: 0.7 ng/mL (ref 0.0–4.0)

## 2023-04-15 LAB — TSH: TSH: 1.01 u[IU]/mL (ref 0.450–4.500)

## 2023-04-15 LAB — URIC ACID: Uric Acid: 4.5 mg/dL (ref 3.8–8.4)

## 2023-04-19 ENCOUNTER — Other Ambulatory Visit (INDEPENDENT_AMBULATORY_CARE_PROVIDER_SITE_OTHER): Payer: Medicare Other

## 2023-04-19 DIAGNOSIS — Z23 Encounter for immunization: Secondary | ICD-10-CM

## 2023-04-20 DIAGNOSIS — H43811 Vitreous degeneration, right eye: Secondary | ICD-10-CM | POA: Diagnosis not present

## 2023-04-20 DIAGNOSIS — H40013 Open angle with borderline findings, low risk, bilateral: Secondary | ICD-10-CM | POA: Diagnosis not present

## 2023-04-20 DIAGNOSIS — H25811 Combined forms of age-related cataract, right eye: Secondary | ICD-10-CM | POA: Diagnosis not present

## 2023-04-20 DIAGNOSIS — E119 Type 2 diabetes mellitus without complications: Secondary | ICD-10-CM | POA: Diagnosis not present

## 2023-04-20 LAB — HM DIABETES EYE EXAM

## 2023-04-27 ENCOUNTER — Encounter: Payer: Self-pay | Admitting: Family Medicine

## 2023-04-28 ENCOUNTER — Encounter: Payer: Self-pay | Admitting: *Deleted

## 2023-05-06 ENCOUNTER — Other Ambulatory Visit: Payer: Self-pay | Admitting: Family Medicine

## 2023-05-06 NOTE — Telephone Encounter (Signed)
Looks like they are trying to change to 90d.  I wrote for #30 in September.  He rarely uses this (just prn flares), so #30 is appropriate. I do not want it changed to 90d

## 2023-06-09 ENCOUNTER — Ambulatory Visit (HOSPITAL_COMMUNITY): Payer: Medicare Other | Attending: Internal Medicine

## 2023-06-09 DIAGNOSIS — I340 Nonrheumatic mitral (valve) insufficiency: Secondary | ICD-10-CM

## 2023-06-09 DIAGNOSIS — I77819 Aortic ectasia, unspecified site: Secondary | ICD-10-CM

## 2023-06-09 DIAGNOSIS — I351 Nonrheumatic aortic (valve) insufficiency: Secondary | ICD-10-CM

## 2023-06-09 LAB — ECHOCARDIOGRAM COMPLETE
Area-P 1/2: 2.4 cm2
P 1/2 time: 511 ms
S' Lateral: 3.9 cm

## 2023-06-14 NOTE — Progress Notes (Signed)
Cardiology Office Note:  .   Date:  06/17/2023  ID:  Brett Johnson, DOB [birth year], MRN [id number] PCP: Joselyn Arrow, MD  [facility] Providers Cardiologist:  Reatha Harps, MD { History of Present Illness: .   Brett Johnson is a 72 y.o. male with history of PAC/PVC, AI who presents for follow-up.   History of Present Illness   Brett Johnson, a 72 year old male with a history of PACs, PVCs, hypertension, and aortic regurgitation, presents for a routine follow-up. The patient was evaluated several months ago for PVCs, which were asymptomatic. The patient's heart function has remained stable, as evidenced by an echocardiogram. The patient denies experiencing any chest pain or trouble breathing and maintains an active lifestyle, including swimming and attending fitness classes. The patient also has a history of diabetes and is on Lipitor for cholesterol management. The patient's blood pressure was slightly elevated during the visit, but home readings have generally been within normal limits. The patient has experienced episodes of severe itching, lightheadedness, and shortness of breath in the past, which led to an ER visit where AFib was initially suspected but later ruled out. The patient also reports a history of occasional dizziness at night.          Problem List PAC/PVC -PAC 5% burden; PVC 3.9% burden 06/2022 HTN Aortic regurgitation  -mild 06/2023 4. HLD -T chol 115, HDL 55, LDL 41, TG 106 5. DM -A1c 6.7    ROS: All other ROS reviewed and negative. Pertinent positives noted in the HPI.     Studies Reviewed: Marland Kitchen   EKG Interpretation Date/Time:  Friday June 17 2023 13:13:27 EST Ventricular Rate:  70 PR Interval:  140 QRS Duration:  92 QT Interval:  404 QTC Calculation: 436 R Axis:   -28  Text Interpretation: Normal sinus rhythm Minimal voltage criteria for LVH, may be normal variant ( R in aVL ) Confirmed by Lennie Odor [phone]) on 06/17/2023  1:16:57 PM   TTE 06/09/2023 1. Left ventricular ejection fraction, by estimation, is 60 to 65%. Left  ventricular ejection fraction by 3D volume is 62 %. The left ventricle has  normal function. The left ventricle has no regional wall motion  abnormalities. There is moderate left  ventricular hypertrophy. Left ventricular diastolic parameters are  consistent with Grade I diastolic dysfunction (impaired relaxation). The  average left ventricular global longitudinal strain is -18.0 %. The global  longitudinal strain is normal.   2. Right ventricular systolic function is normal. The right ventricular  size is normal.   3. The mitral valve is abnormal. Trivial mitral valve regurgitation.   4. The aortic valve is tricuspid. Aortic valve regurgitation is mild.  Aortic valve sclerosis/calcification is present, without any evidence of  aortic stenosis. Aortic regurgitation PHT measures 511 msec.   5. Aortic dilatation noted. There is borderline dilatation of the aortic  root, measuring 39 mm. There is borderline dilatation of the ascending  aorta, measuring 38 mm.  Physical Exam:   VS:  BP (!) 158/60 (BP Location: Left Arm, Patient Position: Sitting, Cuff Size: Normal)   Pulse 70   Ht 5\' 8"  (1.727 m)   Wt 195 lb 6.4 oz (88.6 kg)   SpO2 96%   BMI 29.71 kg/m    Wt Readings from Last 3 Encounters:  06/17/23 195 lb 6.4 oz (88.6 kg)  04/14/23 194 lb 3.2 oz (88.1 kg)  09/16/22 193 lb 3.2 oz (87.6 kg)    GEN: Well nourished,  well developed in no acute distress NECK: No JVD; No carotid bruits CARDIAC: RRR, no murmurs, rubs, gallops RESPIRATORY:  Clear to auscultation without rales, wheezing or rhonchi  ABDOMEN: Soft, non-tender, non-distended EXTREMITIES:  No edema; No deformity  ASSESSMENT AND PLAN: .   Assessment and Plan    Premature Ventricular Contractions (PVCs) Asymptomatic with low burden of PVCs. No impact on heart function. -Continue to monitor for symptoms such as shortness of  breath or palpitations.  Aortic Regurgitation Mild and stable over the past year. -Repeat echocardiogram in two years.  Hypertension Elevated blood pressure noted during visit, but home readings mostly within target range. -Advise daily blood pressure monitoring with a goal of less than 140/90. -Adjust medication if readings are consistently above target.  Hyperlipidemia Well controlled on Lipitor. -Continue current medication regimen.  Type 2 Diabetes Mellitus Well controlled. -Continue current management and monitor A1c.  Follow-up Annual visit or sooner if needed.              Follow-up: Return in about 1 year (around 06/16/2024).  Time Spent with Patient: I have spent a total of 25 minutes caring for this patient today face to face, ordering and reviewing labs/tests, reviewing prior records/medical history, examining the patient, establishing an assessment and plan, communicating results/findings to the patient/family, and documenting in the medical record.   Signed, Brett Johnson. Flora Lipps, MD, [facility]  [facility]  [address] [place], [place] [address] [phone]  1:36 PM

## 2023-06-17 ENCOUNTER — Encounter: Payer: Self-pay | Admitting: Cardiovascular Disease

## 2023-06-17 ENCOUNTER — Ambulatory Visit: Payer: Medicare Other | Attending: Cardiovascular Disease | Admitting: Cardiovascular Disease

## 2023-06-17 VITALS — BP 158/60 | HR 70 | Ht 68.0 in | Wt 195.4 lb

## 2023-06-17 DIAGNOSIS — I491 Atrial premature depolarization: Secondary | ICD-10-CM

## 2023-06-17 DIAGNOSIS — I351 Nonrheumatic aortic (valve) insufficiency: Secondary | ICD-10-CM

## 2023-06-17 DIAGNOSIS — I493 Ventricular premature depolarization: Secondary | ICD-10-CM

## 2023-06-17 DIAGNOSIS — R002 Palpitations: Secondary | ICD-10-CM

## 2023-06-17 NOTE — Patient Instructions (Addendum)
Medication Instructions:  No changes    *If you need a refill on your cardiac medications before your next appointment, please call your pharmacy*   Lab Work: None    If you have labs (blood work) drawn today and your tests are completely normal, you will receive your results only by: MyChart Message (if you have MyChart) OR A paper copy in the mail If you have any lab test that is abnormal or we need to change your treatment, we will call you to review the results.   Testing/Procedures: None    Follow-Up: At [facility], you and your health needs are our priority.  As part of our continuing mission to provide you with exceptional heart care, we have created designated Provider Care Teams.  These Care Teams include your primary Cardiologist (physician) and Advanced Practice Providers (APPs -  Physician Assistants and Nurse Practitioners) who all work together to provide you with the care you need, when you need it.  We recommend signing up for the patient portal called "MyChart".  Sign up information is provided on this After Visit Summary.  MyChart is used to connect with patients for Virtual Visits (Telemedicine).  Patients are able to view lab/test results, encounter notes, upcoming appointments, etc.  Non-urgent messages can be sent to your provider as well.   To learn more about what you can do with MyChart, go to [email or web address].    Your next appointment:   1 year(s)  The format for your next appointment:   In Person  Provider:   Reatha Harps, MD    Other Instructions Check blood pressures daily and keep a log of readings. Goal = blood pressures < 140/90

## 2023-06-28 DIAGNOSIS — H2511 Age-related nuclear cataract, right eye: Secondary | ICD-10-CM | POA: Diagnosis not present

## 2023-06-28 HISTORY — PX: CATARACT EXTRACTION: SUR2

## 2023-07-05 DIAGNOSIS — H25812 Combined forms of age-related cataract, left eye: Secondary | ICD-10-CM | POA: Diagnosis not present

## 2023-07-05 DIAGNOSIS — Z961 Presence of intraocular lens: Secondary | ICD-10-CM | POA: Diagnosis not present

## 2023-07-12 DIAGNOSIS — H2512 Age-related nuclear cataract, left eye: Secondary | ICD-10-CM | POA: Diagnosis not present

## 2023-07-12 HISTORY — PX: CATARACT EXTRACTION: SUR2

## 2023-08-07 ENCOUNTER — Encounter: Payer: Self-pay | Admitting: Family Medicine

## 2023-08-07 ENCOUNTER — Other Ambulatory Visit: Payer: Self-pay | Admitting: Family Medicine

## 2023-08-07 MED ORDER — EPINEPHRINE 0.3 MG/0.3ML IJ SOAJ: 0.3000 mg | INTRAMUSCULAR | 0 refills | Status: DC | PRN

## 2023-08-30 ENCOUNTER — Telehealth: Payer: Self-pay | Admitting: Family Medicine

## 2023-08-30 ENCOUNTER — Encounter: Payer: Self-pay | Admitting: Family Medicine

## 2023-08-30 NOTE — Telephone Encounter (Signed)
Pt called and wanted me to let you know that there is more to his message but he had issues with system and will send later

## 2023-08-31 ENCOUNTER — Encounter: Payer: Self-pay | Admitting: Family Medicine

## 2023-08-31 ENCOUNTER — Telehealth: Payer: Self-pay | Admitting: Family Medicine

## 2023-08-31 NOTE — Telephone Encounter (Signed)
Visit was recommended to discuss episode, do exam, and consider possible referral (determine which type of referral would be appropriate, based on history and physical).  There needs to be back and forth conversation, which needs to take place at a visit, not over MyChart.

## 2023-08-31 NOTE — Telephone Encounter (Signed)
Patient called in about My Chart message he sent yesterday , He was not able to send full message because of system issues and has tried again today and is not able to. I offered to send message back for him, he advised it would be a long message and also stated he got message back from [place] yesterday and advised him to schedule an appointment. I offered to schedule apointment, pt did not feel that would accomplish anything and just wanted to get additional info to Dr Lynelle Doctor. I placed call on hold and consulted with Dr Lynelle Doctor and Suzette Battiest and Dr Lynelle Doctor stated pt needs appointment to discuss, that we can't have conversations thru My Chart. I advised pt that he does indeed need appointment and he asked if he could just speak to [place], told pt that I just spoke to [place] and Dr Lynelle Doctor about this and pt ended phone call

## 2023-09-06 ENCOUNTER — Telehealth: Payer: Self-pay

## 2023-09-06 ENCOUNTER — Other Ambulatory Visit (HOSPITAL_COMMUNITY): Payer: Self-pay

## 2023-09-06 ENCOUNTER — Telehealth: Payer: Self-pay | Admitting: Family Medicine

## 2023-09-06 NOTE — Telephone Encounter (Signed)
Pt is needing a PA on omega-3 acid ethyl esters (LOVAZA) 1 g capsule

## 2023-09-06 NOTE — Telephone Encounter (Signed)
 Pharmacy Patient Advocate Encounter   Received notification from [facility] that Prior Authorization for Omega-3-acid  Ethyl Esters 1GM capsules  has been APPROVED from 2.4.25 to 2.4.26. Ran test claim, Copay is $30.00. This test claim was processed through [facility]- copay amounts may vary at other pharmacies due to pharmacy/plan contracts, or as the patient moves through the different stages of their insurance plan.    PA #/Case ID/Reference #: (Key: [id number])

## 2023-09-06 NOTE — Telephone Encounter (Signed)
 Pharmacy Patient Advocate Encounter   Received notification from Physician's Office that prior authorization for Omega-3-acid  Ethyl Esters 1GM capsules is required/requested.   Insurance verification completed.   The patient is insured through [facility] .   Per test claim: PA required; PA submitted to above mentioned insurance via CoverMyMeds Key/confirmation #/EOC  (Key: [id number])  Status is pending

## 2023-09-07 NOTE — Progress Notes (Signed)
 Chief Complaint  Patient presents with   Consult    Patient is here for allergic reaction. Did not check BS during episode. His wife did check BP and it was aroubf 145/65. Took vitamins, milk thistle, cinnamon that day. No sildenafil  in that 24hr period. Had consumed alcohol a few hours prior to the event that took place at 3am.     Patient presents to discuss another episode he had. He sent the following message on 1/28--  Last night around 3:30 AM I had another incident that started with extreme itching and then shortness of breath, forceful coughing, and dizziness. This is the 4th incident in the last several years. Three have been at night. The one in Sept 23 was around 9:00 AM That was in [place]. I ended up at Urgent Care and then the ER It was that incident that resulted in your referral to Dr Hobart. Based on the ER records and her exam she did not think it was cardiac and suggested it was possibly a histamine dump. Last night's episode was the worst of the 4 incidents.  Message sent by wife 1/29: Brett Johnson woke me up after he took a Benadryl. I observed the same symptoms that he described. In addition, as I was assisting him from the bathroom to the bed, he became quite dizzy and started to fall. I assisted him to a seated position on the floor. At that point, he appeared to faint and was mostly unconscious. This lasted for several minutes. If I spoke to him loudly he would open his eyes and try to speak but could not. His breathing was not labored but his skin was clammy. I started a call to 911. While speaking with them he became responsive and said he felt better. We told EMS not respond. I was able to get him back into bed and he was able to rest comfortably.  Message sent 1/29 by patient: I am disappointed that I have not been able to discuss with Dr Randol. She has been told about the other incidents and in fact I saw her just a few days after the incident in September 2023. She  really had no treatment options at that time and referred me to a cardiologist. I was hoping based on what we had discussed earlier and the info I have sent she might refer me to someone without having to come into your office. Apparently that is not going to happen.  Patient irritable about having to come for an office visit today.  We reviewed his recent episode, after reviewing prior episode (detailed below)  He had woken up hungry, went to kitchen, and started itching (as he started to eat cheese)--started in scalp, then extremities, spread all over. He noticed some labored breathing, he was coughing, heard a crackle in his breathing. He took Benadryl, and woke up his wife. He got very dizzy, possible syncope.  He was looking at her, trying to talk but wasn't able to. She called 911. He became alert/conversive while still on the phone with them. He refused treatment, so paramedics didn't come   This past Saturday he had another episode, not as severe, and different. Started having recurrent itching while sitting at his computer in the afternoon. Took Allegra. Itching didn't get as bad, had some cough and raspy breathing. Laid down, though didn't really feel lightheaded. He later noticed some swelling of his lips (first time with this has occurred with the itching reaction). They drove to [employer]  by the time they got there it was subsiding, so they didn't go in, waited in the parking lot x 30 mins, and continued to improve. He also recalled that he noted some welts on his left abdomen.  He has been taking some diclofenac  again for some foot pain recently.  He is using a current rx, not an out-dated one like the incident in 04/2022.  He had forgotten about the use of diclofenac  in 04/2022, until episode details reviewed today.  We reviewed cardiac work-up and recent visit (see below).  He states he no longer feels woozy in the middle of the night when he gets up to the bathroom. Compliant  with losartan , denies side effects. Denies headaches, chest pain, edema.   BP usually up to 140/70's, only once over 140  BP Readings from Last 3 Encounters:  09/08/23 132/84  06/17/23 (!) 158/60  04/14/23 132/86    Notes reviewed from last episode (f/u from ER visit/med check in 04/2022): He went to [facility] 9/19 with itching, shortness of breath, dry cough. They thought he had new onset afib, sent him to ER.  EKG in ER showed ectopic atrial rhythm with PVC's. Evaluation included normal troponin, CBC, PT/INR, and negative RSV/COVID/flu. Normal CXR (hypoventilatory) He was advised to f/u with cardiologist within a week.   Itching was mainly on extremities. . Was getting ready to leave [place].  Slightly LH, noticed breathing was heavier.  Slight cough, caught a little like some chest congestion.  He felt slightly woozy when standing to check in at [facility]. Hadn't had much to drink that morning (was 8am, only just had coffee).   Was having some R arch pain, took diclofenac  (expired). Itching started 20 mins later. Never had reaction to diclofenac  in the past. Itching resolved without treatment--other than taking an allegra when it started.  2 prior episodes of itching (started in bed the other times).  He is under the care of Dr. Barbaraann, last seen in November for f/u on history of PACs, PVCs, hypertension, and aortic regurgitation. Per Dr. Rosslyn notes--The patient has experienced episodes of severe itching, lightheadedness, and shortness of breath in the past, which led to an ER visit where AFib was initially suspected but later ruled out. The patient also reports a history of occasional dizziness at night. Zio patch 06/2022 showed some NSVT and SVT, no atrial fib.    He had echo 06/2023 with normal EF (60-65%), moderate LVH, grade 1 diastolic dysfunction. Mild aortic regurgitation. Aortic dilation noted, borderline dilatation of aortic root. It was recommended to repeat echo in 2 years.   BP at  cardiology visit was elevated, but reportedly lower at home. See below.  Last visit with me was in 04/2023, routine med check. DM--his A1c was 6.7% on metformin  2000 mg daily. He reported that he eats healthy when home, not as good on vacation but tries. Doesn't check sugars on vacation. Eats more ice cream when with the grandkids.   He has microalbuminuria. Serum Cr and eGFR have been normal Component Ref Range & Units (hover) 4 mo ago (04/14/23) 1 yr ago (04/15/22) 2 yr ago (03/30/21) 3 yr ago (03/26/20) 4 yr ago (07/17/19) 4 yr ago (01/04/19) 6 yr ago (09/08/17)  Creatinine, Urine 127.9 124.5 78.2 64.1 165.2 39.1 208.4  Microalbumin, Urine 94.4 80.2 46.1 100.6 135.6 168.5 44.2  Microalb/Creat Ratio 74 High  64 High  CM 59 High  CM 157 High  CM 82 High  CM 431 High  CM  21.2 R, CM      PMH, PSH, SH reviewed  Outpatient Encounter Medications as of 09/08/2023  Medication Sig Note   allopurinol  (ZYLOPRIM ) 300 MG tablet Take 1 tablet (300 mg total) by mouth daily.    atorvastatin  (LIPITOR) 40 MG tablet Take 1 tablet (40 mg total) by mouth daily.    carbamazepine  (TEGRETOL  XR) 100 MG 12 hr tablet Take 1 tablet (100 mg total) by mouth daily.    cholecalciferol (VITAMIN D ) 1000 units tablet Take 1,000 Units by mouth daily.    CINNAMON PO Take by mouth daily.    diclofenac  (VOLTAREN ) 75 MG EC tablet Take 1 tablet (75 mg total) by mouth 2 (two) times daily as needed. 09/08/2023: Took one this am   fexofenadine (ALLEGRA) 180 MG tablet Take 180 mg by mouth daily.    losartan  (COZAAR ) 25 MG tablet Take 1 tablet (25 mg total) by mouth daily.    metFORMIN  (GLUCOPHAGE -XR) 500 MG 24 hr tablet TAKE 2 TABLET BEFORE BREAKFAST, 2 TABLETS BEFORE DINNER    MILK THISTLE PO Take by mouth daily.     Multiple Vitamin (MULTIVITAMIN) tablet Take 1 tablet by mouth daily.    omega-3 acid ethyl esters (LOVAZA ) 1 g capsule Take 2 capsules (2 g total) by mouth 2 (two) times daily.    carbamazepine  (TEGRETOL ) 100 MG chewable  tablet CHEW 1 TABLET AS DIRECTED/AS NEEDED FOR TRIGEMINAL NEURALGIA FLARES. (Patient not taking: Reported on 09/08/2023) 09/08/2023: As needed   EPINEPHrine  0.3 mg/0.3 mL IJ SOAJ injection Inject 0.3 mg into the muscle as needed for anaphylaxis. (Patient not taking: Reported on 09/08/2023)    naproxen sodium (ANAPROX) 220 MG tablet Take 220 mg by mouth 2 (two) times daily with a meal. (Patient not taking: Reported on 09/08/2023) 09/08/2023: As needed   valACYclovir  (VALTREX ) 1000 MG tablet Take 2 tablets at onset of cold sore and repeat once in 12 hours (4 tablets/course) (Patient not taking: Reported on 09/08/2023) 09/08/2023: As needed   [DISCONTINUED] EPINEPHrine  (NEFFY ) 2 MG/0.1ML SOLN Place 2 mg into the nose as directed. (Patient not taking: Reported on 09/08/2023)    [DISCONTINUED] Melatonin 3 MG TABS Take 3 mg by mouth as needed. (Patient not taking: Reported on 06/17/2023) 04/14/2023: As needed, periodlically     No facility-administered encounter medications on file as of 09/08/2023.   Allergies  Allergen Reactions   Minocycline Anaphylaxis   Penicillins Hives    ROS:  episodes of itching, dizziness and shortness of breath, as well as lip swelling and possible hives, per HPI. Denies fever, chills, URI symptoms, headaches, chest pain, palpitations.  Denies nausea, vomiting, bowel changes, urinary complaints, bleeding, bruising, rash.  No joint pains, moods are normal.  See HPI.    PHYSICAL EXAM:  BP 132/84   Pulse 72   Ht 5' 8 (1.727 m)   Wt 194 lb (88 kg)   BMI 29.50 kg/m   Wt Readings from Last 3 Encounters:  09/08/23 194 lb (88 kg)  06/17/23 195 lb 6.4 oz (88.6 kg)  04/14/23 194 lb 3.2 oz (88.1 kg)    Well-developed male, in no distress HEENT: conjunctiva and sclera are clear, EOMI. OP clear, lips appear normal Neck: no lymphadenopathy, thryomegaly or bruit Heart: regular rate and rhythm.  Occasional ectopy. 2/6 SE murmur noted at RUSB Lungs: clear bilaterally Abdomen: soft,  nontender, no mass Extremities: no edema, 2+ pulses Skin: normal turgor, no rash.  Psych: normal mood, affect, hygiene and grooming. Somewhat irritable   ASSESSMENT/PLAN:  Allergic reaction, subsequent encounter - pruritis with cough/shortness of breath dizziness, and recent lip swelling and possible hives. Refer to allergist. Supportive measures discussed. - Plan: CBC with Differential/Platelet, CMP14+EGFR, EPINEPHrine  (EPIPEN  2-PAK) 0.3 mg/0.3 mL IJ SOAJ injection, Ambulatory referral to Allergy   Type 2 diabetes mellitus with microalbuminuria, without long-term current use of insulin (HCC) - sugars above goal per pt. A1c with labs.  Has wellness visit in a couple of weeks. - Plan: CMP14+EGFR, Hemoglobin A1c  Hypertension associated with diabetes (HCC) - borderline BP's. Cont losartan , low Na diet. Has med check in 2 weeks  Microalbuminuria - has had normal GFR. Recheck; consider Kerendia if decreased GFR.  Nonfasting today  F/u 2/26 for AWV as scheduled

## 2023-09-08 ENCOUNTER — Ambulatory Visit: Payer: Medicare Other | Admitting: Family Medicine

## 2023-09-08 ENCOUNTER — Encounter: Payer: Self-pay | Admitting: *Deleted

## 2023-09-08 ENCOUNTER — Encounter: Payer: Self-pay | Admitting: Family Medicine

## 2023-09-08 VITALS — BP 132/84 | HR 72 | Ht 68.0 in | Wt 194.0 lb

## 2023-09-08 DIAGNOSIS — T7840XD Allergy, unspecified, subsequent encounter: Secondary | ICD-10-CM

## 2023-09-08 DIAGNOSIS — E1129 Type 2 diabetes mellitus with other diabetic kidney complication: Secondary | ICD-10-CM

## 2023-09-08 DIAGNOSIS — I152 Hypertension secondary to endocrine disorders: Secondary | ICD-10-CM

## 2023-09-08 DIAGNOSIS — R809 Proteinuria, unspecified: Secondary | ICD-10-CM

## 2023-09-08 DIAGNOSIS — E1159 Type 2 diabetes mellitus with other circulatory complications: Secondary | ICD-10-CM | POA: Diagnosis not present

## 2023-09-08 LAB — CBC WITH DIFFERENTIAL/PLATELET
Basophils Absolute: 0 10*3/uL (ref 0.0–0.2)
Basos: 1 %
EOS (ABSOLUTE): 0.2 10*3/uL (ref 0.0–0.4)
Eos: 4 %
Hematocrit: 42.4 % (ref 37.5–51.0)
Hemoglobin: 14.7 g/dL (ref 13.0–17.7)
Immature Grans (Abs): 0 10*3/uL (ref 0.0–0.1)
Immature Granulocytes: 0 %
Lymphocytes Absolute: 0.9 10*3/uL (ref 0.7–3.1)
Lymphs: 19 %
MCH: 33 pg (ref 26.6–33.0)
MCHC: 34.7 g/dL (ref 31.5–35.7)
MCV: 95 fL (ref 79–97)
Monocytes Absolute: 0.7 10*3/uL (ref 0.1–0.9)
Monocytes: 15 %
Neutrophils Absolute: 2.8 10*3/uL (ref 1.4–7.0)
Neutrophils: 61 %
Platelets: 181 10*3/uL (ref 150–450)
RBC: 4.46 x10E6/uL (ref 4.14–5.80)
RDW: 12.8 % (ref 11.6–15.4)
WBC: 4.6 10*3/uL (ref 3.4–10.8)

## 2023-09-08 LAB — CMP14+EGFR
ALT: 38 [IU]/L (ref 0–44)
AST: 24 [IU]/L (ref 0–40)
Albumin: 4.6 g/dL (ref 3.8–4.8)
Alkaline Phosphatase: 59 [IU]/L (ref 44–121)
BUN/Creatinine Ratio: 18 (ref 10–24)
BUN: 17 mg/dL (ref 8–27)
Bilirubin Total: 0.6 mg/dL (ref 0.0–1.2)
CO2: 21 mmol/L (ref 20–29)
Calcium: 9.1 mg/dL (ref 8.6–10.2)
Chloride: 99 mmol/L (ref 96–106)
Creatinine, Ser: 0.92 mg/dL (ref 0.76–1.27)
Globulin, Total: 2 g/dL (ref 1.5–4.5)
Glucose: 158 mg/dL — ABNORMAL HIGH (ref 70–99)
Potassium: 4.5 mmol/L (ref 3.5–5.2)
Sodium: 139 mmol/L (ref 134–144)
Total Protein: 6.6 g/dL (ref 6.0–8.5)
eGFR: 89 mL/min/{1.73_m2} (ref >59)

## 2023-09-08 LAB — HEMOGLOBIN A1C
Est. average glucose Bld gHb Est-mCnc: 166 mg/dL
Hgb A1c MFr Bld: 7.4 % — ABNORMAL HIGH (ref 4.8–5.6)

## 2023-09-08 MED ORDER — EPINEPHRINE 0.3 MG/0.3ML IJ SOAJ
0.3000 mg | INTRAMUSCULAR | 0 refills | Status: AC | PRN
Start: 2023-09-08 — End: ?

## 2023-09-08 NOTE — Telephone Encounter (Signed)
 Pharmacy Patient Advocate Encounter   Received notification from [facility] that Prior Authorization for Omega-3-acid  Ethyl Esters 1GM capsules  has been APPROVED from 2.4.25 to 2.4.26. Ran test claim, Copay is $30.00. This test claim was processed through [facility]- copay amounts may vary at other pharmacies due to pharmacy/plan contracts, or as the patient moves through the different stages of their insurance plan.    PA #/Case ID/Reference #: (Key: [id number])

## 2023-09-08 NOTE — Patient Instructions (Addendum)
 Stop the diclofenac , since that is one commonality between the current issues and in 04/2022. I would use tylenol for pain, and ice the foot, do the heel stretches. You could try topical voltaren , which is the same ingredient, but topically--if it is this medication that is causing the problem you might see a rash, and know to entirely stop it. Other option is to avoid that, and see podiatrist ([facility], on [address]) or follow up with Dr. Cleatrice.  We are referring you to an allergist for further evaluation.     With next episode, take anthistamine (benadryl), famotidine (pepcid) 40 mg right away. Keep the epi-pen on hand, and remember to go to the ER if you use it.

## 2023-09-09 ENCOUNTER — Encounter: Payer: Self-pay | Admitting: Family Medicine

## 2023-09-11 NOTE — Progress Notes (Signed)
 New Patient Note  RE: Brett Johnson MRN: [id number] DOB: 03-07-1951 Date of Office Visit: 09/12/2023  Consult requested by: [name], MD Primary care provider: [name], MD  Chief Complaint: Establish Care and Allergic Reaction (Over 3-4 years, multiple reactions. Itchiness, shortness of breath, coughing, dizziness, raspy voice, lip swelling. Last reaction was roughly 2 weeks ago, unsure of the cause. Same symptoms as above. Did take some benadryl.)  History of Present Illness: I had the pleasure of seeing Brett Johnson for initial evaluation at the [facility] on 09/12/2023. He is a 73 y.o. male, who is referred here by [name], MD for the evaluation of allergic reactions.  Discussed the use of AI scribe software for clinical note transcription with the patient, who gave verbal consent to proceed.    Brett Johnson "Brett Johnson" is a 73 year old male with a history of allergic reactions who presents with recurrent episodes of itching, labored breathing, and dizziness.   He has been experiencing recurrent episodes of severe itching and labored breathing for approximately four years. Initially, these episodes occurred at night shortly after going to bed. He managed these episodes with Allegra and a topical cream which provided relief without the need for emergency care.  In September 2023, [place], he experienced an episode with symptoms of itching, wooziness, labored breathing, and cough. An EKG at urgent care suggested atrial fibrillation, but subsequent evaluation in the ER ruled out AFib. A full cardiac workup by a cardiologist confirmed no cardiac issues. He was advised to keep an EpiPen  available due to suspected histamine-related reactions.  Two weeks ago, he woke up feeling hungry. He may had some ham or cheese and he experienced another episode at night, characterized by itching, labored breathing, dizziness, and temporary loss of  consciouness. His wife considered calling 911, but the symptoms subsided without emergency intervention. A few days later, he had another episode after eating a wrap with leftover beef, experiencing itching, labored breathing, and lip swelling. He took Allegra, and the symptoms resolved after driving and waiting in a hospital parking lot.  He has a history of allergic reactions to penicillin and a past severe reaction to aspirin and minocycline, which resulted in anaphylaxis. He underwent allergy  testing in the 1970s, which indicated a mild reaction to grass.  In 2008 he underwent an aspirin challenge with no issues however he was premedicated with loratadine and famotidine. Subsequently he started taking aspirin 81mg  with no issues. Since then he stopped taking the aspirin as it was no longer recommended.   No known allergies to bee stings, insect stings, or seasonal allergies. No recent tick bites, changes in diet, or medication use that could explain the episodes.  His current medications include blood pressure medication, gout medication, metformin , and an EpiPen . He has a history of using Aleve and diclofenac  for heel pain. He was using some Voltaren  for the foot pain. He can't recall what he ate during each of the above episodes. He also is unclear about whether he took any NSAIDS during the above episodes.      09/08/2023 PCP visit: ""Last night around 3:30 AM I had another incident that started with extreme itching and then shortness of breath, forceful coughing, and dizziness. This is the 4th incident in the last several years. Three have been at night. The one in Sept 23 was around 9:00 AM That was in [place]. I ended up at Urgent Care and  then the ER It was that incident that resulted in your referral to Dr Ardell Beauvais. Based on the ER records and her exam she did not think it was cardiac and suggested it was possibly a histamine dump. Last night's episode was the worst of the 4 incidents."    Message sent by wife 1/29: "Brett Johnson woke me up after he took a Benadryl. I observed the same symptoms that he described. In addition, as I was assisting him from the bathroom to the bed, he became quite dizzy and started to fall. I assisted him to a seated position on the floor. At that point, he appeared to faint and was mostly unconscious. This lasted for several minutes. If I spoke to him loudly he would open his eyes and try to speak but could not. His breathing was not labored but his skin was clammy. I started a call to 911. While speaking with them he became responsive and said he felt better. We told EMS not respond. I was able to get him back into bed and he was able to rest comfortably."   Message sent 1/29 by patient: "I am disappointed that I have not been able to discuss with Dr Monnie Anthony. She has been told about the other incidents and in fact I saw her just a few days after the incident in September 2023. She really had no treatment options at that time and referred me to a cardiologist. I was hoping based on what we had discussed earlier and the info I have sent she might refer me to someone without having to come into your office. Apparently that is not going to happen."   Patient irritable about having to come for an office visit today.   We reviewed his recent episode, after reviewing prior episode (detailed below)   He had woken up hungry, went to kitchen, and started itching (as he started to eat cheese)--started in scalp, then extremities, spread all over. He noticed some labored breathing, he was coughing, heard a crackle in his breathing. He took Benadryl, and woke up his wife. He got very dizzy, possible syncope.  He was looking at her, trying to talk but wasn't able to. She called 911. He became alert/conversive while still on the phone with them. He refused treatment, so paramedics didn't come    This past Saturday he had another episode, not as severe, and different. Started  having recurrent itching while sitting at his computer in the afternoon. Took Allegra. Itching didn't get as bad, had some cough and raspy breathing. Laid down, though didn't really feel lightheaded. He later noticed some swelling of his lips (first time with this has occurred with the itching reaction). They drove to [facility],and by the time they got there it was subsiding, so they didn't go in, waited in the parking lot x 30 mins, and continued to improve. He also recalled that he noted some welts on his left abdomen.   He has been taking some diclofenac  again for some foot pain recently.  He is using a current rx, not an out-dated one like the incident in 04/2022.  He had forgotten about the use of diclofenac  in 04/2022, until episode details reviewed today."  Assessment and Plan: Lamell is a 73 y.o. male with: Allergic reaction, subsequent encounter Multiple drug allergies Episodes of generalized itching, labored breathing, and coughing with some episodes associated with lightheadedness, dizziness, and loss of consciousness. Episodes have been increasing in frequency and severity. No clear triggers identified.  History of penicillin allergy  and possible NSAID allergy . Passed aspirin challenge and tolerates aspirin 81mg  daily with no issues however stopped taking it as it was no longer recommended. Unclear if he took NSAIDS during above episodes and 2 of the times there was mammalian meat exposure.  Etiology unclear but I'm concerned about NSAID allergy  and/or alpha-gal allergy  given clinical history. Keep track of episodes.  Write down what you had done/eaten during flares.  Avoid mammalian meat - no beef, pork or lamb. Avoid NSAIDs - Aleve, Voltaren  Take tylenol for pain and fevers.  Take allegra 180mg  once a day as a preventative measure. For mild symptoms you can take over the counter antihistamines such as Benadryl 1-2 tablets = 25-50mg  and monitor symptoms closely. If symptoms worsen or if you  have severe symptoms including breathing issues, throat closure, significant swelling, whole body hives, severe diarrhea and vomiting, lightheadedness then inject epinephrine  and seek immediate medical care afterwards. Emergency action plan given. Get bloodwork.  Return in about 4 weeks (around 10/10/2023).  No orders of the defined types were placed in this encounter.  Lab Orders         Alpha-Gal Panel         Food Allergy  Profile         Chronic Urticaria         Tryptase         Sedimentation rate         Thyroid  Cascade Profile         ANA, IFA (with reflex)         C3 and C4      Other allergy  screening: Asthma: no Rhino conjunctivitis: no Medication allergy : yes Penicillin - hives Aspirin and minocycline - anaphylactic reaction with facial swelling, throat swelling. Patient had drug challenge to aspirin which he passed.   Hymenoptera allergy : no Urticaria: no Eczema:no History of recurrent infections suggestive of immunodeficency: no  Diagnostics: None.   Past Medical History: Patient Active Problem List   Diagnosis Date Noted   Diverticulosis 02/28/2021   Adenomatous colon polyp 02/28/2021   Senile purpura (HCC) 08/28/2020   Mixed hyperlipidemia due to type 2 diabetes mellitus (HCC) 04/28/2016   Type 2 diabetes mellitus with microalbuminuria, without long-term current use of insulin (HCC) 04/28/2016   Elevated uric acid in blood 04/27/2016   Hypertriglyceridemia 04/27/2016   Acute gout 04/27/2016   Vitamin D  deficiency 03/23/2016   Pure hypercholesterolemia 03/23/2016   Annual physical exam 11/13/2012   Trigeminal neuralgia 11/13/2012   Elevated BP without diagnosis of hypertension 11/13/2012   Glucose intolerance (impaired glucose tolerance) 11/13/2012   Basal cell carcinoma 11/13/2012   Past Medical History:  Diagnosis Date   Allergy     spring with pollen   Angio-edema    Cancer (HCC)    Basal cell cancer; followed annually by dermatology/[facility].    Cataract    forming   Diabetes (HCC)    Diverticulosis    Glucose intolerance (impaired glucose tolerance)    Gout 08/02/2012   Herpes labialis    Hyperlipidemia    Hypertension    Internal hemorrhoids    Squamous cell carcinoma of skin 2017   right arm, removed by Dr. [place]   Trigeminal neuralgia of left side of face    since childhood. controlled by tegretol  (has had it on both sides in the past)   Tubular adenoma of colon    Urticaria    Past Surgical History: Past Surgical History:  Procedure Laterality Date  CATARACT EXTRACTION Right 06/28/2023   Dr. Devin Foerster   CATARACT EXTRACTION Left 07/12/2023   Dr. Hope Ly   COLONOSCOPY  11/01/2010   normal; repeat in 10 years.  [facility].   Medication List:  Current Outpatient Medications  Medication Sig Dispense Refill   allopurinol  (ZYLOPRIM ) 300 MG tablet Take 1 tablet (300 mg total) by mouth daily. 90 tablet 3   atorvastatin  (LIPITOR) 40 MG tablet Take 1 tablet (40 mg total) by mouth daily. 90 tablet 3   carbamazepine  (TEGRETOL  XR) 100 MG 12 hr tablet Take 1 tablet (100 mg total) by mouth daily. 90 tablet 1   cholecalciferol (VITAMIN D ) 1000 units tablet Take 1,000 Units by mouth daily.     CINNAMON PO Take by mouth daily.     EPINEPHrine  (EPIPEN  2-PAK) 0.3 mg/0.3 mL IJ SOAJ injection Inject 0.3 mg into the muscle as needed for anaphylaxis. 2 each 0   fexofenadine (ALLEGRA) 180 MG tablet Take 180 mg by mouth daily.     losartan  (COZAAR ) 25 MG tablet Take 1 tablet (25 mg total) by mouth daily. 90 tablet 1   metFORMIN  (GLUCOPHAGE -XR) 500 MG 24 hr tablet TAKE 2 TABLET BEFORE BREAKFAST, 2 TABLETS BEFORE DINNER 360 tablet 1   MILK THISTLE PO Take by mouth daily.      Multiple Vitamin (MULTIVITAMIN) tablet Take 1 tablet by mouth daily.     omega-3 acid ethyl esters (LOVAZA ) 1 g capsule Take 2 capsules (2 g total) by mouth 2 (two) times daily. 360 capsule 3   XIIDRA 5 % SOLN Apply 1 drop to eye 2 (two) times daily.      No current facility-administered medications for this visit.   Allergies: Allergies  Allergen Reactions   Minocycline Anaphylaxis   Nsaids    Penicillins Hives   Social History: Social History   Socioeconomic History   Marital status: Married    Spouse name: Not on file   Number of children: 1   Years of education: Not on file   Highest education level: Not on file  Occupational History   Not on file  Tobacco Use   Smoking status: Never   Smokeless tobacco: Never  Vaping Use   Vaping status: Never Used  Substance and Sexual Activity   Alcohol use: Yes    Alcohol/week: 14.0 standard drinks of alcohol    Types: 14 Standard drinks or equivalent per week    Comment: 2 glasses of wine/night, with 1 cocktail prior to dinner a few times/week   Drug use: No   Sexual activity: Yes    Partners: Female  Other Topics Concern   Not on file  Social History Narrative   Marital status:  Married (6/78)      Children:  1 daughter; 2 grandchildren in [place]      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  40 -50 hours per week, through Spring 2018.   Wife also retired spring 2018      Tobacco: none      Alcohol:  2-3 glasses of wine daily, occasional cocktail      Drugs: none      Exercising: walking 30-60 minutes daily.      Seatbelt: 100%      Guns:  Loaded partially secured; no children in house.      Sunscreen:  SPF 30-50.      Also has homes at [employer] [place] and in [place].  Updated 09/2022   Social Drivers of Health   Financial Resource Strain: Low Risk  (04/14/2023)   Overall Financial Resource Strain (CARDIA)    Difficulty of Paying Living Expenses: Not very hard  Food Insecurity: No Food Insecurity (04/14/2023)   Hunger Vital Sign    Worried About Running Out of Food in the Last Year: Never true    Ran Out of Food in the Last Year: Never true  Transportation Needs: No Transportation Needs (04/14/2023)    PRAPARE - Administrator, Civil Service (Medical): No    Lack of Transportation (Non-Medical): No  Physical Activity: Sufficiently Active (04/14/2023)   Exercise Vital Sign    Days of Exercise per Week: 6 days    Minutes of Exercise per Session: 60 min  Stress: Stress Concern Present (04/14/2023)   [employer] of Occupational Health - Occupational Stress Questionnaire    Feeling of Stress : To some extent  Social Connections: Socially Integrated (04/14/2023)   Social Connection and Isolation Panel [NHANES]    Frequency of Communication with Friends and Family: More than three times a week    Frequency of Social Gatherings with Friends and Family: More than three times a week    Attends Religious Services: 1 to 4 times per year    Active Member of [employer] or Organizations: Yes    Attends Engineer, structural: More than 4 times per year    Marital Status: Married   Lives in a house. Smoking: denies Occupation: retired  Landscape architect HistorySurveyor, minerals in the house: no Engineer, civil (consulting) in the family room: no Carpet in the bedroom: no Heating: gas and electric Cooling: central Pet: no  Family History: Family History  Problem Relation Age of Onset   Angioedema Mother    Pulmonary embolism Mother    Colon polyps Father    Diabetes Father    Arthritis Father    Heart disease Father        valve replacement.   Hyperlipidemia Father    Cancer Father        skin   Parkinson's disease Father    Diabetes Brother    Hypertension Brother    Hyperlipidemia Brother    Cancer Brother        skin   Heart disease Maternal Grandfather    Kidney Stones Daughter    Colon cancer Neg Hx    Esophageal cancer Neg Hx    Rectal cancer Neg Hx    Stomach cancer Neg Hx    Review of Systems  Constitutional:  Negative for appetite change, chills, fever and unexpected weight change.  HENT:  Negative for congestion and rhinorrhea.   Eyes:  Negative for itching.   Respiratory:  Negative for cough, chest tightness, shortness of breath and wheezing.   Cardiovascular:  Negative for chest pain.  Gastrointestinal:  Negative for abdominal pain.  Genitourinary:  Negative for difficulty urinating.  Skin:  Negative for rash.  Neurological:  Negative for headaches.    Objective: BP 108/80 (BP Location: Right Arm, Patient Position: Sitting, Cuff Size: Normal)   Pulse 75   Temp 98.4 F (36.9 C) (Temporal)   Resp 17   Ht 5' 7.25" (1.708 m)   Wt 195 lb 1.6 oz (88.5 kg)   SpO2 96%   BMI 30.33 kg/m  Body mass index is 30.33 kg/m. Physical Exam Vitals and nursing note reviewed.  Constitutional:      Appearance: Normal appearance. He is well-developed.  HENT:  Head: Normocephalic and atraumatic.     Right Ear: Tympanic membrane and external ear normal.     Left Ear: Tympanic membrane and external ear normal.     Nose: Nose normal.     Mouth/Throat:     Mouth: Mucous membranes are moist.     Pharynx: Oropharynx is clear.  Eyes:     Conjunctiva/sclera: Conjunctivae normal.  Cardiovascular:     Rate and Rhythm: Normal rate and regular rhythm.     Heart sounds: Normal heart sounds. No murmur heard.    No friction rub. No gallop.  Pulmonary:     Effort: Pulmonary effort is normal.     Breath sounds: Normal breath sounds. No wheezing, rhonchi or rales.  Musculoskeletal:     Cervical back: Neck supple.  Skin:    General: Skin is warm.     Findings: No rash.  Neurological:     Mental Status: He is alert and oriented to person, place, and time.  Psychiatric:        Behavior: Behavior normal.   The plan was reviewed with the patient/family, and all questions/concerned were addressed.  It was my pleasure to see Raemon today and participate in his care. Please feel free to contact me with any questions or concerns.  Sincerely,  Eudelia Hero, DO Allergy  & Immunology  [facility]  [place] office:  [phone] [facility] office: [phone]

## 2023-09-12 ENCOUNTER — Other Ambulatory Visit: Payer: Self-pay

## 2023-09-12 ENCOUNTER — Encounter: Payer: Self-pay | Admitting: Allergy

## 2023-09-12 ENCOUNTER — Ambulatory Visit (INDEPENDENT_AMBULATORY_CARE_PROVIDER_SITE_OTHER): Payer: Medicare Other | Admitting: Allergy

## 2023-09-12 VITALS — BP 108/80 | HR 75 | Temp 98.4°F | Resp 17 | Ht 67.25 in | Wt 195.1 lb

## 2023-09-12 DIAGNOSIS — Z889 Allergy status to unspecified drugs, medicaments and biological substances status: Secondary | ICD-10-CM | POA: Diagnosis not present

## 2023-09-12 DIAGNOSIS — Z888 Allergy status to other drugs, medicaments and biological substances status: Secondary | ICD-10-CM | POA: Diagnosis not present

## 2023-09-12 DIAGNOSIS — T7840XD Allergy, unspecified, subsequent encounter: Secondary | ICD-10-CM | POA: Diagnosis not present

## 2023-09-12 NOTE — Patient Instructions (Addendum)
 Allergic reactions Etiology unclear.  Keep track of episodes.  Write down what you had done/eaten during flares.  Avoid mammalian meat - no beef, pork or lamb. Avoid NSAIDs - Aleve, Voltaren  Take tylenol for pain and fevers.   Take allegra 180mg  once a day as a preventative measure.  For mild symptoms you can take over the counter antihistamines such as Benadryl 1-2 tablets = 25-50mg  and monitor symptoms closely. If symptoms worsen or if you have severe symptoms including breathing issues, throat closure, significant swelling, whole body hives, severe diarrhea and vomiting, lightheadedness then inject epinephrine  and seek immediate medical care afterwards. Emergency action plan given. Get bloodwork We are ordering labs, so please allow 1-2 weeks for the results to come back. With the newly implemented Cures Act, the labs might be visible to you at the same time that they become visible to me. However, I will not address the results until all of the results are back, so please be patient.  In the meantime, continue recommendations in your patient instructions, including avoidance measures (if applicable), until you hear from me.  Follow up in 4 weeks or sooner if needed.

## 2023-09-14 ENCOUNTER — Telehealth: Payer: Self-pay | Admitting: Allergy

## 2023-09-14 NOTE — Telephone Encounter (Signed)
Error

## 2023-09-15 ENCOUNTER — Encounter: Payer: Self-pay | Admitting: Allergy

## 2023-09-19 ENCOUNTER — Ambulatory Visit (INDEPENDENT_AMBULATORY_CARE_PROVIDER_SITE_OTHER): Payer: Medicare Other | Admitting: Family Medicine

## 2023-09-19 ENCOUNTER — Encounter: Payer: Self-pay | Admitting: Family Medicine

## 2023-09-19 VITALS — BP 122/70 | Ht 68.0 in | Wt 187.0 lb

## 2023-09-19 DIAGNOSIS — M722 Plantar fascial fibromatosis: Secondary | ICD-10-CM

## 2023-09-19 NOTE — Patient Instructions (Signed)
 You have plantar fasciitis Take tylenol as needed for pain. Do home exercises as directed once a day.  Can do the stretches more frequently. Ice heel for 15 minutes as needed. Avoid flat shoes/barefoot walking as much as possible. Arch straps have been shown to help with pain. Inserts are important (dr. Jari Sportsman active series, spencos, our green insoles, custom orthotics). Consider [place] sock at nighttime. Physical therapy is also an option. Follow up with me in 6 weeks.

## 2023-09-19 NOTE — Progress Notes (Signed)
 PCP: Joselyn Arrow, MD  SUBJECTIVE:   HPI:  Patient is a 73 y.o. male here with chief complaint of left heel pain. Reports that one month ago started having sharp plantar heel pain when he takes a few steps out of bed in the middle of the night or morning when he is barefoot. When wearing shoes and walking around throughout the day he has pain inferior to lateral malleolus. He has been wearing a gel heel insert and taking tylenol for pain. He reports his pain is not worsening or improving.   He is unable to take NSAIDs due to a concern that he may have an allergy to NSAIDs for which he is following with allergy.   Pertinent ROS were reviewed with the patient and found to be negative unless otherwise specified above in HPI.   PERTINENT  PMH / PSH / FH / SH:  Past Medical, Surgical, Social, and Family History Reviewed & Updated in the EMR.  Pertinent findings include:    Past Medical History:  Diagnosis Date   Allergy    spring with pollen   Angio-edema    Cancer (HCC)    Basal cell cancer; followed annually by dermatology/[facility].   Cataract    forming   Diabetes (HCC)    Diverticulosis    Glucose intolerance (impaired glucose tolerance)    Gout 08/02/2012   Herpes labialis    Hyperlipidemia    Hypertension    Internal hemorrhoids    Squamous cell carcinoma of skin 2017   right arm, removed by Dr. [place]   Trigeminal neuralgia of left side of face    since childhood. controlled by tegretol (has had it on both sides in the past)   Tubular adenoma of colon    Urticaria     Current Outpatient Medications on File Prior to Visit  Medication Sig Dispense Refill   allopurinol (ZYLOPRIM) 300 MG tablet Take 1 tablet (300 mg total) by mouth daily. 90 tablet 3   atorvastatin (LIPITOR) 40 MG tablet Take 1 tablet (40 mg total) by mouth daily. 90 tablet 3   carbamazepine (TEGRETOL XR) 100 MG 12 hr tablet Take 1 tablet (100 mg total) by mouth daily. 90 tablet 1   cholecalciferol (VITAMIN  D) 1000 units tablet Take 1,000 Units by mouth daily.     CINNAMON PO Take by mouth daily.     EPINEPHrine (EPIPEN 2-PAK) 0.3 mg/0.3 mL IJ SOAJ injection Inject 0.3 mg into the muscle as needed for anaphylaxis. 2 each 0   fexofenadine (ALLEGRA) 180 MG tablet Take 180 mg by mouth daily.     losartan (COZAAR) 25 MG tablet Take 1 tablet (25 mg total) by mouth daily. 90 tablet 1   metFORMIN (GLUCOPHAGE-XR) 500 MG 24 hr tablet TAKE 2 TABLET BEFORE BREAKFAST, 2 TABLETS BEFORE DINNER 360 tablet 1   MILK THISTLE PO Take by mouth daily.      Multiple Vitamin (MULTIVITAMIN) tablet Take 1 tablet by mouth daily.     omega-3 acid ethyl esters (LOVAZA) 1 g capsule Take 2 capsules (2 g total) by mouth 2 (two) times daily. 360 capsule 3   XIIDRA 5 % SOLN Apply 1 drop to eye 2 (two) times daily.     No current facility-administered medications on file prior to visit.    Past Surgical History:  Procedure Laterality Date   CATARACT EXTRACTION Right 06/28/2023   Dr. Sallye Lat   CATARACT EXTRACTION Left 07/12/2023   Dr. Marchelle Gearing  COLONOSCOPY  11/01/2010   normal; repeat in 10 years.  [facility].    Allergies  Allergen Reactions   Minocycline Anaphylaxis   Nsaids    Penicillins Hives    OBJECTIVE:  BP 122/70   Ht 5\' 8"  (1.727 m)   Wt 187 lb (84.8 kg)   BMI 28.43 kg/m    MSK EXAM: Ankle/Foot, left: No visible erythema, swelling, ecchymosis, or bony deformity. Moderate collapse of long arch. Transverse arch grossly intact; Normal eversion/inversion stress testing. Range of motion is full in all directions. Strength is 5/5 in all directions. No tenderness at the insertion/body/myotendinous junction of the Achilles tendon; Tenderness of peroneal tendon tenderness inferiorly to lateral malleolus; No tenderness on posterior aspects of lateral and medial malleolus; Stable lateral and medial ligaments; Plantar calcaneal tenderness; No tenderness of base of the 5th MT; Able to walk 4  steps.  X-ray L foot 08/08/2019 FINDINGS: Posterior and plantar calcaneal spurs. No acute bony abnormality. Specifically, no fracture, subluxation, or dislocation. Assessment & Plan Plantar fasciitis, left One month of sharp pain at calcaneal plantar fascia attachment with first steps out of bed in the morning. When wearing shoes he has pain at peroneal tendons. On exam TTP of plantar fascia and peroneal tendons inferior to lateral malleolus. There is some collapse of his long arch. On review of L foot x-rays from 2021 he has a plantar calcaneal spur.  Overall suspect his pain is mostly due to plantar fascitis and likely some element of peroneal tendon strain. We discussed treatment options for plantar fascitis including arch binders, arch support inserts, strengthening exercises, [place] sock at night, and avoiding being barefoot to start. If no improvement can consider shockwave therapy. Additionally given some peroneal strengthening exercises. Will plan to avoid NSAIDs pending allergy team recommendations given concern for allergy. He will follow up in 6 weeks.  Micah Noel MD PGY2 [facility]

## 2023-09-20 LAB — ALPHA-GAL PANEL
Allergen Lamb IgE: <0.1 kU/L
Beef IgE: <0.1 kU/L
IgE (Immunoglobulin E), Serum: 1092 [IU]/mL — ABNORMAL HIGH (ref 6–495)
O215-IgE Alpha-Gal: <0.1 kU/L
Pork IgE: <0.1 kU/L

## 2023-09-20 LAB — CHRONIC URTICARIA: cu index: 2.1 (ref <10)

## 2023-09-20 LAB — FOOD ALLERGY PROFILE
Allergen Corn, IgE: <0.1 kU/L
Clam IgE: <0.1 kU/L
Codfish IgE: <0.1 kU/L
Egg White IgE: 0.23 kU/L — AB
Milk IgE: 0.19 kU/L — AB
Peanut IgE: <0.1 kU/L
Scallop IgE: <0.1 kU/L
Sesame Seed IgE: <0.1 kU/L
Shrimp IgE: <0.1 kU/L
Soybean IgE: <0.1 kU/L
Walnut IgE: <0.1 kU/L
Wheat IgE: 0.24 kU/L — AB

## 2023-09-20 LAB — THYROID CASCADE PROFILE: TSH: 0.637 u[IU]/mL (ref 0.450–4.500)

## 2023-09-20 LAB — C3 AND C4
Complement C3, Serum: 133 mg/dL (ref 82–167)
Complement C4, Serum: 18 mg/dL (ref 12–38)

## 2023-09-20 LAB — TRYPTASE: Tryptase: 5.6 ug/L (ref 2.2–13.2)

## 2023-09-20 LAB — ANTINUCLEAR ANTIBODIES, IFA: ANA Titer 1: NEGATIVE

## 2023-09-20 LAB — SEDIMENTATION RATE: Sed Rate: 6 mm/h (ref 0–30)

## 2023-09-21 ENCOUNTER — Encounter: Payer: Self-pay | Admitting: Allergy

## 2023-09-27 NOTE — Progress Notes (Unsigned)
 No chief complaint on file.  Brett Johnson is a 73 y.o. male who presents for annual wellness visit and follow-up on chronic medical conditions.  See below for recent labs.   He was seen earlier this month regarding further allergic reactions.  He was referred to allergist.  Allergy tests were borderline + to egg, milk and wheat. Negative for alpha gal, or other underlying issues (thyroid, autoimmune, mast cell eval, etc).  He was advised to monitor symptoms after eating eggs, milk, wheat, and to stop if having any issues. They were more concerned about NSAIDs triggering reactions, recommended avoiding them, using Tylenol for pain.  Diabetes:  A1c was 6.7% on metformin 2000 mg daily in September, higher at 7.4% on recent check. He reported that he eats healthy when home, not as good on vacation but tries. Doesn't check sugars on vacation. Eats more ice cream when with the grandkids.  He continues to have 2 glasses of wine most nights, and a cocktail before dinner a few nights/week. Less alcohol in the summer when with grandkids.   He has microalbuminuria. Serum Cr and eGFR have been normal He checks his feet regularly, denies concerns.    Diabetic eye exam had no retinopathy in 04/2023.   Component Ref Range & Units (hover) 2 wk ago (09/08/23) 5 mo ago (04/14/23) 1 yr ago (09/16/22) 1 yr ago (04/15/22) 2 yr ago (08/31/21) 2 yr ago (03/30/21) 2 yr ago (01/16/21)  Hgb A1c MFr Bld 7.4 High  6.7 Abnormal  R 7.2 Abnormal  R 7.0 High  CM 6.4 Abnormal  R 6.8 High  CM 6.5 R, CM      Hyperlipidemia:  Following a lowfat, low cholesterol diet. Tolerating atorvastatin and Lovaza without side effects, reports compliance.  Lipids were at goal on this regimen in September.  Lab Results  Component Value Date   CHOL 115 04/14/2023   HDL 55 04/14/2023   LDLCALC 41 04/14/2023   LDLDIRECT 126.5 02/18/2012   TRIG 106 04/14/2023   CHOLHDL 2.1 04/14/2023      Hypertension follow-up:  He is tolerating  losartan without side effects. Denies headaches, chest pain, edema.   Blood pressure has been running  ***   BP Readings from Last 3 Encounters:  09/19/23 122/70  09/12/23 108/80  09/08/23 132/84      Irregular heartbeat icon was previously noted frequently on his prior blood pressure monitor, and had abnormal EKG.  He saw Dr. Shari Prows in October 2023, Zio patch 06/2022 showed some NSVT and SVT, no atrial fib.  He declined metoprolol, as he wasn't having frequent palpitations. Echo at that time showed EF 55-60%, grade 1 diastolic dysfunction, moderately dilated L atrium, mild MR, mild AI, mild dilation of aortic root. He saw Dr. Flora Lipps in 06/2023.  Echo at that time showed normal EF (60-65%), moderate LVH, grade 1 diastolic dysfunction. Mild aortic regurgitation. Aortic dilation noted, borderline dilatation of aortic root. It was recommended to repeat echo in 2 years.     Gout--taking allopurinol. Denies any recent flares (prior flares had been in his toes, ankle). Can't recall the last flare. Uric acid was at goal on last check. Lab Results  Component Value Date   LABURIC 4.5 04/14/2023    Trigeminal neuralgia since childhood. Saw neuro in the past and put on tegretol. Weaned to every other day, which still controls it.  If he develops symptoms, he increases the dose back up to daily and also uses short-acting tegretol.  Vitamin D deficiency--s/p 12 weeks of prescription therapy in the past.  Last check was 34.0 in 08/2020, when taking MVI daily plus 1000 IU.  He is currently on same supplements Component Ref Range & Units (hover) 3 yr ago (08/27/20) 6 yr ago (09/08/17) 6 yr ago (01/05/17) 7 yr ago (08/31/16) 7 yr ago (04/15/16) 10 yr ago (11/13/12) 12 yr ago (10/22/10)  Vit D, 25-Hydroxy 34.0 28.0 Low  CM 32 R, CM 29 Low  R, CM 19 Low  R, CM 25 Low  R, CM 32 R, CM    ED--Sildenafil was effective in the past (at a dose of 4-5 tablets), didn't like the lack of spontaneity.   He reports  he continues to do well without the use of sildenafil. Hasn't needed it recently, still has meds at home.    Immunization History  Administered Date(s) Administered   Fluad Quad(high Dose 65+) 04/16/2019, 04/22/2020, 04/01/2021, 04/22/2022   Fluad Trivalent(High Dose 65+) 04/19/2023   Influenza Split 05/11/2012   Influenza, High Dose Seasonal PF 04/13/2017, 05/02/2018   Influenza,inj,Quad PF,6+ Mos 04/23/2013, 05/06/2014, 05/12/2015, 04/28/2016   PFIZER Comirnaty(Gray Top)Covid-19 Tri-Sucrose Vaccine 11/03/2020   PFIZER(Purple Top)SARS-COV-2 Vaccination 08/22/2019, 09/12/2019, 04/28/2020   PNEUMOCOCCAL CONJUGATE-20 09/16/2022   Pfizer Covid-19 Vaccine Bivalent Booster 80yrs & up 04/26/2021, 12/27/2021   Pfizer(Comirnaty)Fall Seasonal Vaccine 12 years and older 06/03/2022, 10/11/2022, 04/19/2023   Pneumococcal Conjugate-13 04/28/2016   Pneumococcal Polysaccharide-23 09/08/2017   Respiratory Syncytial Virus Vaccine,Recomb Aduvanted(Arexvy) 06/15/2022   Td 04/28/2016   Tdap 05/02/2006   Zoster Recombinant(Shingrix) 01/06/2017, 04/13/2017   Zoster, Live 03/05/2013   Last colonoscopy: 01/2021 with Dr. Vernia Buff (tubular adenomas), diverticulosis, internal hemorrhoids. 7 year f/u recommended Last PSA Lab Results  Component Value Date   PSA1 0.7 04/14/2023   PSA1 0.8 04/15/2022   PSA1 0.8 03/30/2021   PSA 0.6 04/15/2016   PSA 0.66 11/13/2014   PSA 0.69 01/02/2014  Dentist:every 6 months  Ophtho: yearly Exercise:    Walks daily 30-60 minutes, most days (weather-permitting, usually 45 mins).  Light weights and pushups occasionally at home. Swims in the summer only. Also recently started taking exercise classes at [facility].   Has been traveling a lot--always walks, has dumbbells at the beach and [place].  Patient Care Team: Joselyn Arrow, MD as PCP - General (Family Medicine) O'Neal, Ronnald Ramp, MD as PCP - Cardiology (Cardiology) Meryl Dare, MD (Inactive) as  Consulting Physician (Gastroenterology) Eugenia Mcalpine, MD (Inactive) as Consulting Physician (Orthopedic Surgery) Lenda Kelp, MD as Consulting Physician (Sports Medicine) Meriam Sprague, MD (Inactive) as Consulting Physician (Cardiology) Dentist: Dr. Rosanna Randy Ophtho: Dr. Marchelle Gearing for cataracts Derm: [facility] Allergist: Dr. Wyline Mood     04/14/2023   10:47 AM 09/16/2022    2:41 PM 08/31/2021   10:59 AM 08/27/2020   10:07 AM 03/31/2020   10:58 AM  Depression screen PHQ 2/9  Decreased Interest 0 0 0 0 0  Down, Depressed, Hopeless 0 0 0 0 0  PHQ - 2 Score 0 0 0 0 0     Falls screen:     04/14/2023   10:46 AM 09/16/2022    2:41 PM 04/22/2022   11:34 AM 08/31/2021   11:00 AM 04/01/2021   10:30 AM  Fall Risk   Falls in the past year? 0 0 0 0 0  Number falls in past yr: 0 0 0 0 0  Injury with Fall? 0 0 0 0 0  Risk for fall due to :  No Fall Risks No Fall Risks No Fall Risks No Fall Risks No Fall Risks  Follow up Falls evaluation completed Falls evaluation completed Falls evaluation completed Falls evaluation completed Falls evaluation completed     Functional Status Survey:          End of Life Discussion:  Patient has a living will and medical power of attorney, in chart.    PMH, PSH, SH and FH were reviewed and updated    ROS: The patient denies anorexia, fever, weight changes, headaches,  vision loss, decreased hearing, ear pain, chest pain, palpitations, dizziness, syncope, dyspnea on exertion, swelling, nausea, vomiting, diarrhea, constipation, abdominal pain, melena, hematochezia, indigestion/heartburn, hematuria, incontinence, weakened urine stream, dysuria, genital lesions, joint pains, numbness, weakness, suspicious skin lesions, depression, anxiety, abnormal bleeding/bruising, or enlarged lymph nodes. Slight tingling in feet when barefoot ED improved. Up at least 2-3 times to void at night, unchanged. Infrequent palpitations. Rare slightly dizzy  spells when voiding in the middle of the night, not recently. See HPI.   PHYSICAL EXAM:  There were no vitals taken for this visit.  Wt Readings from Last 3 Encounters:  09/19/23 187 lb (84.8 kg)  09/12/23 195 lb 1.6 oz (88.5 kg)  09/08/23 194 lb (88 kg)    General Appearance:    Alert, cooperative, no distress, appears stated age  Head:    Normocephalic, without obvious abnormality, atraumatic  Eyes:    PERRL, conjunctiva/corneas clear, EOM's intact, fundi benign  Ears:    Normal TM's and external ear canals  Nose:   No drainage or sinus tenderness  Throat:   Normal mucosa  Neck:   Supple, no lymphadenopathy;  thyroid:  no enlargement/tenderness/ nodules; no carotid bruit or JVD  Back:    Spine nontender, no curvature, ROM normal, no CVA tenderness  Lungs:     Clear to auscultation bilaterally without wheezes, rales or ronchi; respirations unlabored  Chest Wall:    No tenderness or deformity   Heart:    Regular rate and rhythm, S1 and S2 normal, no murmur, rub or gallop. Occasional ectopy noted (pt not aware)  Breast Exam:    No chest wall tenderness, masses or gynecomastia  Abdomen:     Soft, non-tender, nondistended, normoactive bowel sounds, no masses, no hepatosplenomegaly  Genitalia:    Normal male external genitalia without lesions. Testicles without masses. No inguinal hernias.  Rectal:    Normal sphincter tone, no masses or tenderness; guaiac negative stool.  Prostate smooth, no nodules, not enlarged.  Extremities:   No clubbing, cyanosis or edema.   Pulses:   2+ and symmetric all extremities  Skin:   Skin color, texture, turgor normal, no rashes. He has purpura on right forearm  Lymph nodes:   Cervical, supraclavicular, and inguinal nodes normal  Neurologic:   Normal strength, sensation and gait; reflexes 2+ and symmetric throughout. Normal monofilament exam                             Psych:   Normal mood, affect, hygiene and grooming.     Diabetic foot  exam--normal  ***DIABETIC FOOT EXAM Update if any ectopy  Lab Results  Component Value Date   HGBA1C 7.4 (H) 09/08/2023     Chemistry      Component Value Date/Time   NA 139 09/08/2023 1016   K 4.5 09/08/2023 1016   CL 99 09/08/2023 1016   CO2 21 09/08/2023 1016   BUN  17 09/08/2023 1016   CREATININE 0.92 09/08/2023 1016   CREATININE 0.90 01/05/2017 0757      Component Value Date/Time   CALCIUM 9.1 09/08/2023 1016   ALKPHOS 59 09/08/2023 1016   AST 24 09/08/2023 1016   ALT 38 09/08/2023 1016   BILITOT 0.6 09/08/2023 1016     Lab Results  Component Value Date   WBC 4.6 09/08/2023   HGB 14.7 09/08/2023   HCT 42.4 09/08/2023   MCV 95 09/08/2023   PLT 181 09/08/2023   Lab Results  Component Value Date   TSH 0.637 09/12/2023   Lab Results  Component Value Date   ESRSEDRATE 6 09/12/2023   Food allergy profile: Component Ref Range & Units (hover) 2 wk ago  Egg White IgE 0.23 Abnormal   Peanut IgE <0.10  Soybean IgE <0.10  Milk IgE 0.19 Abnormal   Clam IgE <0.10  Shrimp IgE <0.10  Walnut IgE <0.10  Codfish IgE <0.10  Scallop IgE <0.10  Wheat IgE 0.24 Abnormal   Allergen Corn, IgE <0.10  Sesame Seed IgE <0.10    ASSESSMENT/PLAN:  DIABETIC FOOT EXAM  Consider COVID booster mid-March (6 months from last)  Suboptimal control of DM-- Farxiga--to help protect kidneys?? Or Ozempic?   Discussed PSA screening (risks/benefits), recommended at least 30 minutes of aerobic activity at least 5 days/week, weight-bearing exercise at least 2x/week; proper sunscreen use reviewed; healthy diet and alcohol recommendations (less than or equal to 2 drinks/day) reviewed; regular seatbelt use; changing batteries in smoke detectors.. Immunization recommendations discussed--continue yearly high dose flu shots.  Discussed option for another COVID booster, 6 months from the last (mid-March) Colon cancer screening recommendations reviewed, due 01/2028  MOST form reviewed/updated.  Full Code, Full Care.    Medicare Attestation I have personally reviewed: The patient's medical and social history Their use of alcohol, tobacco or illicit drugs Their current medications and supplements The patient's functional ability including ADLs,fall risks, home safety risks, cognitive, and hearing and visual impairment Diet and physical activities Evidence for depression or mood disorders  The patient's weight, height, BMI, and visual acuity have been recorded in the chart.  I have made referrals, counseling, and provided education to the patient based on review of the above and I have provided the patient with a written personalized care plan for preventive services.

## 2023-09-27 NOTE — Patient Instructions (Incomplete)
  HEALTH MAINTENANCE RECOMMENDATIONS:  It is recommended that you get at least 30 minutes of aerobic exercise at least 5 days/week (for weight loss, you may need as much as 60-90 minutes). This can be any activity that gets your heart rate up. This can be divided in 10-15 minute intervals if needed, but try and build up your endurance at least once a week.  Weight bearing exercise is also recommended twice weekly.  Eat a healthy diet with lots of vegetables, fruits and fiber.  "Colorful" foods have a lot of vitamins (ie green vegetables, tomatoes, red peppers, etc).  Limit sweet tea, regular sodas and alcoholic beverages, all of which has a lot of calories and sugar.  Up to 2 alcoholic drinks daily may be beneficial for men (unless trying to lose weight, watch sugars).  Drink a lot of water.  Sunscreen of at least SPF 30 should be used on all sun-exposed parts of the skin when outside between the hours of 10 am and 4 pm (not just when at beach or pool, but even with exercise, golf, tennis, and yard work!)  Use a sunscreen that says "broad spectrum" so it covers both UVA and UVB rays, and make sure to reapply every 1-2 hours.  Remember to change the batteries in your smoke detectors when changing your clock times in the spring and fall.  Carbon monoxide detectors are recommended for your home.  Use your seat belt every time you are in a car, and please drive safely and not be distracted with cell phones and texting while driving.    Mr. Tosh , Thank you for taking time to come for your Medicare Wellness Visit. I appreciate your ongoing commitment to your health goals. Please review the following plan we discussed and let me know if I can assist you in the future.   This is a list of the screening recommended for you and due dates:  Health Maintenance  Topic Date Due   COVID-19 Vaccine (10 - 2024-25 season) 06/14/2023   Complete foot exam   09/17/2023   Hemoglobin A1C  03/07/2024   Yearly  kidney health urinalysis for diabetes  04/13/2024   Eye exam for diabetics  04/19/2024   Yearly kidney function blood test for diabetes  09/07/2024   Medicare Annual Wellness Visit  09/27/2024   DTaP/Tdap/Td vaccine (3 - Td or Tdap) 04/28/2026   Colon Cancer Screening  02/13/2028   Pneumonia Vaccine  Completed   Flu Shot  Completed   Hepatitis C Screening  Completed   Zoster (Shingles) Vaccine  Completed   HPV Vaccine  Aged Out   Ignore the COVID date above. Recommendation is for every 6 months, which would be mid-March for you (last booster was 04/19/23).  Start Farxiga 5 mg once daily, in addition to the metformin. This will help your sugars and your kidneys. Contact us if you don't tolerate this (frequent yeast infections, or other side effects).  If your fingers not being straight (due to the Dupuytren's contractures) are causing you any issues, I recommend seeing a hand doctor (Dr. Amanda Pea).

## 2023-09-28 ENCOUNTER — Ambulatory Visit: Payer: Medicare Other | Admitting: Family Medicine

## 2023-09-28 ENCOUNTER — Encounter: Payer: Self-pay | Admitting: Family Medicine

## 2023-09-28 VITALS — BP 130/80 | HR 72 | Ht 68.0 in | Wt 190.6 lb

## 2023-09-28 DIAGNOSIS — E1159 Type 2 diabetes mellitus with other circulatory complications: Secondary | ICD-10-CM

## 2023-09-28 DIAGNOSIS — M109 Gout, unspecified: Secondary | ICD-10-CM

## 2023-09-28 DIAGNOSIS — Z0000 Encounter for general adult medical examination without abnormal findings: Secondary | ICD-10-CM

## 2023-09-28 DIAGNOSIS — E1169 Type 2 diabetes mellitus with other specified complication: Secondary | ICD-10-CM

## 2023-09-28 DIAGNOSIS — G500 Trigeminal neuralgia: Secondary | ICD-10-CM

## 2023-09-28 DIAGNOSIS — E782 Mixed hyperlipidemia: Secondary | ICD-10-CM | POA: Diagnosis not present

## 2023-09-28 DIAGNOSIS — I499 Cardiac arrhythmia, unspecified: Secondary | ICD-10-CM | POA: Diagnosis not present

## 2023-09-28 DIAGNOSIS — I152 Hypertension secondary to endocrine disorders: Secondary | ICD-10-CM

## 2023-09-28 DIAGNOSIS — R809 Proteinuria, unspecified: Secondary | ICD-10-CM | POA: Diagnosis not present

## 2023-09-28 DIAGNOSIS — E559 Vitamin D deficiency, unspecified: Secondary | ICD-10-CM

## 2023-09-28 DIAGNOSIS — E1129 Type 2 diabetes mellitus with other diabetic kidney complication: Secondary | ICD-10-CM

## 2023-09-28 DIAGNOSIS — M720 Palmar fascial fibromatosis [Dupuytren]: Secondary | ICD-10-CM | POA: Diagnosis not present

## 2023-09-28 MED ORDER — DAPAGLIFLOZIN PROPANEDIOL 5 MG PO TABS: 5.0000 mg | ORAL_TABLET | Freq: Every day | ORAL | 2 refills | Status: DC

## 2023-09-29 DIAGNOSIS — M720 Palmar fascial fibromatosis [Dupuytren]: Secondary | ICD-10-CM | POA: Insufficient documentation

## 2023-09-29 DIAGNOSIS — I152 Hypertension secondary to endocrine disorders: Secondary | ICD-10-CM | POA: Insufficient documentation

## 2023-09-29 NOTE — Assessment & Plan Note (Signed)
 A1c above goal at 7.4, with persistent microalbuminuria, even when A1c <7. Start Farxiga--to help with sugars and protect kidneys. Consider Chauncey Mann in future if persistently elevated microalbumin.  Risks/SE of Farxiga reviewed. Proper diet/exercise reviewed

## 2023-09-29 NOTE — Assessment & Plan Note (Signed)
 Stable, continue current dosing of tegretol

## 2023-09-29 NOTE — Assessment & Plan Note (Signed)
 Continue allopuriinol

## 2023-09-29 NOTE — Assessment & Plan Note (Signed)
 Denies this being bothersome or affecting his ADL's (just has to modify when doing a plank).  He would plan to see Dr. Amanda Pea in the future if this became more bothersome, declines eval now.

## 2023-09-29 NOTE — Assessment & Plan Note (Signed)
 Continue supplements of 1000 IU D3 plus MVI daily

## 2023-09-29 NOTE — Assessment & Plan Note (Signed)
 BP controlled with losartan. Continue low Na diet, daily exercise, and weight loss encouraged

## 2023-09-29 NOTE — Assessment & Plan Note (Signed)
 Lipids at goal in September on regimen of atorvastatin and lovaza. Continue current meds, and lowfat, low cholesterol diet

## 2023-09-30 DIAGNOSIS — D225 Melanocytic nevi of trunk: Secondary | ICD-10-CM | POA: Diagnosis not present

## 2023-09-30 DIAGNOSIS — Z85828 Personal history of other malignant neoplasm of skin: Secondary | ICD-10-CM | POA: Diagnosis not present

## 2023-09-30 DIAGNOSIS — L570 Actinic keratosis: Secondary | ICD-10-CM | POA: Diagnosis not present

## 2023-09-30 DIAGNOSIS — L821 Other seborrheic keratosis: Secondary | ICD-10-CM | POA: Diagnosis not present

## 2023-10-05 ENCOUNTER — Other Ambulatory Visit: Payer: Self-pay | Admitting: Family Medicine

## 2023-10-05 DIAGNOSIS — E1129 Type 2 diabetes mellitus with other diabetic kidney complication: Secondary | ICD-10-CM

## 2023-10-07 ENCOUNTER — Other Ambulatory Visit: Payer: Self-pay | Admitting: Family Medicine

## 2023-10-07 DIAGNOSIS — E1129 Type 2 diabetes mellitus with other diabetic kidney complication: Secondary | ICD-10-CM

## 2023-10-09 NOTE — Progress Notes (Unsigned)
 Follow Up Note  RE: Brett Johnson MRN: [id number] DOB: 1951/02/13 Date of Office Visit: 10/10/2023  Referring provider: Joselyn Arrow, MD Primary care provider: Joselyn Arrow, MD  Chief Complaint: No chief complaint on file.  History of Present Illness: I had the pleasure of seeing Brett Johnson for a follow up visit at the [facility] on 10/09/2023. He is a 73 y.o. male, who is being followed for allergic reactions and multiple drug allergies. His previous allergy office visit was on 09/12/2023 with Dr. Selena Batten. Today is a regular follow up visit.  Discussed the use of AI scribe software for clinical note transcription with the patient, who gave verbal consent to proceed.  History of Present Illness            2025 labs: "Thyroid, autoimmune screener, inflammation markers, chronic urticaria index (checks for autoantibodies that trigger mast cells), tryptase (checks for mast cell issues) and alpha gal (checks for red meat allergy) were all normal which is great.    Borderline positive to egg, milk and wheat.   Okay to reintroduce red meat back into your diet.   Monitor symptoms after you eat egg, milk and wheat. If you have any issues then recommend to stop.    Based on these results and your past history, I'm very suspicious of the NSAID type of medications that may have triggered your allergic reactions.   I recommend that you avoid all NSAID type of medications. Don't use the Voltaren. Take Tylenol for pain. "  Assessment and Plan: Brett Johnson is a 73 y.o. male with: Allergic reaction, subsequent encounter Multiple drug allergies Episodes of generalized itching, labored breathing, and coughing with some episodes associated with lightheadedness, dizziness, and loss of consciousness. Episodes have been increasing in frequency and severity. No clear triggers identified. History of penicillin allergy and possible NSAID allergy. Passed aspirin challenge and tolerates  aspirin 81mg  daily with no issues however stopped taking it as it was no longer recommended. Unclear if he took NSAIDS during above episodes and 2 of the times there was mammalian meat exposure.  Etiology unclear but I'm concerned about NSAID allergy and/or alpha-gal allergy given clinical history. Keep track of episodes.  Write down what you had done/eaten during flares.  Avoid mammalian meat - no beef, pork or lamb. Avoid NSAIDs - Aleve, Voltaren Take tylenol for pain and fevers.  Take allegra 180mg  once a day as a preventative measure. For mild symptoms you can take over the counter antihistamines such as Benadryl 1-2 tablets = 25-50mg  and monitor symptoms closely. If symptoms worsen or if you have severe symptoms including breathing issues, throat closure, significant swelling, whole body hives, severe diarrhea and vomiting, lightheadedness then inject epinephrine and seek immediate medical care afterwards. Emergency action plan given. Get bloodwork.   Assessment and Plan              No follow-ups on file.  No orders of the defined types were placed in this encounter.  Lab Orders  No laboratory test(s) ordered today    Diagnostics: Spirometry:  Tracings reviewed. His effort: {Blank single:19197::"Good reproducible efforts.","It was hard to get consistent efforts and there is a question as to whether this reflects a maximal maneuver.","Poor effort, data can not be interpreted."} FVC: ***L FEV1: ***L, ***% predicted FEV1/FVC ratio: ***% Interpretation: {Blank single:19197::"Spirometry consistent with mild obstructive disease","Spirometry consistent with moderate obstructive disease","Spirometry consistent with severe obstructive disease","Spirometry consistent with possible restrictive disease","Spirometry consistent with mixed obstructive  and restrictive disease","Spirometry uninterpretable due to technique","Spirometry consistent with normal pattern","No overt abnormalities  noted given today's efforts"}.  Please see scanned spirometry results for details.  Skin Testing: {Blank single:19197::"Select foods","Environmental allergy panel","Environmental allergy panel and select foods","Food allergy panel","None","Deferred due to recent antihistamines use"}. *** Results discussed with patient/family.   Medication List:  Current Outpatient Medications  Medication Sig Dispense Refill   allopurinol (ZYLOPRIM) 300 MG tablet Take 1 tablet (300 mg total) by mouth daily. 90 tablet 3   atorvastatin (LIPITOR) 40 MG tablet Take 1 tablet (40 mg total) by mouth daily. 90 tablet 3   carbamazepine (TEGRETOL XR) 100 MG 12 hr tablet Take 1 tablet (100 mg total) by mouth daily. 90 tablet 1   carbamazepine (TEGRETOL) 100 MG chewable tablet Chew 100 mg by mouth as needed. (Patient not taking: Reported on 09/28/2023)     cholecalciferol (VITAMIN D) 1000 units tablet Take 1,000 Units by mouth daily.     CINNAMON PO Take by mouth daily.     dapagliflozin propanediol (FARXIGA) 5 MG TABS tablet Take 1 tablet (5 mg total) by mouth daily before breakfast. 30 tablet 2   EPINEPHrine (EPIPEN 2-PAK) 0.3 mg/0.3 mL IJ SOAJ injection Inject 0.3 mg into the muscle as needed for anaphylaxis. (Patient not taking: Reported on 09/28/2023) 2 each 0   fexofenadine (ALLEGRA) 180 MG tablet Take 180 mg by mouth daily.     losartan (COZAAR) 25 MG tablet TAKE 1 TABLET (25 MG TOTAL) BY MOUTH DAILY. 90 tablet 0   metFORMIN (GLUCOPHAGE-XR) 500 MG 24 hr tablet TAKE 2 TABLET BEFORE BREAKFAST, 2 TABLETS BEFORE DINNER 360 tablet 1   MILK THISTLE PO Take by mouth daily.      Multiple Vitamin (MULTIVITAMIN) tablet Take 1 tablet by mouth daily.     omega-3 acid ethyl esters (LOVAZA) 1 g capsule Take 2 capsules (2 g total) by mouth 2 (two) times daily. 360 capsule 3   XIIDRA 5 % SOLN Apply 1 drop to eye 2 (two) times daily.     No current facility-administered medications for this visit.   Allergies: Allergies   Allergen Reactions   Minocycline Anaphylaxis   Nsaids    Penicillins Hives   I reviewed his past medical history, social history, family history, and environmental history and no significant changes have been reported from his previous visit.  Review of Systems  Constitutional:  Negative for appetite change, chills, fever and unexpected weight change.  HENT:  Negative for congestion and rhinorrhea.   Eyes:  Negative for itching.  Respiratory:  Negative for cough, chest tightness, shortness of breath and wheezing.   Cardiovascular:  Negative for chest pain.  Gastrointestinal:  Negative for abdominal pain.  Genitourinary:  Negative for difficulty urinating.  Skin:  Negative for rash.  Neurological:  Negative for headaches.    Objective: There were no vitals taken for this visit. There is no height or weight on file to calculate BMI. Physical Exam Vitals and nursing note reviewed.  Constitutional:      Appearance: Normal appearance. He is well-developed.  HENT:     Head: Normocephalic and atraumatic.     Right Ear: Tympanic membrane and external ear normal.     Left Ear: Tympanic membrane and external ear normal.     Nose: Nose normal.     Mouth/Throat:     Mouth: Mucous membranes are moist.     Pharynx: Oropharynx is clear.  Eyes:     Conjunctiva/sclera: Conjunctivae normal.  Cardiovascular:  Rate and Rhythm: Normal rate and regular rhythm.     Heart sounds: Normal heart sounds. No murmur heard.    No friction rub. No gallop.  Pulmonary:     Effort: Pulmonary effort is normal.     Breath sounds: Normal breath sounds. No wheezing, rhonchi or rales.  Musculoskeletal:     Cervical back: Neck supple.  Skin:    General: Skin is warm.     Findings: No rash.  Neurological:     Mental Status: He is alert and oriented to person, place, and time.  Psychiatric:        Behavior: Behavior normal.    Previous notes and tests were reviewed. The plan was reviewed with the  patient/family, and all questions/concerned were addressed.  It was my pleasure to see Brett Johnson today and participate in his care. Please feel free to contact me with any questions or concerns.  Sincerely,  Wyline Mood, DO Allergy & Immunology  [facility] office: [phone] [facility] office: [phone]

## 2023-10-10 ENCOUNTER — Ambulatory Visit (INDEPENDENT_AMBULATORY_CARE_PROVIDER_SITE_OTHER): Payer: Medicare Other | Admitting: Allergy

## 2023-10-10 ENCOUNTER — Encounter: Payer: Self-pay | Admitting: Allergy

## 2023-10-10 ENCOUNTER — Other Ambulatory Visit: Payer: Self-pay

## 2023-10-10 VITALS — BP 138/68 | HR 70 | Temp 98.3°F

## 2023-10-10 DIAGNOSIS — T7840XD Allergy, unspecified, subsequent encounter: Secondary | ICD-10-CM | POA: Diagnosis not present

## 2023-10-10 DIAGNOSIS — Z886 Allergy status to analgesic agent status: Secondary | ICD-10-CM | POA: Diagnosis not present

## 2023-10-10 DIAGNOSIS — Z889 Allergy status to unspecified drugs, medicaments and biological substances status: Secondary | ICD-10-CM | POA: Diagnosis not present

## 2023-10-10 NOTE — Patient Instructions (Addendum)
 Allergic reactions Keep track of episodes.  Write down what you had done/eaten during flares.  No dietary restrictions.  Given you clinical history and lab results, I'm highly suspicious for NSAID allergy. Avoid NSAIDs - Aleve, Voltaren Take tylenol for pain and fevers.  See handout on NSAIDS. Sometimes people can take celebrex with no issues.  Given your past clinical history - if you want to undergo an NSAID challenge/desensitization I recommend that you do this at an academic institution such as [place], [place] or [place].  Stop allegra 180mg  and monitor symptoms.  For mild symptoms you can take over the counter antihistamines such as Benadryl 1-2 tablets = 25-50mg  and monitor symptoms closely. If symptoms worsen or if you have severe symptoms including breathing issues, throat closure, significant swelling, whole body hives, severe diarrhea and vomiting, lightheadedness then inject epinephrine and seek immediate medical care afterwards. Emergency action plan in place.   Follow up as needed.

## 2023-10-24 ENCOUNTER — Ambulatory Visit (INDEPENDENT_AMBULATORY_CARE_PROVIDER_SITE_OTHER): Payer: Medicare Other | Admitting: Family Medicine

## 2023-10-24 VITALS — BP 136/78 | Ht 68.0 in | Wt 185.0 lb

## 2023-10-24 DIAGNOSIS — M722 Plantar fascial fibromatosis: Secondary | ICD-10-CM

## 2023-10-25 ENCOUNTER — Encounter: Payer: Self-pay | Admitting: Family Medicine

## 2023-10-25 NOTE — Progress Notes (Signed)
 PCP: Joselyn Arrow, MD  Subjective:   HPI: Patient is a 73 y.o. male here for left heel pain.  2/17: Reports that one month ago started having sharp plantar heel pain when he takes a few steps out of bed in the middle of the night or morning when he is barefoot. When wearing shoes and walking around throughout the day he has pain inferior to lateral malleolus. He has been wearing a gel heel insert and taking tylenol for pain. He reports his pain is not worsening or improving.   He is unable to take NSAIDs due to a concern that he may have an allergy to NSAIDs for which he is following with allergy.   3/24: Patient reports he's doing better compared to last visit. Wearing binder, inserts, doing home exercises. Avoiding nsaids per his pcp. Sometimes not hurting at all.  Past Medical History:  Diagnosis Date   Allergy    spring with pollen   Angio-edema    Cancer (HCC)    Basal cell cancer; followed annually by dermatology/[facility].   Cataract    forming   Diabetes (HCC)    Diverticulosis    Glucose intolerance (impaired glucose tolerance)    Gout 08/02/2012   Herpes labialis    Hyperlipidemia    Hypertension    Internal hemorrhoids    Squamous cell carcinoma of skin 2017   right arm, removed by Dr. [place]   Trigeminal neuralgia of left side of face    since childhood. controlled by tegretol (has had it on both sides in the past)   Tubular adenoma of colon    Urticaria     Current Outpatient Medications on File Prior to Visit  Medication Sig Dispense Refill   allopurinol (ZYLOPRIM) 300 MG tablet Take 1 tablet (300 mg total) by mouth daily. 90 tablet 3   atorvastatin (LIPITOR) 40 MG tablet Take 1 tablet (40 mg total) by mouth daily. 90 tablet 3   carbamazepine (TEGRETOL XR) 100 MG 12 hr tablet Take 1 tablet (100 mg total) by mouth daily. 90 tablet 1   cholecalciferol (VITAMIN D) 1000 units tablet Take 1,000 Units by mouth daily.     CINNAMON PO Take by mouth daily.      dapagliflozin propanediol (FARXIGA) 5 MG TABS tablet Take 1 tablet (5 mg total) by mouth daily before breakfast. 30 tablet 2   EPINEPHrine (EPIPEN 2-PAK) 0.3 mg/0.3 mL IJ SOAJ injection Inject 0.3 mg into the muscle as needed for anaphylaxis. 2 each 0   losartan (COZAAR) 25 MG tablet TAKE 1 TABLET (25 MG TOTAL) BY MOUTH DAILY. 90 tablet 0   metFORMIN (GLUCOPHAGE-XR) 500 MG 24 hr tablet TAKE 2 TABLET BEFORE BREAKFAST, 2 TABLETS BEFORE DINNER 360 tablet 1   MILK THISTLE PO Take by mouth daily.      Multiple Vitamin (MULTIVITAMIN) tablet Take 1 tablet by mouth daily.     omega-3 acid ethyl esters (LOVAZA) 1 g capsule Take 2 capsules (2 g total) by mouth 2 (two) times daily. 360 capsule 3   XIIDRA 5 % SOLN Apply 1 drop to eye 2 (two) times daily.     No current facility-administered medications on file prior to visit.    Past Surgical History:  Procedure Laterality Date   CATARACT EXTRACTION Right 06/28/2023   Dr. Sallye Lat   CATARACT EXTRACTION Left 07/12/2023   Dr. Marchelle Gearing   COLONOSCOPY  11/01/2010   normal; repeat in 10 years.  [facility].    Allergies  Allergen Reactions   Minocycline Anaphylaxis   Nsaids    Penicillins Hives    BP 136/78   Ht 5\' 8"  (1.727 m)   Wt 185 lb (83.9 kg)   BMI 28.13 kg/m       No data to display              No data to display              Objective:  Physical Exam:  Gen: NAD, comfortable in exam room  Left foot/ankle: No gross deformity, swelling, ecchymoses Full range of motion Minimal tenderness to palpation proximal plantar fascia at insertion on calcaneus Negative calcaneal squeeze. NV intact distally.   Assessment & Plan:  1. Left plantar fasciitis - improved.  Continue home exercises, arch binder, arch support.  Follow up as needed.  Tylenol, topical medications only if needed.

## 2023-10-27 ENCOUNTER — Ambulatory Visit: Payer: Medicare Other | Admitting: Family Medicine

## 2023-12-27 NOTE — Progress Notes (Unsigned)
 No chief complaint on file.  Patient presents for 3 month follow-up.   Diabetes: A1c was up to 7.4% on last check in February.  He thought increase in sugars were likely contributed by less activity due to his heel pain (seeing Dr. Peggy Bowens for PF). He also thinks Christmas, New Year's and a week vacation in the [place] contributed.  Farxiga  5mg  was added to his 2000 mg of metformin  daily.    SE? Sugars? ***  Microalbuminuria: Serum Cr and eGFR have been normal. He is on losartan  25mg  daily, and farxiga  5mg  was added in 09/2023.  Component Ref Range & Units (hover) 8 mo ago (04/14/23) 1 yr ago (04/15/22) 2 yr ago (03/30/21) 3 yr ago (03/26/20) 4 yr ago (07/17/19) 4 yr ago (01/04/19) 6 yr ago (09/08/17)  Creatinine, Urine 127.9 124.5 78.2 64.1 165.2 39.1 208.4  Microalbumin, Urine 94.4 80.2 46.1 100.6 135.6 168.5 44.2  Microalb/Creat Ratio 74 High  64 High  CM 59 High  CM 157 High  CM 82 High  CM 431 High  CM 21.2 R    Hypertension follow-up:  He is tolerating losartan  without side effects. Denies headaches, chest pain, edema.   BP's at home ***   BP Readings from Last 3 Encounters:  10/24/23 136/78  10/10/23 138/68  09/28/23 130/80     PMH, PSH, SH reviewed   ROS: no f/c, URI symptoms, HA, dizziness, CP, shortness of breath, GI complaints. Denies significant palpitations.  Heel pain improved? Occ tinging in feet when barefoot.    PHYSICAL EXAM:  There were no vitals taken for this visit.  Wt Readings from Last 3 Encounters:  10/24/23 185 lb (83.9 kg)  09/28/23 190 lb 9.6 oz (86.5 kg)  09/19/23 187 lb (84.8 kg)       ASSESSMENT/PLAN:  A1c  Cont 5mg  farxiga  vs increase to 10 mg. If A1c at goal, consider rechecking urine microalb to determine if farxiga  dose should be increased (if tolerating it), vs adding Kerendia if still elevated.

## 2023-12-29 ENCOUNTER — Encounter: Payer: Self-pay | Admitting: Family Medicine

## 2023-12-29 ENCOUNTER — Ambulatory Visit (INDEPENDENT_AMBULATORY_CARE_PROVIDER_SITE_OTHER): Payer: Medicare Other | Admitting: Family Medicine

## 2023-12-29 VITALS — BP 130/78 | HR 64 | Ht 68.0 in | Wt 190.2 lb

## 2023-12-29 DIAGNOSIS — I152 Hypertension secondary to endocrine disorders: Secondary | ICD-10-CM

## 2023-12-29 DIAGNOSIS — R809 Proteinuria, unspecified: Secondary | ICD-10-CM

## 2023-12-29 DIAGNOSIS — E1129 Type 2 diabetes mellitus with other diabetic kidney complication: Secondary | ICD-10-CM

## 2023-12-29 DIAGNOSIS — E1159 Type 2 diabetes mellitus with other circulatory complications: Secondary | ICD-10-CM | POA: Diagnosis not present

## 2023-12-29 DIAGNOSIS — E1169 Type 2 diabetes mellitus with other specified complication: Secondary | ICD-10-CM

## 2023-12-29 LAB — POCT GLYCOSYLATED HEMOGLOBIN (HGB A1C): Hemoglobin A1C: 6.9 % — AB (ref 4.0–5.6)

## 2023-12-29 MED ORDER — DAPAGLIFLOZIN PROPANEDIOL 10 MG PO TABS: 10.0000 mg | ORAL_TABLET | Freq: Every day | ORAL | 2 refills | Status: DC

## 2024-01-03 ENCOUNTER — Other Ambulatory Visit: Payer: Self-pay | Admitting: Family Medicine

## 2024-01-03 DIAGNOSIS — E1129 Type 2 diabetes mellitus with other diabetic kidney complication: Secondary | ICD-10-CM

## 2024-01-03 DIAGNOSIS — G500 Trigeminal neuralgia: Secondary | ICD-10-CM

## 2024-03-25 ENCOUNTER — Other Ambulatory Visit: Payer: Self-pay | Admitting: Family Medicine

## 2024-03-25 DIAGNOSIS — E1129 Type 2 diabetes mellitus with other diabetic kidney complication: Secondary | ICD-10-CM

## 2024-03-27 DIAGNOSIS — H40013 Open angle with borderline findings, low risk, bilateral: Secondary | ICD-10-CM | POA: Diagnosis not present

## 2024-03-27 DIAGNOSIS — E119 Type 2 diabetes mellitus without complications: Secondary | ICD-10-CM | POA: Diagnosis not present

## 2024-03-27 DIAGNOSIS — H43393 Other vitreous opacities, bilateral: Secondary | ICD-10-CM | POA: Diagnosis not present

## 2024-03-27 LAB — HM DIABETES EYE EXAM

## 2024-03-29 ENCOUNTER — Other Ambulatory Visit: Payer: Self-pay | Admitting: Family Medicine

## 2024-03-29 DIAGNOSIS — E1129 Type 2 diabetes mellitus with other diabetic kidney complication: Secondary | ICD-10-CM

## 2024-03-30 ENCOUNTER — Telehealth: Payer: Self-pay

## 2024-03-30 NOTE — Telephone Encounter (Signed)
 Copied from CRM [id number]. Topic: Appointments - Scheduling Inquiry for Clinic >> Mar 30, 2024  3:55 PM Selinda RAMAN wrote: Reason for CRM: The patient called in to reschedule his med management and lab appointment for the end of September. He stated he only had September the 25th available to be seen and wanted to get the fasting labs drawn on the 24th. After checking I told him Dr Randol did not have any availability on the 25th and that the notes show she wanted him to come back in 3 months to have a med management fasting appointment that day. He was upset stating he talked with Dr Randol and Lucienne telling them he needs labs done the day before so he can talk with the doctor about it when he comes in. He said that is how he has always done it. He then asked to see if there were any appointments on October 2nd and I saw there were none yet again and he was even more frustrated stating he is busier in retirement than he was when he was working and he travels a lot so he is nor available a lot. He would like a call preferably from [place] to discuss getting labs done and working out one of those days to be seen. Either that or better yet to wait until March for the fasting labs when he has his physical.Please assist patient further.

## 2024-04-02 NOTE — Telephone Encounter (Signed)
 Advise pt that I'm off 10/2 due to the Jewish holiday, but can see him on the afternoon of 10/1--I do NOT need the hold that was placed for that afternoon (it can be removed). He can delay his fasting labs until March.  We can just do an A1c at his visit.  The lipids, uric acid, PSA, TSH etc can all wait until his physical if that is best for him. Truly only the lipid panel needs to be fasting, but the other labs can wait (unless he is having issues/symptoms).

## 2024-04-03 NOTE — Telephone Encounter (Signed)
/  Spoke with patient and if all that is needed is A1C he will keep 9/18 appt. He will schedule fasting lab appt when we see him in a few weeks to have labs prior in March.

## 2024-04-04 ENCOUNTER — Other Ambulatory Visit: Payer: Self-pay | Admitting: Family Medicine

## 2024-04-04 DIAGNOSIS — E1169 Type 2 diabetes mellitus with other specified complication: Secondary | ICD-10-CM

## 2024-04-04 DIAGNOSIS — M109 Gout, unspecified: Secondary | ICD-10-CM

## 2024-04-12 ENCOUNTER — Encounter: Admitting: Family Medicine

## 2024-04-18 NOTE — Progress Notes (Unsigned)
 No chief complaint on file.  Patient presents for follow-up on diabetes and other chronic issues.  Diabetes: A1c was up to 7.4% in 09/2023 (felt to be related to decreased activity related to plantar fasciitis, and related to dietary changes from the holidays and a vacation to the [place]). Farxiga  5 mg was added to his 2000 mg of metformin  daily, and A1c was down to 6.9% on last check in 12/2023. He denies side effects. Sugars have been running ***   Microalbuminuria: Serum Cr and eGFR have been normal. He is on losartan  25mg  daily, and farxiga  5mg  was added in 09/2023.    Hypertension follow-up:  He is tolerating losartan  without side effects. Denies headaches, chest pain, edema.    BP's are running  ***   BP Readings from Last 3 Encounters:  12/29/23 130/78  10/24/23 136/78  10/10/23 138/68     PMH, PSH, SH reviewed   ROS: no f/c, URI or allergy  symptoms. No HA, dizziness, CP, edema, SOB No urinary complaints, rashes, GU complaints. No n/v/d, abdominal pain, bowel changes. Moods are good.      PHYSICAL EXAM:  There were no vitals taken for this visit.  Wt Readings from Last 3 Encounters:  12/29/23 190 lb 3.2 oz (86.3 kg)  10/24/23 185 lb (83.9 kg)  09/28/23 190 lb 9.6 oz (86.5 kg)   Pleasant, well-appearing male, in good spirits HEENT: conjunctiva and sclera are clear, EOMI Neck: no lymphadenopathy or mass Heart: regular rate and rhythm Lungs: clear bilaterally Extremities: no edema Skin: no rashes, normal turgor Psych: normal mood, affect, hygiene and grooming   ASSESSMENT/PLAN:  A1c  Flu shot COVID from pharmacy  His CPE in March is in the morning. Does he want labs prior?  If so, please start future orders Cbc, c-met, TSH, PSA, uric acid, lipids

## 2024-04-19 ENCOUNTER — Ambulatory Visit (INDEPENDENT_AMBULATORY_CARE_PROVIDER_SITE_OTHER): Admitting: Family Medicine

## 2024-04-19 ENCOUNTER — Encounter: Payer: Self-pay | Admitting: Family Medicine

## 2024-04-19 VITALS — BP 130/80 | HR 64 | Ht 68.0 in | Wt 190.0 lb

## 2024-04-19 DIAGNOSIS — E1159 Type 2 diabetes mellitus with other circulatory complications: Secondary | ICD-10-CM

## 2024-04-19 DIAGNOSIS — R809 Proteinuria, unspecified: Secondary | ICD-10-CM | POA: Diagnosis not present

## 2024-04-19 DIAGNOSIS — E782 Mixed hyperlipidemia: Secondary | ICD-10-CM

## 2024-04-19 DIAGNOSIS — Z5181 Encounter for therapeutic drug level monitoring: Secondary | ICD-10-CM

## 2024-04-19 DIAGNOSIS — E1169 Type 2 diabetes mellitus with other specified complication: Secondary | ICD-10-CM | POA: Diagnosis not present

## 2024-04-19 DIAGNOSIS — B001 Herpesviral vesicular dermatitis: Secondary | ICD-10-CM

## 2024-04-19 DIAGNOSIS — Z23 Encounter for immunization: Secondary | ICD-10-CM

## 2024-04-19 DIAGNOSIS — I152 Hypertension secondary to endocrine disorders: Secondary | ICD-10-CM | POA: Diagnosis not present

## 2024-04-19 DIAGNOSIS — Z125 Encounter for screening for malignant neoplasm of prostate: Secondary | ICD-10-CM

## 2024-04-19 DIAGNOSIS — M109 Gout, unspecified: Secondary | ICD-10-CM | POA: Diagnosis not present

## 2024-04-19 DIAGNOSIS — E1129 Type 2 diabetes mellitus with other diabetic kidney complication: Secondary | ICD-10-CM | POA: Diagnosis not present

## 2024-04-19 LAB — POCT GLYCOSYLATED HEMOGLOBIN (HGB A1C): Hemoglobin A1C: 6.6 % — AB (ref 4.0–5.6)

## 2024-04-19 MED ORDER — VALACYCLOVIR HCL 1 G PO TABS: ORAL_TABLET | ORAL | 0 refills | Status: AC

## 2024-04-19 MED ORDER — OMEGA-3-ACID ETHYL ESTERS 1 G PO CAPS: 2.0000 | ORAL_CAPSULE | Freq: Two times a day (BID) | ORAL | 1 refills | Status: AC

## 2024-04-19 MED ORDER — DAPAGLIFLOZIN PROPANEDIOL 10 MG PO TABS
10.0000 mg | ORAL_TABLET | Freq: Every day | ORAL | 1 refills | Status: AC
Start: 2024-04-19 — End: ?

## 2024-04-19 MED ORDER — METFORMIN HCL ER 500 MG PO TB24
ORAL_TABLET | ORAL | 1 refills | Status: AC
Start: 2024-04-19 — End: ?

## 2024-04-20 ENCOUNTER — Ambulatory Visit: Payer: Self-pay | Admitting: Family Medicine

## 2024-04-20 LAB — MICROALBUMIN / CREATININE URINE RATIO
Creatinine, Urine: 34.5 mg/dL
Microalb/Creat Ratio: 34 mg/g{creat} — ABNORMAL HIGH (ref 0–29)
Microalbumin, Urine: 11.7 ug/mL

## 2024-04-25 ENCOUNTER — Encounter: Payer: Self-pay | Admitting: Family Medicine

## 2024-04-25 DIAGNOSIS — Z23 Encounter for immunization: Secondary | ICD-10-CM | POA: Diagnosis not present

## 2024-06-20 NOTE — Progress Notes (Unsigned)
 Cardiology Office Note:  .   Date:  06/21/2024  ID:  Brett Johnson, DOB 1951-02-23, MRN [id number] PCP: Randol Dawes, MD  [facility] Providers Cardiologist:  Darryle ONEIDA Decent, MD   History of Present Illness: .    Chief Complaint  Patient presents with   Follow-up    Brett Johnson is a 73 y.o. male with history of PAC/PVC, HTN, HLD, DM who presents for follow-up.   Discussed the use of AI scribe software for clinical note transcription with the patient, who gave verbal consent to proceed.  History of Present Illness   Brett Johnson is a 73 year old male with extra heartbeats and a leaky aortic valve who presents for follow-up. He was referred by Dr. Hobart for evaluation of his cardiovascular condition.  He is being followed for premature ventricular contractions (PVCs) and premature atrial contractions (PACs). No current symptoms such as palpitations, chest pain, or shortness of breath are present. He recalls a previous misdiagnosis of atrial fibrillation (AFib) during an urgent care visit, which was later clarified as extra heartbeats. Currently, he is asymptomatic.  He has a history of a aortic regurgitation, which was noted to be mild last year.  His cholesterol levels were last checked over a year ago, and he is on Lipitor 40 mg daily. He reports good control of his cholesterol levels in the past. He also takes fish oil supplements. He has not had his cholesterol checked this year due to scheduling conflicts and plans to have it done during his annual physical in March.  He has a history of diabetes with a recent A1c of 6.6, indicating good control. He maintains an active lifestyle, walking daily and attending exercise classes when possible. He splits his time between [place], [place], and [place], which sometimes affects his exercise routine.  He monitors his blood pressure at home, noting occasional high readings initially that tend  to normalize. He does not carry his blood pressure machine when traveling, so readings are taken primarily in [place]. His blood pressure is generally within the target range of 130/80 mmHg.  No chest pain, shortness of breath, or leg swelling. No recent changes in his health status.            Problem List PAC/PVC -PAC 5% burden; PVC 3.9% burden 06/2022 HTN Aortic regurgitation  -mild 06/2023 4. HLD -T chol 115, HDL 55, LDL 41, TG 106 5. DM -A1c 6.6    ROS: All other ROS reviewed and negative. Pertinent positives noted in the HPI.     Studies Reviewed: SABRA       TTE 06/09/2023  1. Left ventricular ejection fraction, by estimation, is 60 to 65%. Left  ventricular ejection fraction by 3D volume is 62 %. The left ventricle has  normal function. The left ventricle has no regional wall motion  abnormalities. There is moderate left  ventricular hypertrophy. Left ventricular diastolic parameters are  consistent with Grade I diastolic dysfunction (impaired relaxation). The  average left ventricular global longitudinal strain is -18.0 %. The global  longitudinal strain is normal.   2. Right ventricular systolic function is normal. The right ventricular  size is normal.   3. The mitral valve is abnormal. Trivial mitral valve regurgitation.   4. The aortic valve is tricuspid. Aortic valve regurgitation is mild.  Aortic valve sclerosis/calcification is present, without any evidence of  aortic stenosis. Aortic regurgitation PHT measures 511 msec.   5. Aortic dilatation noted.  There is borderline dilatation of the aortic  root, measuring 39 mm. There is borderline dilatation of the ascending  aorta, measuring 38 mm.  Physical Exam:   VS:  BP 135/75   Pulse 64   Ht 5' 8.5 (1.74 m)   Wt 188 lb (85.3 kg)   SpO2 95%   BMI 28.17 kg/m    Wt Readings from Last 3 Encounters:  06/21/24 188 lb (85.3 kg)  04/19/24 190 lb (86.2 kg)  12/29/23 190 lb 3.2 oz (86.3 kg)    GEN: Well  nourished, well developed in no acute distress NECK: No JVD; No carotid bruits CARDIAC: RRR, no murmurs, rubs, gallops RESPIRATORY:  Clear to auscultation without rales, wheezing or rhonchi  ABDOMEN: Soft, non-tender, non-distended EXTREMITIES:  No edema; No deformity  ASSESSMENT AND PLAN: .   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 Discussed potential progression to atrial fibrillation, currently stable. - Monitor for dyspnea, fatigue, tachycardia. - Schedule follow-up in one year with echocardiogram before appointment. - No symptoms. No need for treatment.   Aortic valve regurgitation, mild, asymptomatic Mild aortic valve regurgitation, asymptomatic, no hypertensive heart changes on echocardiogram. - Schedule echocardiogram before next year's appointment.  Type 2 diabetes mellitus, well controlled Type 2 diabetes mellitus well controlled, A1c 6.6%.  Mixed hyperlipidemia, well controlled Mixed hyperlipidemia well controlled, excellent cholesterol levels last year. - Perform cholesterol check during annual visit in March. - Continue current lipid-lowering therapy.              Follow-up: Return in about 1 year (around 06/21/2025).  Signed, Darryle DASEN. Barbaraann, MD, [facility]  [facility]  [address] [place], [place] [address] [phone]  9:13 AM

## 2024-06-21 ENCOUNTER — Ambulatory Visit (INDEPENDENT_AMBULATORY_CARE_PROVIDER_SITE_OTHER): Payer: Self-pay | Admitting: Cardiovascular Disease

## 2024-06-21 ENCOUNTER — Encounter: Payer: Self-pay | Admitting: Cardiovascular Disease

## 2024-06-21 ENCOUNTER — Ambulatory Visit
Admission: RE | Admit: 2024-06-21 | Discharge: 2024-06-21 | Disposition: A | Discharge status: Home / Self Care | Source: Ambulatory Visit | Attending: Cardiovascular Disease | Admitting: Cardiovascular Disease

## 2024-06-21 VITALS — BP 135/75 | HR 64 | Ht 68.5 in | Wt 188.0 lb

## 2024-06-21 DIAGNOSIS — I491 Atrial premature depolarization: Secondary | ICD-10-CM | POA: Diagnosis not present

## 2024-06-21 DIAGNOSIS — R002 Palpitations: Secondary | ICD-10-CM | POA: Insufficient documentation

## 2024-06-21 DIAGNOSIS — E782 Mixed hyperlipidemia: Secondary | ICD-10-CM | POA: Diagnosis not present

## 2024-06-21 DIAGNOSIS — I351 Nonrheumatic aortic (valve) insufficiency: Secondary | ICD-10-CM

## 2024-06-21 DIAGNOSIS — I493 Ventricular premature depolarization: Secondary | ICD-10-CM | POA: Diagnosis not present

## 2024-06-21 NOTE — Patient Instructions (Addendum)
 Medication Instructions:  Your physician recommends that you continue on your current medications as directed. Please refer to the Current Medication list given to you today. *If you need a refill on your cardiac medications before your next appointment, please call your pharmacy*  Lab Work: None Ordered If you have labs (blood work) drawn today and your tests are completely normal, you will receive your results only by: MyChart Message (if you have MyChart) OR A paper copy in the mail If you have any lab test that is abnormal or we need to change your treatment, we will call you to review the results.  Testing/Procedures: Complete a 2-4 weeks before next appt  Your physician has requested that you have an echocardiogram. Echocardiography is a painless test that uses sound waves to create images of your heart. It provides your doctor with information about the size and shape of your heart and how well your heart's chambers and valves are working. This procedure takes approximately one hour. There are no restrictions for this procedure. Please do NOT wear cologne, perfume, aftershave, or lotions (deodorant is allowed). Please arrive 15 minutes prior to your appointment time.  Please note: We ask at that you not bring children with you during ultrasound (echo/ vascular) testing. Due to room size and safety concerns, children are not allowed in the ultrasound rooms during exams. Our front office staff cannot provide observation of children in our lobby area while testing is being conducted. An adult accompanying a patient to their appointment will only be allowed in the ultrasound room at the discretion of the ultrasound technician under special circumstances. We apologize for any inconvenience.   We will order CT coronary calcium  score $99 at our [facility] in [place]  Please stop at the front desk to schedule the test  Second Floor [address]  [place], [place]  [address]  Follow-Up: At [facility], you and your health needs are our priority.  As part of our continuing mission to provide you with exceptional heart care, our providers are all part of one team.  This team includes your primary Cardiologist (physician) and Advanced Practice Providers or APPs (Physician Assistants and Nurse Practitioners) who all work together to provide you with the care you need, when you need it.  Your next appointment:   12 month(s)  Provider:   Darryle ONEIDA Decent, MD    We recommend signing up for the patient portal called MyChart.  Sign up information is provided on this After Visit Summary.  MyChart is used to connect with patients for Virtual Visits (Telemedicine).  Patients are able to view lab/test results, encounter notes, upcoming appointments, etc.  Non-urgent messages can be sent to your provider as well.   To learn more about what you can do with MyChart, go to [email or web address].   Other Instructions

## 2024-06-22 ENCOUNTER — Ambulatory Visit: Payer: Self-pay | Admitting: Cardiovascular Disease

## 2024-06-29 ENCOUNTER — Other Ambulatory Visit: Payer: Self-pay | Admitting: Family Medicine

## 2024-06-29 DIAGNOSIS — E1129 Type 2 diabetes mellitus with other diabetic kidney complication: Secondary | ICD-10-CM

## 2024-06-30 ENCOUNTER — Other Ambulatory Visit: Payer: Self-pay | Admitting: Family Medicine

## 2024-06-30 DIAGNOSIS — M109 Gout, unspecified: Secondary | ICD-10-CM

## 2024-06-30 DIAGNOSIS — E1169 Type 2 diabetes mellitus with other specified complication: Secondary | ICD-10-CM

## 2024-10-04 ENCOUNTER — Other Ambulatory Visit: Payer: Self-pay

## 2024-10-08 ENCOUNTER — Ambulatory Visit: Payer: Medicare Other | Admitting: Family Medicine
